# Patient Record
Sex: Female | Born: 1935
Health system: Southern US, Community
[De-identification: ages and names within clinical notes are randomized; demographics above are authoritative.]

## PROBLEM LIST (undated history)

## (undated) DIAGNOSIS — D849 Immunodeficiency, unspecified: Secondary | ICD-10-CM

## (undated) DIAGNOSIS — T8859XA Other complications of anesthesia, initial encounter: Secondary | ICD-10-CM

## (undated) DIAGNOSIS — C801 Malignant (primary) neoplasm, unspecified: Secondary | ICD-10-CM

## (undated) DIAGNOSIS — G7000 Myasthenia gravis without (acute) exacerbation: Secondary | ICD-10-CM

## (undated) DIAGNOSIS — I10 Essential (primary) hypertension: Secondary | ICD-10-CM

## (undated) DIAGNOSIS — M1990 Unspecified osteoarthritis, unspecified site: Secondary | ICD-10-CM

## (undated) DIAGNOSIS — T4145XA Adverse effect of unspecified anesthetic, initial encounter: Secondary | ICD-10-CM

## (undated) HISTORY — PX: BREAST SURGERY: SHX581

## (undated) HISTORY — PX: MASTECTOMY: SHX3

## (undated) HISTORY — PX: OTHER SURGICAL HISTORY: SHX169

## (undated) HISTORY — PX: HEMORRHOID SURGERY: SHX153

## (undated) HISTORY — PX: ABDOMINAL HYSTERECTOMY: SHX81

## (undated) HISTORY — PX: APPENDECTOMY: SHX54

## (undated) HISTORY — PX: EYE SURGERY: SHX253

---

## 1997-12-28 ENCOUNTER — Other Ambulatory Visit: Admission: RE | Admit: 1997-12-28 | Discharge: 1997-12-28 | Payer: Self-pay | Admitting: Hematology and Oncology

## 1998-06-10 ENCOUNTER — Ambulatory Visit (HOSPITAL_COMMUNITY): Admission: RE | Admit: 1998-06-10 | Discharge: 1998-06-10 | Payer: Self-pay | Admitting: Gastroenterology

## 2011-07-10 ENCOUNTER — Other Ambulatory Visit: Payer: Self-pay | Admitting: Family Medicine

## 2011-07-10 DIAGNOSIS — Z1231 Encounter for screening mammogram for malignant neoplasm of breast: Secondary | ICD-10-CM

## 2011-08-06 ENCOUNTER — Other Ambulatory Visit: Payer: Self-pay | Admitting: Family Medicine

## 2011-08-06 ENCOUNTER — Ambulatory Visit
Admission: RE | Admit: 2011-08-06 | Discharge: 2011-08-06 | Disposition: A | Discharge status: Home / Self Care | Payer: Medicare Other | Source: Ambulatory Visit | Attending: Family Medicine | Admitting: Family Medicine

## 2011-08-06 DIAGNOSIS — Z1231 Encounter for screening mammogram for malignant neoplasm of breast: Secondary | ICD-10-CM

## 2012-08-18 ENCOUNTER — Other Ambulatory Visit: Payer: Self-pay | Admitting: *Deleted

## 2012-08-18 ENCOUNTER — Other Ambulatory Visit: Payer: Self-pay | Admitting: Family Medicine

## 2012-08-18 DIAGNOSIS — Z1231 Encounter for screening mammogram for malignant neoplasm of breast: Secondary | ICD-10-CM

## 2012-09-29 ENCOUNTER — Ambulatory Visit: Payer: Medicare Other

## 2012-10-07 ENCOUNTER — Ambulatory Visit: Payer: Medicare Other

## 2012-10-29 ENCOUNTER — Ambulatory Visit: Payer: Medicare Other

## 2012-11-27 ENCOUNTER — Ambulatory Visit
Admission: RE | Admit: 2012-11-27 | Discharge: 2012-11-27 | Disposition: A | Discharge status: Home / Self Care | Payer: Medicare Other | Source: Ambulatory Visit | Attending: Family Medicine | Admitting: Family Medicine

## 2012-11-27 DIAGNOSIS — Z1231 Encounter for screening mammogram for malignant neoplasm of breast: Secondary | ICD-10-CM

## 2013-03-19 ENCOUNTER — Inpatient Hospital Stay (HOSPITAL_COMMUNITY)
Admission: EM | Admit: 2013-03-19 | Discharge: 2013-03-22 | DRG: 683 | Disposition: A | Discharge status: Home / Self Care | Payer: Medicare Other | Attending: Internal Medicine | Admitting: Internal Medicine

## 2013-03-19 ENCOUNTER — Encounter (HOSPITAL_COMMUNITY): Payer: Self-pay | Admitting: *Deleted

## 2013-03-19 DIAGNOSIS — R627 Adult failure to thrive: Secondary | ICD-10-CM | POA: Diagnosis present

## 2013-03-19 DIAGNOSIS — I10 Essential (primary) hypertension: Secondary | ICD-10-CM | POA: Diagnosis present

## 2013-03-19 DIAGNOSIS — R531 Weakness: Secondary | ICD-10-CM

## 2013-03-19 DIAGNOSIS — N390 Urinary tract infection, site not specified: Secondary | ICD-10-CM

## 2013-03-19 DIAGNOSIS — A088 Other specified intestinal infections: Secondary | ICD-10-CM | POA: Diagnosis present

## 2013-03-19 DIAGNOSIS — E860 Dehydration: Secondary | ICD-10-CM

## 2013-03-19 DIAGNOSIS — Z853 Personal history of malignant neoplasm of breast: Secondary | ICD-10-CM

## 2013-03-19 DIAGNOSIS — Z79899 Other long term (current) drug therapy: Secondary | ICD-10-CM

## 2013-03-19 DIAGNOSIS — N179 Acute kidney failure, unspecified: Secondary | ICD-10-CM

## 2013-03-19 DIAGNOSIS — G7000 Myasthenia gravis without (acute) exacerbation: Secondary | ICD-10-CM | POA: Diagnosis present

## 2013-03-19 HISTORY — DX: Essential (primary) hypertension: I10

## 2013-03-19 HISTORY — DX: Unspecified osteoarthritis, unspecified site: M19.90

## 2013-03-19 HISTORY — DX: Myasthenia gravis without (acute) exacerbation: G70.00

## 2013-03-19 HISTORY — DX: Immunodeficiency, unspecified: D84.9

## 2013-03-19 HISTORY — DX: Malignant (primary) neoplasm, unspecified: C80.1

## 2013-03-19 MED ORDER — SODIUM CHLORIDE 0.9 % IV BOLUS (SEPSIS)
1000.0000 mL | Freq: Once | INTRAVENOUS | Status: AC
  Administered 2013-03-20: 1000 mL via INTRAVENOUS

## 2013-03-19 MED ORDER — ONDANSETRON HCL 4 MG/2ML IJ SOLN
4.0000 mg | Freq: Once | INTRAMUSCULAR | Status: AC
  Administered 2013-03-20: 4 mg via INTRAVENOUS
  Filled 2013-03-19: qty 2

## 2013-03-19 NOTE — ED Notes (Signed)
Pt states she has been sick since Tuesday with nausea vomiting, weakness and she has had no care from pcp because it is getting worse today,

## 2013-03-20 ENCOUNTER — Encounter (HOSPITAL_COMMUNITY): Payer: Self-pay | Admitting: *Deleted

## 2013-03-20 ENCOUNTER — Inpatient Hospital Stay (HOSPITAL_COMMUNITY): Payer: Medicare Other

## 2013-03-20 DIAGNOSIS — E860 Dehydration: Secondary | ICD-10-CM

## 2013-03-20 DIAGNOSIS — R531 Weakness: Secondary | ICD-10-CM | POA: Diagnosis present

## 2013-03-20 DIAGNOSIS — N390 Urinary tract infection, site not specified: Secondary | ICD-10-CM

## 2013-03-20 DIAGNOSIS — N179 Acute kidney failure, unspecified: Secondary | ICD-10-CM | POA: Diagnosis present

## 2013-03-20 DIAGNOSIS — G7000 Myasthenia gravis without (acute) exacerbation: Secondary | ICD-10-CM | POA: Diagnosis present

## 2013-03-20 LAB — COMPREHENSIVE METABOLIC PANEL
ALT: 14 U/L (ref 0–35)
AST: 13 U/L (ref 0–37)
Albumin: 3.6 g/dL (ref 3.5–5.2)
Alkaline Phosphatase: 46 U/L (ref 39–117)
Chloride: 104 mEq/L (ref 96–112)
Potassium: 4.6 mEq/L (ref 3.5–5.1)
Total Bilirubin: 0.4 mg/dL (ref 0.3–1.2)

## 2013-03-20 LAB — URINALYSIS, ROUTINE W REFLEX MICROSCOPIC
Bilirubin Urine: NEGATIVE
Glucose, UA: 100 mg/dL — AB
Ketones, ur: NEGATIVE mg/dL
pH: 5.5 (ref 5.0–8.0)

## 2013-03-20 LAB — CBC WITH DIFFERENTIAL/PLATELET
Basophils Absolute: 0 10*3/uL (ref 0.0–0.1)
Basophils Relative: 0 % (ref 0–1)
Hemoglobin: 13.1 g/dL (ref 12.0–15.0)
MCHC: 33.9 g/dL (ref 30.0–36.0)
Neutro Abs: 6.7 10*3/uL (ref 1.7–7.7)
Neutrophils Relative %: 76 % (ref 43–77)
RDW: 13.3 % (ref 11.5–15.5)
WBC: 8.8 10*3/uL (ref 4.0–10.5)

## 2013-03-20 LAB — URINE MICROSCOPIC-ADD ON

## 2013-03-20 LAB — BASIC METABOLIC PANEL
CO2: 21 mEq/L (ref 19–32)
Calcium: 8.5 mg/dL (ref 8.4–10.5)
Chloride: 107 mEq/L (ref 96–112)
GFR calc Af Amer: 15 mL/min — ABNORMAL LOW (ref >90)
Sodium: 138 mEq/L (ref 135–145)

## 2013-03-20 LAB — PHOSPHORUS: Phosphorus: 4.6 mg/dL (ref 2.3–4.6)

## 2013-03-20 LAB — CLOSTRIDIUM DIFFICILE BY PCR: Toxigenic C. Difficile by PCR: NEGATIVE

## 2013-03-20 MED ORDER — LATANOPROST 0.005 % OP SOLN
1.0000 [drp] | Freq: Every day | OPHTHALMIC | Status: DC
  Administered 2013-03-20 – 2013-03-21 (×2): 1 [drp] via OPHTHALMIC
  Filled 2013-03-20: qty 2.5

## 2013-03-20 MED ORDER — ONDANSETRON HCL 4 MG/2ML IJ SOLN: 4.0000 mg | Freq: Four times a day (QID) | INTRAMUSCULAR | Status: DC | PRN

## 2013-03-20 MED ORDER — DEXTROSE 5 % IV SOLN
1.0000 g | Freq: Once | INTRAVENOUS | Status: AC
  Administered 2013-03-20: 1 g via INTRAVENOUS
  Filled 2013-03-20: qty 10

## 2013-03-20 MED ORDER — BIOTENE DRY MOUTH MT LIQD
15.0000 mL | Freq: Two times a day (BID) | OROMUCOSAL | Status: DC
  Administered 2013-03-20 – 2013-03-22 (×4): 15 mL via OROMUCOSAL

## 2013-03-20 MED ORDER — AMLODIPINE BESYLATE 5 MG PO TABS
5.0000 mg | ORAL_TABLET | Freq: Every day | ORAL | Status: DC
  Administered 2013-03-21 – 2013-03-22 (×2): 5 mg via ORAL
  Filled 2013-03-20 (×3): qty 1

## 2013-03-20 MED ORDER — DEXTROSE 5 % IV SOLN
1.0000 g | INTRAVENOUS | Status: AC
  Administered 2013-03-21: 1 g via INTRAVENOUS
  Filled 2013-03-20: qty 10

## 2013-03-20 MED ORDER — VITAMIN D3 25 MCG (1000 UNIT) PO TABS
1000.0000 [IU] | ORAL_TABLET | Freq: Every day | ORAL | Status: DC
  Administered 2013-03-20 – 2013-03-22 (×3): 1000 [IU] via ORAL
  Filled 2013-03-20 (×3): qty 1

## 2013-03-20 MED ORDER — DICLOFENAC SODIUM 1 % TD GEL
2.0000 g | Freq: Every day | TRANSDERMAL | Status: DC | PRN
  Filled 2013-03-20: qty 100

## 2013-03-20 MED ORDER — PYRIDOSTIGMINE BROMIDE 60 MG PO TABS
90.0000 mg | ORAL_TABLET | Freq: Two times a day (BID) | ORAL | Status: DC
  Administered 2013-03-20 – 2013-03-22 (×5): 90 mg via ORAL
  Filled 2013-03-20 (×6): qty 1.5

## 2013-03-20 MED ORDER — SODIUM CHLORIDE 0.9 % IV SOLN
INTRAVENOUS | Status: DC
  Administered 2013-03-20: 12:00:00 via INTRAVENOUS

## 2013-03-20 MED ORDER — ATORVASTATIN CALCIUM 20 MG PO TABS
20.0000 mg | ORAL_TABLET | Freq: Every day | ORAL | Status: DC
  Administered 2013-03-20 – 2013-03-21 (×2): 20 mg via ORAL
  Filled 2013-03-20 (×4): qty 1

## 2013-03-20 MED ORDER — OXYBUTYNIN CHLORIDE 5 MG PO TABS
5.0000 mg | ORAL_TABLET | Freq: Two times a day (BID) | ORAL | Status: DC
  Administered 2013-03-20 – 2013-03-22 (×5): 5 mg via ORAL
  Filled 2013-03-20 (×6): qty 1

## 2013-03-20 MED ORDER — SODIUM CHLORIDE 0.9 % IV BOLUS (SEPSIS)
500.0000 mL | Freq: Once | INTRAVENOUS | Status: AC
  Administered 2013-03-20: 500 mL via INTRAVENOUS

## 2013-03-20 MED ORDER — SODIUM CHLORIDE 0.9 % IJ SOLN
3.0000 mL | Freq: Two times a day (BID) | INTRAMUSCULAR | Status: DC
  Administered 2013-03-20 – 2013-03-22 (×4): 3 mL via INTRAVENOUS

## 2013-03-20 MED ORDER — SIMVASTATIN 40 MG PO TABS
40.0000 mg | ORAL_TABLET | Freq: Every evening | ORAL | Status: DC
  Filled 2013-03-20: qty 1

## 2013-03-20 MED ORDER — ASPIRIN EC 81 MG PO TBEC
81.0000 mg | DELAYED_RELEASE_TABLET | Freq: Every day | ORAL | Status: DC
  Administered 2013-03-20 – 2013-03-22 (×3): 81 mg via ORAL
  Filled 2013-03-20 (×3): qty 1

## 2013-03-20 MED ORDER — ONDANSETRON HCL 4 MG PO TABS: 4.0000 mg | ORAL_TABLET | Freq: Four times a day (QID) | ORAL | Status: DC | PRN

## 2013-03-20 MED ORDER — SENNOSIDES-DOCUSATE SODIUM 8.6-50 MG PO TABS
1.0000 | ORAL_TABLET | ORAL | Status: DC
  Administered 2013-03-22: 1 via ORAL
  Filled 2013-03-20 (×2): qty 1

## 2013-03-20 MED ORDER — CALCIUM CARBONATE ANTACID 500 MG PO CHEW
1.0000 | CHEWABLE_TABLET | Freq: Every evening | ORAL | Status: DC
  Administered 2013-03-20 – 2013-03-21 (×2): 200 mg via ORAL
  Filled 2013-03-20 (×4): qty 1

## 2013-03-20 MED ORDER — ENOXAPARIN SODIUM 30 MG/0.3ML ~~LOC~~ SOLN
30.0000 mg | SUBCUTANEOUS | Status: DC
  Administered 2013-03-20 – 2013-03-22 (×3): 30 mg via SUBCUTANEOUS
  Filled 2013-03-20 (×3): qty 0.3

## 2013-03-20 MED ORDER — PANTOPRAZOLE SODIUM 40 MG PO TBEC
40.0000 mg | DELAYED_RELEASE_TABLET | Freq: Every day | ORAL | Status: DC
  Administered 2013-03-20 – 2013-03-22 (×3): 40 mg via ORAL
  Filled 2013-03-20 (×3): qty 1

## 2013-03-20 NOTE — Care Management Note (Signed)
CARE MANAGEMENT NOTE 03/20/2013  Patient:  Elaine Owens, Elaine Owens   Account Number:  [id number]  Date Initiated:  03/20/2013  Documentation initiated by:  Trystan Akhtar  Subjective/Objective Assessment:   79 yo female admitted with UTI & ARF.     Action/Plan:   Home when stable   Anticipated DC Date:     Anticipated DC Plan:  HOME/SELF CARE      DC Planning Services  CM consult      Choice offered to / List presented to:     DME arranged  NA      DME agency  NA     HH arranged  NA      HH agency  NA   Status of service:  In process, will continue to follow Medicare Important Message given?   (If response is "NO", the following Medicare IM given date fields will be blank) Date Medicare IM given:   Date Additional Medicare IM given:    Discharge Disposition:    Per UR Regulation:  Reviewed for med. necessity/level of care/duration of stay  If discussed at Long Length of Stay Meetings, dates discussed:    Comments:  03/20/13 1200 Cori Henningsen,RN,BSN [phone] chart reviewed for utilization of services. No needs identified at this time.

## 2013-03-20 NOTE — H&P (Addendum)
[facility] History and Physical  Karleigh Bunte YNW:[id number] DOB: [birth year] DOA: 03/19/2013  Referring physician: ED physician PCP: No primary provider on file.   Chief Complaint: Generalized weakness   HPI:  Pt is 79 yo female with PMH outlined below who presents to [facility] ED with main concern of progressively worsening generalized weakness that initially started several weeks prior to admission associated with poor oral intake. Over the past two days she has started to experience nausea and non bloody vomiting, several episodes of non bloody diarrhea, generalized abdominal discomfort. Pt denies any specific alleviating or aggravating factors, no similar events in the past. She explains her abdominal discomfort feels like craps, its intermittent and 5/10 in severity when present, non radiating, associated with subjective fevers and chill. She denies chest pain or shortness of breath, no specific focal neurological symptoms, no other systemic symptoms.   In ED, pt appears stable but frail, UA suggestive of UTI and electrolyte panel with what appears to be be acute renal failure with Cr > 3. We have no other available record in EPIC system to determine baseline. TR[facility] asked to admit to medical floor for further evaluation.   Assessment and Plan:  Principal Problem:   Generalized weakness - this is likely multifactorial and secondary to possible viral gastroenteritis, UTI, deconditioning and failure to thrive  - will admit pt to medical floor for further evaluation - wil provide supportive care with IVF, analgesia and antiemetics as needed - provide ABX for empiric treatment of UTI and follow up on urine culture  - PT evaluation  Active Problems:   Nausea and vomiting, abdominal pain - possibly related to viral gastroenteritis, no flank pain to suggest pyelonephritis but its certainly possible  - treatment supportive as noted above, IVF, antiemetics and analgesia    Acute renal  failure - unclear etiology if this is entirely new problem or pt has chronic kidney disease - could be related to pre renal etiology and secondary to vomiting and poor oral intake  - will proceed with renal US, urine sodium and urine creatinine - will start IVF as noted above and repeat BMP in AM   Myasthenia gravis - continue pyridostigmine    UTI (urinary tract infection) - Rocephin for now and follow up on urine culture   Code Status: Full Family Communication: No family at bedside  Disposition Plan: Admit to telemetry bed   Review of Systems:  Constitutional: Positive  for fever, chills and malaise/fatigue. Negative for diaphoresis.  HENT: Negative for hearing loss, ear pain, nosebleeds, congestion, sore throat, neck pain, tinnitus and ear discharge.   Eyes: Negative for blurred vision, double vision, photophobia, pain, discharge and redness.  Respiratory: Negative for cough, hemoptysis, sputum production, shortness of breath, wheezing and stridor.   Cardiovascular: Negative for chest pain, palpitations, orthopnea, claudication and leg swelling.  Gastrointestinal: Positive for nausea, vomiting and abdominal pain. Negative for heartburn, constipation, blood in stool and melena.  Genitourinary: Negative for dysuria, urgency, frequency, hematuria and flank pain.  Musculoskeletal: Negative for myalgias, joint pain and falls.  Skin: Negative for itching and rash.  Neurological: Negative for tingling, tremors, sensory change, speech change, focal weakness, loss of consciousness and headaches.  Endo/Heme/Allergies: Negative for environmental allergies and polydipsia. Does not bruise/bleed easily.  Psychiatric/Behavioral: Negative for suicidal ideas. The patient is not nervous/anxious.      Past Medical History  Diagnosis Date  . Arthritis   . Cancer   . Hypertension   . Immune deficiency disorder  Past Surgical History  Procedure Laterality Date  . Masectomny    . Breast  surgery    . Appendectomy    . Eye surgery      lens implant  . Mastectomy      Social History:  reports that she has never smoked. She does not have any smokeless tobacco history on file. She reports that she does not drink alcohol. Her drug history is not on file.  Allergies  Allergen Reactions  . Ciprofloxacin Other (See Comments)    Can not take if patient has myastenia gravis  . Doxycycline Hyclate Other (See Comments)    Patient preference  . Morphine And Related Nausea And Vomiting    Mad her extremely sick    No family history of cancers.   Prior to Admission medications   Medication Sig Start Date End Date Taking? Authorizing Provider  alendronate (FOSAMAX) 70 MG tablet Take 70 mg by mouth every 7 (seven) days. Sunday. Take with a full glass of water on an empty stomach.   Yes Historical Provider, MD  amLODipine (NORVASC) 5 MG tablet Take 5 mg by mouth daily.   Yes Historical Provider, MD  aspirin EC 81 MG tablet Take 81 mg by mouth daily.   Yes Historical Provider, MD  calcium carbonate (TUMS - DOSED IN MG ELEMENTAL CALCIUM) 500 MG chewable tablet Chew 1 tablet by mouth every evening.   Yes Historical Provider, MD  cholecalciferol (VITAMIN D) 1000 UNITS tablet Take 1,000 Units by mouth daily.   Yes Historical Provider, MD  cyanocobalamin (,VITAMIN B-12,) 1000 MCG/ML injection Inject 1,000 mcg into the muscle every 30 (thirty) days.   Yes Historical Provider, MD  diclofenac sodium (VOLTAREN) 1 % GEL Apply 2 g topically daily as needed. For pain   Yes Historical Provider, MD  DiphenhydrAMINE HCl, Sleep, (ZZZQUIL PO) Take 1 capsule by mouth at bedtime as needed. For sleep   Yes Historical Provider, MD  latanoprost (XALATAN) 0.005 % ophthalmic solution Place 1 drop into both eyes at bedtime.   Yes Historical Provider, MD  omega-3 acid ethyl esters (LOVAZA) 1 G capsule Take 1 g by mouth daily.   Yes Historical Provider, MD  omeprazole (PRILOSEC) 20 MG capsule Take 20 mg by mouth  daily.   Yes Historical Provider, MD  oxybutynin (DITROPAN) 5 MG tablet Take 5 mg by mouth 2 (two) times daily.   Yes Historical Provider, MD  pyridostigmine (MESTINON) 60 MG tablet Take 90 mg by mouth 2 (two) times daily.   Yes Historical Provider, MD  senna-docusate (SENNA PLUS) 8.6-50 MG per tablet Take 1 tablet by mouth every other day.   Yes Historical Provider, MD  simvastatin (ZOCOR) 40 MG tablet Take 40 mg by mouth every evening.   Yes Historical Provider, MD  valACYclovir (VALTREX) 1000 MG tablet Take 1,000 mg by mouth every evening.   Yes Historical Provider, MD    Physical Exam: Filed Vitals:   03/20/13 0021 03/20/13 0023 03/20/13 0024 03/20/13 0516  BP: 135/51 144/55 144/55 128/48  Pulse: 60 70 69 54  Temp:      TempSrc:      Resp:    18  Height:      Weight:      SpO2:    95%    Physical Exam  Constitutional: Appears to be stable and in no acute distress HENT: Normocephalic. External right and left ear normal. Dry MM Eyes: Conjunctivae and EOM are normal. PERRLA, no scleral icterus.  Neck:  Normal ROM. Neck supple. No JVD. No tracheal deviation. No thyromegaly.  CVS: RRR, S1/S2 +, no murmurs, no gallops, no carotid bruit.  Pulmonary: Effort and breath sounds normal, decreased breath sounds at bases  Abdominal: Soft. BS +,  no distension, tenderness, rebound or guarding.  Musculoskeletal: Normal range of motion. No edema and no tenderness.  Lymphadenopathy: No lymphadenopathy noted, cervical, inguinal. Neuro: Alert. Normal reflexes, muscle tone coordination. No cranial nerve deficit. Skin: Skin is warm and dry. No rash noted. Not diaphoretic. No erythema. No pallor.  Psychiatric: Normal mood and affect.   Labs on Admission:  Basic Metabolic Panel:  Recent Labs Lab 03/20/13 0100  NA 139  K 4.6  CL 104  CO2 23  GLUCOSE 126*  BUN 35*  CREATININE 3.56*  CALCIUM 10.0   Liver Function Tests:  Recent Labs Lab 03/20/13 0100  AST 13  ALT 14  ALKPHOS 46   BILITOT 0.4  PROT 7.0  ALBUMIN 3.6    Recent Labs Lab 03/20/13 0100  LIPASE 32   CBC:  Recent Labs Lab 03/20/13 0100  WBC 8.8  NEUTROABS 6.7  HGB 13.1  HCT 38.6  MCV 92.3  PLT 194   Radiological Exams on Admission: No results found.  EKG: Normal sinus rhythm, no ST/T wave changes  Debbora Presto, MD  [facility] Pager [phone]  If 7PM-7AM, please contact night-coverage [email or web address] Password [email or web address] 03/20/2013, 6:06 AM

## 2013-03-20 NOTE — ED Provider Notes (Signed)
History    CSN: [id number] Arrival date & time 03/19/13  2241  First MD Initiated Contact with Patient 03/19/13 2343     Chief Complaint  Patient presents with  . Emesis  . Weakness  . Dizziness    Patient is a 79 y.o. female presenting with weakness. The history is provided by the patient.  Weakness This is a new problem. The current episode started 2 days ago. The problem occurs constantly. The problem has been gradually worsening. Associated symptoms include abdominal pain. Pertinent negatives include no chest pain and no shortness of breath. Nothing aggravates the symptoms. Nothing relieves the symptoms. She has tried rest for the symptoms. The treatment provided mild relief.  pt reports she has had nonbloody vomiting/diarrhea for past 2 days She also reports fatigue, mild abdominal cramping, and back pain No focal weakness No syncope No cp/sob No melena or BRBPR She reports mild HA No fever at home  She reports h/o myasthenia gravis but no recent issues with this Past Medical History  Diagnosis Date  . Arthritis   . Cancer   . Hypertension   . Immune deficiency disorder    Past Surgical History  Procedure Laterality Date  . Masectomny    . Breast surgery    . Appendectomy    . Eye surgery      lens implant  . Mastectomy     History reviewed. No pertinent family history. History  Substance Use Topics  . Smoking status: Never Smoker   . Smokeless tobacco: Not on file  . Alcohol Use: No   OB History   Grav Para Term Preterm Abortions TAB SAB Ect Mult Living                 Review of Systems  Constitutional: Positive for fatigue. Negative for fever.  HENT: Negative for trouble swallowing.   Respiratory: Negative for cough and shortness of breath.   Cardiovascular: Negative for chest pain.  Gastrointestinal: Positive for abdominal pain. Negative for blood in stool.  Genitourinary: Positive for dysuria.  Musculoskeletal: Positive for back pain.   Neurological: Positive for weakness. Negative for syncope.  All other systems reviewed and are negative.    Allergies  Doxycycline hyclate and Morphine and related  Home Medications  No current outpatient prescriptions on file. BP 148/65  Pulse 69  Temp(Src) 99 F (37.2 C) (Oral)  Resp 16  Ht 5\' 2"  (1.575 m)  Wt 137 lb 2 oz (62.199 kg)  BMI 25.07 kg/m2  SpO2 96% Physical Exam CONSTITUTIONAL: Well developed/well nourished HEAD: Normocephalic/atraumatic EYES: EOMI/PERRL ENMT: Mucous membranes dry NECK: supple no meningeal signs SPINE:entire spine nontender CV: S1/S2 noted, no murmurs/rubs/gallops noted LUNGS: Lungs are clear to auscultation bilaterally, no apparent distress ABDOMEN: soft, nontender, no rebound or guarding GU:no cva tenderness NEURO: Pt is awake/alert, moves all extremitiesx4, no arm or leg drift No facial droop.  No ptosis noted EXTREMITIES: pulses normal, full ROM SKIN: warm, color normal PSYCH: no abnormalities of mood noted  ED Course  Procedures Labs Reviewed  CBC WITH DIFFERENTIAL  COMPREHENSIVE METABOLIC PANEL  LIPASE, BLOOD  URINALYSIS, ROUTINE W REFLEX MICROSCOPIC   12:51 AM Pt here for vomiting, diarrhea and generalized weaknesss She appears mildly dehydrated but no other acute issue at this time Will rehydrate, check labs and reassess 4:06 AM Acute renal failure and UTI noted Will admit Pt has no known h/o renal failure Likely due to recent volume loss from GI illness D/w dr Izola Price, will admit  MDM  Nursing notes including past medical history and social history reviewed and considered in documentation Labs/vital reviewed and considered     Date: 03/20/2013  Rate: 60  Rhythm: normal sinus rhythm  QRS Axis: left  Intervals: normal  ST/T Wave abnormalities: nonspecific ST changes  Conduction Disutrbances:none      Joya Gaskins, MD 03/20/13 [id number]

## 2013-03-20 NOTE — Progress Notes (Signed)
[facility] PROGRESS NOTE  Elaine Owens ZOX:[id number] DOB: 25-Jul-1926 DOA: 03/19/2013 PCP: No primary provider on file.  Assessment/Plan: Principal Problem:   Generalized weakness Active Problems:   Acute renal failure   Myasthenia gravis   UTI (urinary tract infection)    1. Generalized weakness: Patient presented with progressive generalized weakness, and was found to have dehydration, ARF and a UTI, in the setting of known myasthenia gravis, all, likely contributory to a multifactorial etiology. For details of specific management, of contributory conditions, see below. PT/OT input requested.  2. Acute Gastroenteritis: Over the prior 2 days, patient has had nausea, vomiting, abdominal pain, without antecedent ingestion of unusual foods, clustering of cases, sick contacts, recent antibiotic therapy or exotic travel. Managing with iv fluids, antiemetics, and she has had no vomiting since admission. As a matter of fact, tolerated regular diet this morning. She still has loose stools. Suspect a viral etiology, but will check stool for C. Difficile PCR. Lipase is normal at 32.  3. Acute renal failure: Creatinine was elevated at 3.56 , BUN 35 at presentation, which given recent poor oral intake and GI losses, is likely pre-renal, due to dehydration and volume depletion. Managing with iv fluids and following renal indices. Renal U./S is pending. Baseline creatinine is unknown.  4. UTI (urinary tract infection): U/A revealed a positive sediment, with pyuria and bacteriuria, consistent with UTI. Patient is afebrile, and wcc is normal. Managing with iv Rocephin, now day # 2. Urine culture is pending.  5. Myasthenia gravis: Appears stable on Pyridostigmine   Code Status: Full Code.  Family Communication:  Disposition Plan: To be determined.    Brief narrative: 79 yo female with PMH of Myasthenia Gravis, OA, HTN, breast cancer, s/p mastectomy, s/p lens implant, s/p appendectomy, who  presents to [facility] ED with progressively worsening generalized weakness that initially started several weeks prior to admission, associated with poor oral intake. Over the past two days she has had nausea, vomiting, several episodes of non-bloody diarrhea and generalized abdominal discomfort. Pt denies any specific alleviating or aggravating factors, and has had no similar events in the past. She explains her abdominal discomfort feels like craps, it, is intermittent and 5/10 in severity when present, non radiating, associated with subjective fevers and chill. She denies chest pain or shortness of breath, no specific focal neurological symptoms, no other systemic symptoms.  In ED, pt appeared stable but frail, U/A suggestive of UTI and electrolyte panel revealed acute renal failure with Cr > 3 (baseline creatinine is unknown). Admitted for further evaluation and management.    Consultants:  N/A.   Procedures:  N/A.   Antibiotics:  Rocephin 03/19/13>>>  HPI/Subjective: Feels much better. No vomiting or abdominal pain. Stool is loose. Tolerated breakfast.   Objective: Vital signs in last 24 hours: Temp:  [98.1 F (36.7 C)-99 F (37.2 C)] 98.1 F (36.7 C) (07/04 0609) Pulse Rate:  [54-70] 66 (07/04 0615) Resp:  [16-18] 18 (07/04 0609) BP: (90-148)/(48-65) 90/64 mmHg (07/04 0609) SpO2:  [95 %-98 %] 98 % (07/04 0609) Weight:  [62.199 kg (137 lb 2 oz)-66.134 kg (145 lb 12.8 oz)] 66.134 kg (145 lb 12.8 oz) (07/04 0609) Weight change:  Last BM Date: 03/19/13  Intake/Output from previous day:       Physical Exam: General: Comfortable, alert, communicative, fully oriented, not short of breath at rest.  HEENT:  No clinical pallor, no jaundice, no conjunctival injection or discharge. Hydration is fair.  NECK:  Supple, JVP not seen, no  carotid bruits, no palpable lymphadenopathy, no palpable goiter. CHEST:  Clinically clear to auscultation, no wheezes, no crackles. HEART:  Sounds 1 and 2  heard, normal, regular, no murmurs. ABDOMEN:  Full, soft, non-tender, no palpable organomegaly, no palpable masses, normal bowel sounds. GENITALIA:  Not examined. LOWER EXTREMITIES:  No pitting edema, palpable peripheral pulses. MUSCULOSKELETAL SYSTEM:  Generalized osteoarthritic changes, otherwise, normal. CENTRAL NERVOUS SYSTEM:  No focal neurologic deficit on gross examination.  Lab Results:  Recent Labs  03/20/13 0100  WBC 8.8  HGB 13.1  HCT 38.6  PLT 194    Recent Labs  03/20/13 0100  NA 139  K 4.6  CL 104  CO2 23  GLUCOSE 126*  BUN 35*  CREATININE 3.56*  CALCIUM 10.0   No results found for this or any previous visit (from the past 240 hour(s)).   Studies/Results: No results found.  Medications: Scheduled Meds: . amLODipine  5 mg Oral Daily  . aspirin EC  81 mg Oral Daily  . calcium carbonate  1 tablet Oral QPM  . [START ON 03/21/2013] cefTRIAXone (ROCEPHIN)  IV  1 g Intravenous Q24H  . cholecalciferol  1,000 Units Oral Daily  . enoxaparin (LOVENOX) injection  30 mg Subcutaneous Q24H  . latanoprost  1 drop Both Eyes QHS  . oxybutynin  5 mg Oral BID  . pantoprazole  40 mg Oral Daily  . pyridostigmine  90 mg Oral BID  . senna-docusate  1 tablet Oral QODAY  . simvastatin  40 mg Oral QPM  . sodium chloride  3 mL Intravenous Q12H   Continuous Infusions: . sodium chloride 75 mL/hr at 03/20/13 0615   PRN Meds:.diclofenac sodium, ondansetron (ZOFRAN) IV, ondansetron    LOS: 1 day   Elaine Owens,CHRISTOPHER  [facility] Pager [phone]. If 8PM-8AM, please contact night-coverage at [email or web address], password [facility] 03/20/2013, 7:52 AM  LOS: 1 day

## 2013-03-20 NOTE — Evaluation (Signed)
Occupational Therapy Evaluation Patient Details Name: Elaine Owens MRN: [id number] DOB: 1926/04/24 Today's Date: 03/20/2013 Time: 9562-1308 OT Time Calculation (min): 14 min  OT Assessment / Plan / Recommendation History of present illness admitted with generalized weakness, FTT, possible viral gastroenteritis.  H/o breast CA, immune deficiency and myastenia gravis   Clinical Impression   Pt was admitted with the above conditions.  She will benefit from skilled OT in acute with modified independent level goals.  Pt is independent at baseline.  She lives with her daughter and will have intermittent supervision/assistance.    OT Assessment  Patient needs continued OT Services    Follow Up Recommendations  No OT follow up    Barriers to Discharge      Equipment Recommendations  None recommended by OT    Recommendations for Other Services    Frequency  Min 2X/week    Precautions / Restrictions Precautions Precautions: Fall Restrictions Weight Bearing Restrictions: No   Pertinent Vitals/Pain No c/o pain    ADL  Grooming: Teeth care;Modified independent Where Assessed - Grooming: Unsupported standing Upper Body Bathing: Set up Where Assessed - Upper Body Bathing: Unsupported sitting Lower Body Bathing: Supervision/safety Where Assessed - Lower Body Bathing: Unsupported sit to stand Upper Body Dressing: Set up Where Assessed - Upper Body Dressing: Unsupported sitting Lower Body Dressing: Supervision/safety Where Assessed - Lower Body Dressing: Unsupported sit to stand Toilet Transfer: Min Pension scheme manager Method: Sit to Barista: Comfort height toilet Toileting - Clothing Manipulation and Hygiene: Supervision/safety Where Assessed - Engineer, mining and Hygiene: Sit to stand from 3-in-1 or toilet Equipment Used:  (used IV pole for support out of bathroom) Transfers/Ambulation Related to ADLs: ambulated to bathroom with min  guard.  Used IV pole on way back and more steady.  Pt just feels a little deconditioned but feels better than yesterday ADL Comments: began energy conservation education    OT Diagnosis: Generalized weakness  OT Problem List: Decreased strength;Decreased activity tolerance;Impaired balance (sitting and/or standing) OT Treatment Interventions: Self-care/ADL training;Energy conservation;Patient/family education;Balance training   OT Goals(Current goals can be found in the care plan section) Acute Rehab OT Goals Patient Stated Goal: get back to being independent OT Goal Formulation: With patient Time For Goal Achievement: 04/03/13 Potential to Achieve Goals: Good ADL Goals Pt Will Transfer to Toilet: with modified independence;ambulating;regular height toilet Pt Will Perform Tub/Shower Transfer: with supervision;ambulating;shower seat;Tub transfer;grab bars Additional ADL Goal #1: Pt will gather clothes at mod I level Additional ADL Goal #2: Pt will verbalize 3 energy conservation strategies  Visit Information  Last OT Received On: 03/20/13 Assistance Needed: +1 History of Present Illness: admitted with generalized weakness, FTT, possible viral gastroenteritis.  H/o breast CA, immune deficiency and myastenia gravis       Prior Functioning     Home Living Family/patient expects to be discharged to:: Private residence Living Arrangements: Children Available Help at Discharge: Family;Available PRN/intermittently Type of Home: House Home Access: Level entry Home Layout: Two level;Bed/bath upstairs Alternate Level Stairs-Number of Steps: 12 Alternate Level Stairs-Rails: Right Home Equipment: Cane - single point;Shower seat (canes from husband) Prior Function Level of Independence: Independent Comments: she does some cooking; daughter does some Communication Communication: No difficulties         Vision/Perception     Cognition  Cognition Arousal/Alertness:  Awake/alert Behavior During Therapy: WFL for tasks assessed/performed Overall Cognitive Status: Within Functional Limits for tasks assessed    Extremity/Trunk Assessment Upper Extremity Assessment Upper Extremity  Assessment: Overall WFL for tasks assessed (pt has a h/o myastenia gravis)     Mobility Bed Mobility Bed Mobility: Supine to Sit Supine to Sit: 6: Modified independent (Device/Increase time);HOB elevated Transfers Transfers: Sit to Stand Sit to Stand: 5: Supervision     Exercise     Balance Balance Balance Assessed: Yes Dynamic Standing Balance Dynamic Standing - Balance Support: No upper extremity supported Dynamic Standing - Level of Assistance: 5: Stand by assistance Dynamic Standing - Balance Activities:  (ambulating to bathroom)   End of Session OT - End of Session Activity Tolerance: Patient tolerated treatment well Patient left: in chair;with bed alarm set;with family/visitor present  GO     [name] 03/20/2013, 2:25 PM Marica Otter, OTR/L [phone] 03/20/2013

## 2013-03-20 NOTE — Progress Notes (Addendum)
Patient admitted to room 1315 from ED. Denies nausea. "feeling better" per patient. Oriented to room and unit. Safety video in progress. Paged MD about order for telemetry. (orders just showed up in computer). Dr Izola Price to d/c orders. Not needed.

## 2013-03-20 NOTE — ED Notes (Signed)
Labs accidentally clicked off at this time. RN made aware that blood work was not obtained.

## 2013-03-20 NOTE — Progress Notes (Signed)
Patient requested LW/HCPOA papers. Packet given.

## 2013-03-21 DIAGNOSIS — R5381 Other malaise: Secondary | ICD-10-CM

## 2013-03-21 DIAGNOSIS — G7000 Myasthenia gravis without (acute) exacerbation: Secondary | ICD-10-CM

## 2013-03-21 DIAGNOSIS — R5383 Other fatigue: Secondary | ICD-10-CM

## 2013-03-21 LAB — CBC
MCV: 94.2 fL (ref 78.0–100.0)
Platelets: 150 10*3/uL (ref 150–400)
RBC: 3.99 MIL/uL (ref 3.87–5.11)
RDW: 13.6 % (ref 11.5–15.5)
WBC: 7.4 10*3/uL (ref 4.0–10.5)

## 2013-03-21 LAB — BASIC METABOLIC PANEL WITH GFR
BUN: 34 mg/dL — ABNORMAL HIGH (ref 6–23)
CO2: 19 meq/L (ref 19–32)
Calcium: 8.8 mg/dL (ref 8.4–10.5)
Chloride: 111 meq/L (ref 96–112)
Creatinine, Ser: 3.06 mg/dL — ABNORMAL HIGH (ref 0.50–1.10)
GFR calc Af Amer: 15 mL/min — ABNORMAL LOW
GFR calc non Af Amer: 13 mL/min — ABNORMAL LOW
Glucose, Bld: 92 mg/dL (ref 70–99)
Potassium: 4.4 meq/L (ref 3.5–5.1)
Sodium: 140 meq/L (ref 135–145)

## 2013-03-21 MED ORDER — CEPHALEXIN 500 MG PO CAPS
500.0000 mg | ORAL_CAPSULE | Freq: Two times a day (BID) | ORAL | Status: DC
  Administered 2013-03-22: 500 mg via ORAL
  Filled 2013-03-21 (×2): qty 1

## 2013-03-21 NOTE — Evaluation (Signed)
Physical Therapy Evaluation Patient Details Name: Elaine Owens MRN: [id number] DOB: June 08, 1926 Today's Date: 03/21/2013 Time: 4098-1191 PT Time Calculation (min): 19 min  PT Assessment / Plan / Recommendation History of Present Illness  admitted with generalized weakness, FTT, possible viral gastroenteritis.  H/o breast CA, immune deficiency and myastenia gravis  Clinical Impression  Pt ambulating in halls and performing all mobility tasks without assist.  Pt able to walk bkwd, side step and single leg stance with min difficulty.  Pt reports at premorbid level of function at this time and hoping for d/c home in am.  Pt has no further PT needs at this time.    PT Assessment  Patent does not need any further PT services    Follow Up Recommendations  No PT follow up    Does the patient have the potential to tolerate intense rehabilitation      Barriers to Discharge        Equipment Recommendations  None recommended by PT    Recommendations for Other Services     Frequency      Precautions / Restrictions Precautions Precautions: Fall Restrictions Weight Bearing Restrictions: No   Pertinent Vitals/Pain No pain reported      Mobility  Bed Mobility Bed Mobility: Supine to Sit Supine to Sit: 7: Independent Transfers Transfers: Sit to Stand;Stand to Sit Sit to Stand: 6: Modified independent (Device/Increase time) Stand to Sit: 6: Modified independent (Device/Increase time) Ambulation/Gait Ambulation/Gait Assistance: 5: Supervision Ambulation Distance (Feet): 300 Feet Assistive device: None Ambulation/Gait Assistance Details: Min instability noted - pt states she generally wears shoes. Gait Pattern: Within Functional Limits Stairs: No    Exercises     PT Diagnosis:    PT Problem List:   PT Treatment Interventions:       PT Goals(Current goals can be found in the care plan section) Acute Rehab PT Goals Patient Stated Goal: get back to being  independent  Visit Information  Last PT Received On: 03/21/13 Assistance Needed: +1 History of Present Illness: admitted with generalized weakness, FTT, possible viral gastroenteritis.  H/o breast CA, immune deficiency and myastenia gravis       Prior Functioning  Home Living Family/patient expects to be discharged to:: Private residence Living Arrangements: Children Available Help at Discharge: Family;Available PRN/intermittently Type of Home: House Home Access: Level entry Home Layout: Two level;Bed/bath upstairs Alternate Level Stairs-Number of Steps: 12 Alternate Level Stairs-Rails: Right Home Equipment: Cane - single point;Shower seat Prior Function Level of Independence: Independent Comments: she does some cooking; daughter does some; very active Communication Communication: No difficulties    Cognition  Cognition Arousal/Alertness: Awake/alert Behavior During Therapy: WFL for tasks assessed/performed Overall Cognitive Status: Within Functional Limits for tasks assessed    Extremity/Trunk Assessment Upper Extremity Assessment Upper Extremity Assessment: Overall WFL for tasks assessed Lower Extremity Assessment Lower Extremity Assessment: Overall WFL for tasks assessed Cervical / Trunk Assessment Cervical / Trunk Assessment: Normal   Balance Balance Balance Assessed: Yes Static Standing Balance Static Standing - Balance Support: No upper extremity supported Static Standing - Level of Assistance: 5: Stand by assistance Static Standing - Comment/# of Minutes: 1 Single Leg Stance - Right Leg: 10 Single Leg Stance - Left Leg: 10 Rhomberg - Eyes Opened: 1 Rhomberg - Eyes Closed: 1  End of Session PT - End of Session Equipment Utilized During Treatment: Gait belt Activity Tolerance: Patient tolerated treatment well Patient left: in chair;with call bell/phone within reach Nurse Communication: Mobility status  GP  Elaine Owens 03/21/2013, 3:40 PM

## 2013-03-21 NOTE — Discharge Summary (Signed)
Physician Discharge Summary  Elaine Owens ZOX:[id number] DOB: December 18, 1925 DOA: 03/19/2013  PCP: No primary provider on file.  Admit date: 03/19/2013 Discharge date: 03/22/2013  Time spent: 40 minutes  Recommendations for Outpatient Follow-up:  1. Follow up with primary MD. PMD is Dr Hope Budds, at [place], [place].  2. Primary MD to arrange nephrology referral, at her own discretion, if indicated.  3. Valtrex has been discontinued in view of ARF, until re-evaluated by PMD.   Discharge Diagnoses:  Principal Problem:   Generalized weakness Active Problems:   Acute renal failure   Myasthenia gravis   UTI (urinary tract infection)   Discharge Condition: Satisfactory.   Diet recommendation: Regular.   Filed Weights   03/19/13 2258 03/20/13 0609 03/22/13 0500  Weight: 62.199 kg (137 lb 2 oz) 66.134 kg (145 lb 12.8 oz) 64.774 kg (142 lb 12.8 oz)    History of present illness:  79 yo female with PMH of Myasthenia Gravis, OA, HTN, breast cancer, s/p mastectomy, s/p lens implant, s/p appendectomy, who presents to [facility] ED with progressively worsening generalized weakness that initially started several weeks prior to admission, associated with poor oral intake. Over the past two days she has had nausea, vomiting, several episodes of non-bloody diarrhea and generalized abdominal discomfort. Pt denies any specific alleviating or aggravating factors, and has had no similar events in the past. She explains her abdominal discomfort feels like craps, it, is intermittent and 5/10 in severity when present, non radiating, associated with subjective fevers and chill. She denies chest pain or shortness of breath, no specific focal neurological symptoms, no other systemic symptoms.  In ED, pt appeared stable but frail, U/A suggestive of UTI and electrolyte panel revealed acute renal failure with Cr > 3 (baseline creatinine is unknown). Admitted for further evaluation and management.    Hospital Course:   1. Generalized weakness: Patient presented with progressive generalized weakness, and was found to have dehydration, ARF and a UTI, in the setting of known myasthenia gravis, all, likely contributory to a multifactorial etiology. For details of specific management, of contributory conditions, see below. PT/OT evaluation has identified no acute needs.  2. Acute Gastroenteritis: Over the 2 days prior to admission, patient had nausea, vomiting, abdominal pain, without antecedent ingestion of unusual foods, clustering of cases, sick contacts, recent antibiotic therapy or exotic travel. Managed with iv fluidsn and antiemetics, with satisfactory clinical response. She had no further vomiting since admission, and although there were some loose stools in AM 03/20/13, diarrhea resolved, and patient was asymptomatic as of 03/21/13. C. Difficile PCR was negative. Lipase was normal at 32. This appears to have been a self-limited viral gastroenteritis.  3. Acute renal failure: Creatinine was elevated at 3.56, BUN 35 at presentation, which given recent poor oral intake and GI losses, was likely pre-renal in etiology, due to dehydration and volume depletion. Renal U/S was negative. Baseline creatinine is unknown. Managed with iv fluids, with improvement, and as of 03/22/13, creatinine was 2.02. Perhaps, this is probably patient's true baseline. Patient will need close follow up of renal indices by PMD at discharge, as well as a nephrology referral. Valtrex had been discontinued till re-evaluated by PMD.  4. UTI (urinary tract infection): U/A revealed a positive sediment, with pyuria and bacteriuria, consistent with UTI. Managed with iv Rocephin, patient remained afebrile, and wcc, normal throughout her hospitalization. Urine culture remained negative, and she was transitioned to oral Keflex effective 03/22/13, to conclude antibiotic course on 03/25/13. 5. Myasthenia gravis: Stable on Pyridostigmine  Procedures:  See Below.    Consultations:  N/A.   Discharge Exam: Filed Vitals:   03/21/13 0415 03/21/13 1330 03/21/13 2100 03/22/13 0500  BP: 131/49 116/50 130/54 128/60  Pulse: 52 66 69 51  Temp: 98.1 F (36.7 C) 98 F (36.7 C) 98 F (36.7 C) 99.1 F (37.3 C)  TempSrc: Oral  Oral Oral  Resp: 16 14 16 18   Height:      Weight:    64.774 kg (142 lb 12.8 oz)  SpO2: 97% 99% 97% 97%    General: Comfortable, alert, communicative, fully oriented, not short of breath at rest.  HEENT: No clinical pallor, no jaundice, no conjunctival injection or discharge. Hydration is fair.  NECK: Supple, JVP not seen, no carotid bruits, no palpable lymphadenopathy, no palpable goiter.  CHEST: Clinically clear to auscultation, no wheezes, no crackles.  HEART: Sounds 1 and 2 heard, normal, regular, no murmurs.  ABDOMEN: Full, soft, non-tender, no palpable organomegaly, no palpable masses, normal bowel sounds.  GENITALIA: Not examined.  LOWER EXTREMITIES: No pitting edema, palpable peripheral pulses.  MUSCULOSKELETAL SYSTEM: Generalized osteoarthritic changes, otherwise, normal.  CENTRAL NERVOUS SYSTEM: No focal neurologic deficit on gross examination.  Discharge Instructions      Discharge Orders   Future Orders Complete By Expires     Diet - low sodium heart healthy  As directed     Increase activity slowly  As directed         Medication List    STOP taking these medications       valACYclovir 1000 MG tablet  Commonly known as:  VALTREX      TAKE these medications       alendronate 70 MG tablet  Commonly known as:  FOSAMAX  Take 70 mg by mouth every 7 (seven) days. Sunday. Take with a full glass of water on an empty stomach.     amLODipine 5 MG tablet  Commonly known as:  NORVASC  Take 5 mg by mouth daily.     aspirin EC 81 MG tablet  Take 81 mg by mouth daily.     calcium carbonate 500 MG chewable tablet  Commonly known as:  TUMS - dosed in mg elemental calcium  Chew 1 tablet by mouth every  evening.     cephALEXin 500 MG capsule  Commonly known as:  KEFLEX  Take 1 capsule (500 mg total) by mouth 2 (two) times daily.     cholecalciferol 1000 UNITS tablet  Commonly known as:  VITAMIN D  Take 1,000 Units by mouth daily.     cyanocobalamin 1000 MCG/ML injection  Commonly known as:  (VITAMIN B-12)  Inject 1,000 mcg into the muscle every 30 (thirty) days.     diclofenac sodium 1 % Gel  Commonly known as:  VOLTAREN  Apply 2 g topically daily as needed. For pain     latanoprost 0.005 % ophthalmic solution  Commonly known as:  XALATAN  Place 1 drop into both eyes at bedtime.     omega-3 acid ethyl esters 1 G capsule  Commonly known as:  LOVAZA  Take 1 g by mouth daily.     omeprazole 20 MG capsule  Commonly known as:  PRILOSEC  Take 20 mg by mouth daily.     oxybutynin 5 MG tablet  Commonly known as:  DITROPAN  Take 5 mg by mouth 2 (two) times daily.     pyridostigmine 60 MG tablet  Commonly known as:  MESTINON  Take 90  mg by mouth 2 (two) times daily.     SENNA PLUS 8.6-50 MG per tablet  Generic drug:  senna-docusate  Take 1 tablet by mouth every other day.     simvastatin 40 MG tablet  Commonly known as:  ZOCOR  Take 40 mg by mouth every evening.     ZZZQUIL PO  Take 1 capsule by mouth at bedtime as needed. For sleep       Allergies  Allergen Reactions  . Ciprofloxacin Other (See Comments)    Can not take if patient has myastenia gravis  . Doxycycline Hyclate Other (See Comments)    Patient preference  . Morphine And Related Nausea And Vomiting    Mad her extremely sick   Follow-up Information   Please follow up. (Follow up with primary MD. )        The results of significant diagnostics from this hospitalization (including imaging, microbiology, ancillary and laboratory) are listed below for reference.    Significant Diagnostic Studies: US Renal  03/20/2013   *RADIOLOGY REPORT*  Clinical Data: Acute renal failure  RENAL/URINARY TRACT  ULTRASOUND COMPLETE  Comparison:  None.  Findings:  Right Kidney:  Measures 11.9 cm.  No mass or hydronephrosis.  Left Kidney:  Measures 12.3 cm.  No mass or hydronephrosis.  Bladder:  Within normal limits.  IMPRESSION: Negative renal ultrasound.   Original Report Authenticated By: Charline Bills, M.D.    Microbiology: Recent Results (from the past 240 hour(s))  CLOSTRIDIUM DIFFICILE BY PCR     Status: None   Collection Time    03/20/13  1:15 PM      Result Value Range Status   C difficile by pcr NEGATIVE  NEGATIVE Final     Labs: Basic Metabolic Panel:  Recent Labs Lab 03/20/13 0100 03/20/13 0705 03/20/13 1115 03/21/13 0424 03/22/13 0501  NA 139  --  138 140 144  K 4.6  --  3.8 4.4 3.9  CL 104  --  107 111 113*  CO2 23  --  21 19 22   GLUCOSE 126*  --  112* 92 105*  BUN 35*  --  33* 34* 28*  CREATININE 3.56*  --  3.05* 3.06* 2.02*  CALCIUM 10.0  --  8.5 8.8 8.7  MG  --  2.3  --   --   --   PHOS  --  4.6  --   --   --    Liver Function Tests:  Recent Labs Lab 03/20/13 0100  AST 13  ALT 14  ALKPHOS 46  BILITOT 0.4  PROT 7.0  ALBUMIN 3.6    Recent Labs Lab 03/20/13 0100  LIPASE 32   No results found for this basename: AMMONIA,  in the last 168 hours CBC:  Recent Labs Lab 03/20/13 0100 03/21/13 0424 03/22/13 0501  WBC 8.8 7.4 6.6  NEUTROABS 6.7  --   --   HGB 13.1 12.7 12.2  HCT 38.6 37.6 36.4  MCV 92.3 94.2 93.8  PLT 194 150 161   Cardiac Enzymes: No results found for this basename: CKTOTAL, CKMB, CKMBINDEX, TROPONINI,  in the last 168 hours BNP: BNP (last 3 results) No results found for this basename: PROBNP,  in the last 8760 hours CBG: No results found for this basename: GLUCAP,  in the last 168 hours     Signed:  Janey Petron,CHRISTOPHER  [facility] 03/22/2013, 11:17 AM

## 2013-03-21 NOTE — Progress Notes (Signed)
[facility] PROGRESS NOTE  Elaine Owens ZOX:[id number] DOB: [birth year] DOA: 03/19/2013 PCP: No primary provider on file.  Assessment/Plan: Principal Problem:   Generalized weakness Active Problems:   Acute renal failure   Myasthenia gravis   UTI (urinary tract infection)    1. Generalized weakness: Patient presented with progressive generalized weakness, and was found to have dehydration, ARF and a UTI, in the setting of known myasthenia gravis, all, likely contributory to a multifactorial etiology. For details of specific management, of contributory conditions, see below. PT/OT evaluation has identified no acute needs.  2. Acute Gastroenteritis: Over the prior 2 days, patient has had nausea, vomiting, abdominal pain, without antecedent ingestion of unusual foods, clustering of cases, sick contacts, recent antibiotic therapy or exotic travel. Managing with iv fluids, antiemetics, and she has had no vomiting since admission. Although there were some loose stools in AM 03/20/13, diarrhea has now resolved, and patient is asymptomatic today. C. Difficile PCR was negative. Lipase was normal at 32. Suspect self-limited viral gastroenteritis.   3. Acute renal failure: Creatinine was elevated at 3.56 , BUN 35 at presentation, which given recent poor oral intake and GI losses, is likely pre-renal, due to dehydration and volume depletion. Renal U/S was negative. Baseline creatinine is unknown. Managing with iv fluids, and today, creatinine is 3.06. If no improvement by 03/22/13, will conclude that this is probably baseline. Patient will need close follow up of renal indices by PMD at discharge, as well as nephrology referral.  4. UTI (urinary tract infection): U/A revealed a positive sediment, with pyuria and bacteriuria, consistent with UTI. Patient is afebrile, and wcc is normal. Managing with iv Rocephin, now day # 3. Urine culture is negative so far.  5. Myasthenia gravis: Appears stable on  Pyridostigmine   Code Status: Full Code.  Family Communication:  Disposition Plan: To be determined. Aiming discharge on 03/22/13. Ambulate.    Brief narrative: 79 yo female with PMH of Myasthenia Gravis, OA, HTN, breast cancer, s/p mastectomy, s/p lens implant, s/p appendectomy, who presents to [facility] ED with progressively worsening generalized weakness that initially started several weeks prior to admission, associated with poor oral intake. Over the past two days she has had nausea, vomiting, several episodes of non-bloody diarrhea and generalized abdominal discomfort. Pt denies any specific alleviating or aggravating factors, and has had no similar events in the past. She explains her abdominal discomfort feels like craps, it, is intermittent and 5/10 in severity when present, non radiating, associated with subjective fevers and chill. She denies chest pain or shortness of breath, no specific focal neurological symptoms, no other systemic symptoms.  In ED, pt appeared stable but frail, U/A suggestive of UTI and electrolyte panel revealed acute renal failure with Cr > 3 (baseline creatinine is unknown). Admitted for further evaluation and management.    Consultants:  N/A.   Procedures:  N/A.   Antibiotics:  Rocephin 03/19/13>>>  HPI/Subjective: Asymptomatic. Wants to shower.   Objective: Vital signs in last 24 hours: Temp:  [98.1 F (36.7 C)-98.6 F (37 C)] 98.1 F (36.7 C) (07/05 0415) Pulse Rate:  [46-60] 52 (07/05 0415) Resp:  [16-18] 16 (07/05 0415) BP: (105-131)/(47-66) 131/49 mmHg (07/05 0415) SpO2:  [95 %-97 %] 97 % (07/05 0415) Weight change:  Last BM Date: 03/19/13  Intake/Output from previous day: 07/04 0701 - 07/05 0700 In: 1888.8 [P.O.:1320; I.V.:568.8] Out: 1251 [Urine:1250; Stool:1]     Physical Exam: General: Comfortable, alert, communicative, fully oriented, not short of breath at rest.  HEENT:  No clinical pallor, no jaundice, no conjunctival injection or  discharge. Hydration is fair.  NECK:  Supple, JVP not seen, no carotid bruits, no palpable lymphadenopathy, no palpable goiter. CHEST:  Clinically clear to auscultation, no wheezes, no crackles. HEART:  Sounds 1 and 2 heard, normal, regular, no murmurs. ABDOMEN:  Full, soft, non-tender, no palpable organomegaly, no palpable masses, normal bowel sounds. GENITALIA:  Not examined. LOWER EXTREMITIES:  No pitting edema, palpable peripheral pulses. MUSCULOSKELETAL SYSTEM:  Generalized osteoarthritic changes, otherwise, normal. CENTRAL NERVOUS SYSTEM:  No focal neurologic deficit on gross examination.  Lab Results:  Recent Labs  03/20/13 0100 03/21/13 0424  WBC 8.8 7.4  HGB 13.1 12.7  HCT 38.6 37.6  PLT 194 150    Recent Labs  03/20/13 1115 03/21/13 0424  NA 138 140  K 3.8 4.4  CL 107 111  CO2 21 19  GLUCOSE 112* 92  BUN 33* 34*  CREATININE 3.05* 3.06*  CALCIUM 8.5 8.8   Recent Results (from the past 240 hour(s))  CLOSTRIDIUM DIFFICILE BY PCR     Status: None   Collection Time    03/20/13  1:15 PM      Result Value Range Status   C difficile by pcr NEGATIVE  NEGATIVE Final     Studies/Results: US Renal  03/20/2013   *RADIOLOGY REPORT*  Clinical Data: Acute renal failure  RENAL/URINARY TRACT ULTRASOUND COMPLETE  Comparison:  None.  Findings:  Right Kidney:  Measures 11.9 cm.  No mass or hydronephrosis.  Left Kidney:  Measures 12.3 cm.  No mass or hydronephrosis.  Bladder:  Within normal limits.  IMPRESSION: Negative renal ultrasound.   Original Report Authenticated By: Charline Bills, M.D.    Medications: Scheduled Meds: . amLODipine  5 mg Oral Daily  . antiseptic oral rinse  15 mL Mouth Rinse BID  . aspirin EC  81 mg Oral Daily  . atorvastatin  20 mg Oral q1800  . calcium carbonate  1 tablet Oral QPM  . cefTRIAXone (ROCEPHIN)  IV  1 g Intravenous Q24H  . cholecalciferol  1,000 Units Oral Daily  . enoxaparin (LOVENOX) injection  30 mg Subcutaneous Q24H  .  latanoprost  1 drop Both Eyes QHS  . oxybutynin  5 mg Oral BID  . pantoprazole  40 mg Oral Daily  . pyridostigmine  90 mg Oral BID  . senna-docusate  1 tablet Oral QODAY  . sodium chloride  3 mL Intravenous Q12H   Continuous Infusions: . sodium chloride 75 mL/hr at 03/20/13 1148   PRN Meds:.diclofenac sodium, ondansetron (ZOFRAN) IV, ondansetron    LOS: 2 days   Hyder Deman,CHRISTOPHER  [facility] Pager [phone]. If 8PM-8AM, please contact night-coverage at [email or web address], password [facility] 03/21/2013, 9:59 AM  LOS: 2 days

## 2013-03-22 LAB — CREATININE, URINE, RANDOM: Creatinine, Urine: 41.1 mg/dL

## 2013-03-22 LAB — BASIC METABOLIC PANEL
BUN: 28 mg/dL — ABNORMAL HIGH (ref 6–23)
CO2: 22 mEq/L (ref 19–32)
Chloride: 113 mEq/L — ABNORMAL HIGH (ref 96–112)
Glucose, Bld: 105 mg/dL — ABNORMAL HIGH (ref 70–99)
Potassium: 3.9 mEq/L (ref 3.5–5.1)
Sodium: 144 mEq/L (ref 135–145)

## 2013-03-22 LAB — CBC
HCT: 36.4 % (ref 36.0–46.0)
Hemoglobin: 12.2 g/dL (ref 12.0–15.0)
MCHC: 33.5 g/dL (ref 30.0–36.0)
RBC: 3.88 MIL/uL (ref 3.87–5.11)
WBC: 6.6 10*3/uL (ref 4.0–10.5)

## 2013-03-22 LAB — SODIUM, URINE, RANDOM: Sodium, Ur: 67 mEq/L

## 2013-03-22 MED ORDER — CEPHALEXIN 500 MG PO CAPS: 500.0000 mg | ORAL_CAPSULE | Freq: Two times a day (BID) | ORAL | Status: DC

## 2013-03-22 NOTE — Progress Notes (Signed)
Discharge instructions explained to pt and prescription given to her. Daughter in to take pt. Home.

## 2013-03-23 LAB — URINE CULTURE: Colony Count: >=100000

## 2013-07-15 ENCOUNTER — Observation Stay (HOSPITAL_COMMUNITY)
Admission: EM | Admit: 2013-07-15 | Discharge: 2013-07-16 | Disposition: A | Discharge status: Home / Self Care | Payer: Medicare Other | Attending: Internal Medicine | Admitting: Internal Medicine

## 2013-07-15 ENCOUNTER — Emergency Department (HOSPITAL_COMMUNITY): Payer: Medicare Other

## 2013-07-15 ENCOUNTER — Observation Stay (HOSPITAL_COMMUNITY): Payer: Medicare Other

## 2013-07-15 ENCOUNTER — Encounter (HOSPITAL_COMMUNITY): Payer: Self-pay | Admitting: Emergency Medicine

## 2013-07-15 DIAGNOSIS — Z853 Personal history of malignant neoplasm of breast: Secondary | ICD-10-CM | POA: Insufficient documentation

## 2013-07-15 DIAGNOSIS — M546 Pain in thoracic spine: Secondary | ICD-10-CM | POA: Insufficient documentation

## 2013-07-15 DIAGNOSIS — I2510 Atherosclerotic heart disease of native coronary artery without angina pectoris: Secondary | ICD-10-CM

## 2013-07-15 DIAGNOSIS — M542 Cervicalgia: Secondary | ICD-10-CM | POA: Insufficient documentation

## 2013-07-15 DIAGNOSIS — Z79899 Other long term (current) drug therapy: Secondary | ICD-10-CM | POA: Insufficient documentation

## 2013-07-15 DIAGNOSIS — R42 Dizziness and giddiness: Secondary | ICD-10-CM | POA: Insufficient documentation

## 2013-07-15 DIAGNOSIS — N179 Acute kidney failure, unspecified: Secondary | ICD-10-CM

## 2013-07-15 DIAGNOSIS — R531 Weakness: Secondary | ICD-10-CM

## 2013-07-15 DIAGNOSIS — R079 Chest pain, unspecified: Secondary | ICD-10-CM

## 2013-07-15 DIAGNOSIS — N390 Urinary tract infection, site not specified: Secondary | ICD-10-CM

## 2013-07-15 DIAGNOSIS — I10 Essential (primary) hypertension: Secondary | ICD-10-CM

## 2013-07-15 DIAGNOSIS — R0789 Other chest pain: Principal | ICD-10-CM | POA: Insufficient documentation

## 2013-07-15 DIAGNOSIS — E785 Hyperlipidemia, unspecified: Secondary | ICD-10-CM | POA: Insufficient documentation

## 2013-07-15 DIAGNOSIS — G7000 Myasthenia gravis without (acute) exacerbation: Secondary | ICD-10-CM

## 2013-07-15 DIAGNOSIS — Z9010 Acquired absence of unspecified breast and nipple: Secondary | ICD-10-CM | POA: Insufficient documentation

## 2013-07-15 HISTORY — DX: Adverse effect of unspecified anesthetic, initial encounter: T41.45XA

## 2013-07-15 HISTORY — DX: Other complications of anesthesia, initial encounter: T88.59XA

## 2013-07-15 LAB — COMPREHENSIVE METABOLIC PANEL
AST: 25 U/L (ref 0–37)
Albumin: 3.9 g/dL (ref 3.5–5.2)
Alkaline Phosphatase: 33 U/L — ABNORMAL LOW (ref 39–117)
BUN: 15 mg/dL (ref 6–23)
CO2: 25 mEq/L (ref 19–32)
Calcium: 9.5 mg/dL (ref 8.4–10.5)
Chloride: 104 mEq/L (ref 96–112)
GFR calc Af Amer: 87 mL/min — ABNORMAL LOW (ref >90)
GFR calc non Af Amer: 75 mL/min — ABNORMAL LOW (ref >90)
Glucose, Bld: 102 mg/dL — ABNORMAL HIGH (ref 70–99)
Potassium: 3.8 mEq/L (ref 3.5–5.1)
Total Bilirubin: 0.3 mg/dL (ref 0.3–1.2)

## 2013-07-15 LAB — PROTIME-INR
INR: 1.01 (ref 0.00–1.49)
Prothrombin Time: 13.1 seconds (ref 11.6–15.2)

## 2013-07-15 LAB — CBC
HCT: 39.3 % (ref 36.0–46.0)
Hemoglobin: 13.5 g/dL (ref 12.0–15.0)
MCH: 32.2 pg (ref 26.0–34.0)
MCV: 93.8 fL (ref 78.0–100.0)
Platelets: 194 10*3/uL (ref 150–400)
RBC: 4.19 MIL/uL (ref 3.87–5.11)
WBC: 6.4 10*3/uL (ref 4.0–10.5)

## 2013-07-15 LAB — POCT I-STAT TROPONIN I

## 2013-07-15 LAB — APTT: aPTT: 21 seconds — ABNORMAL LOW (ref 24–37)

## 2013-07-15 LAB — TROPONIN I
Troponin I: <0.3 ng/mL (ref <0.30)
Troponin I: <0.3 ng/mL (ref <0.30)

## 2013-07-15 MED ORDER — ROSUVASTATIN CALCIUM 10 MG PO TABS
10.0000 mg | ORAL_TABLET | Freq: Every day | ORAL | Status: DC
  Administered 2013-07-15: 10 mg via ORAL
  Filled 2013-07-15 (×2): qty 1

## 2013-07-15 MED ORDER — AMLODIPINE BESYLATE 5 MG PO TABS
5.0000 mg | ORAL_TABLET | Freq: Every day | ORAL | Status: DC
  Administered 2013-07-15 – 2013-07-16 (×2): 5 mg via ORAL
  Filled 2013-07-15 (×2): qty 1

## 2013-07-15 MED ORDER — SENNOSIDES-DOCUSATE SODIUM 8.6-50 MG PO TABS
1.0000 | ORAL_TABLET | ORAL | Status: DC
  Administered 2013-07-16: 1 via ORAL
  Filled 2013-07-15: qty 1

## 2013-07-15 MED ORDER — VITAMIN D3 25 MCG (1000 UNIT) PO TABS
1000.0000 [IU] | ORAL_TABLET | Freq: Every day | ORAL | Status: DC
  Administered 2013-07-15 – 2013-07-16 (×2): 1000 [IU] via ORAL
  Filled 2013-07-15 (×2): qty 1

## 2013-07-15 MED ORDER — ASPIRIN 81 MG PO CHEW
324.0000 mg | CHEWABLE_TABLET | Freq: Once | ORAL | Status: AC
  Administered 2013-07-15: 324 mg via ORAL
  Filled 2013-07-15: qty 4

## 2013-07-15 MED ORDER — CALCIUM CARBONATE ANTACID 500 MG PO CHEW
1.0000 | CHEWABLE_TABLET | Freq: Every evening | ORAL | Status: DC
  Administered 2013-07-15: 200 mg via ORAL
  Filled 2013-07-15 (×2): qty 1

## 2013-07-15 MED ORDER — ASPIRIN EC 81 MG PO TBEC
81.0000 mg | DELAYED_RELEASE_TABLET | Freq: Every day | ORAL | Status: DC
  Administered 2013-07-15 – 2013-07-16 (×2): 81 mg via ORAL
  Filled 2013-07-15 (×2): qty 1

## 2013-07-15 MED ORDER — PANTOPRAZOLE SODIUM 40 MG PO TBEC
40.0000 mg | DELAYED_RELEASE_TABLET | Freq: Every day | ORAL | Status: DC
  Administered 2013-07-15 – 2013-07-16 (×2): 40 mg via ORAL
  Filled 2013-07-15 (×2): qty 1

## 2013-07-15 MED ORDER — OXYCODONE HCL 5 MG PO TABS
5.0000 mg | ORAL_TABLET | ORAL | Status: DC | PRN
  Administered 2013-07-15 – 2013-07-16 (×2): 5 mg via ORAL
  Filled 2013-07-15 (×2): qty 1

## 2013-07-15 MED ORDER — SODIUM CHLORIDE 0.9 % IJ SOLN
3.0000 mL | Freq: Two times a day (BID) | INTRAMUSCULAR | Status: DC
  Administered 2013-07-15: 3 mL via INTRAVENOUS

## 2013-07-15 MED ORDER — GADOBENATE DIMEGLUMINE 529 MG/ML IV SOLN
12.0000 mL | Freq: Once | INTRAVENOUS | Status: AC | PRN
  Administered 2013-07-15: 12 mL via INTRAVENOUS

## 2013-07-15 MED ORDER — CYANOCOBALAMIN 1000 MCG/ML IJ SOLN: 1000.0000 ug | INTRAMUSCULAR | Status: DC

## 2013-07-15 MED ORDER — OMEGA-3-ACID ETHYL ESTERS 1 G PO CAPS
1.0000 g | ORAL_CAPSULE | Freq: Every day | ORAL | Status: DC
  Administered 2013-07-15 – 2013-07-16 (×2): 1 g via ORAL
  Filled 2013-07-15 (×2): qty 1

## 2013-07-15 MED ORDER — SODIUM CHLORIDE 0.9 % IV SOLN
1000.0000 mL | INTRAVENOUS | Status: DC
  Administered 2013-07-15 (×2): 1000 mL via INTRAVENOUS

## 2013-07-15 MED ORDER — ENOXAPARIN SODIUM 40 MG/0.4ML ~~LOC~~ SOLN
40.0000 mg | SUBCUTANEOUS | Status: DC
  Administered 2013-07-15: 40 mg via SUBCUTANEOUS
  Filled 2013-07-15 (×2): qty 0.4

## 2013-07-15 MED ORDER — ONDANSETRON HCL 4 MG PO TABS: 4.0000 mg | ORAL_TABLET | Freq: Four times a day (QID) | ORAL | Status: DC | PRN

## 2013-07-15 MED ORDER — ONDANSETRON HCL 4 MG/2ML IJ SOLN: 4.0000 mg | Freq: Four times a day (QID) | INTRAMUSCULAR | Status: DC | PRN

## 2013-07-15 MED ORDER — LATANOPROST 0.005 % OP SOLN
1.0000 [drp] | Freq: Every day | OPHTHALMIC | Status: DC
  Administered 2013-07-15: 1 [drp] via OPHTHALMIC
  Filled 2013-07-15: qty 2.5

## 2013-07-15 MED ORDER — OXYBUTYNIN CHLORIDE 5 MG PO TABS
5.0000 mg | ORAL_TABLET | Freq: Two times a day (BID) | ORAL | Status: DC
  Administered 2013-07-15 – 2013-07-16 (×3): 5 mg via ORAL
  Filled 2013-07-15 (×4): qty 1

## 2013-07-15 MED ORDER — PYRIDOSTIGMINE BROMIDE 60 MG PO TABS
90.0000 mg | ORAL_TABLET | Freq: Two times a day (BID) | ORAL | Status: DC
  Administered 2013-07-15 – 2013-07-16 (×3): 90 mg via ORAL
  Filled 2013-07-15 (×4): qty 1.5

## 2013-07-15 MED ORDER — SIMVASTATIN 40 MG PO TABS: 40.0000 mg | ORAL_TABLET | Freq: Every evening | ORAL | Status: DC

## 2013-07-15 MED ORDER — ACETAMINOPHEN 325 MG PO TABS
650.0000 mg | ORAL_TABLET | Freq: Four times a day (QID) | ORAL | Status: DC | PRN
  Administered 2013-07-15 – 2013-07-16 (×2): 650 mg via ORAL
  Filled 2013-07-15 (×2): qty 2

## 2013-07-15 MED ORDER — NITROGLYCERIN 0.4 MG SL SUBL: 0.4000 mg | SUBLINGUAL_TABLET | SUBLINGUAL | Status: DC | PRN

## 2013-07-15 MED ORDER — ALENDRONATE SODIUM 70 MG PO TABS
70.0000 mg | ORAL_TABLET | ORAL | Status: DC
Start: 2013-07-15 — End: 2013-07-15

## 2013-07-15 MED ORDER — ACETAMINOPHEN 650 MG RE SUPP: 650.0000 mg | Freq: Four times a day (QID) | RECTAL | Status: DC | PRN

## 2013-07-15 MED ORDER — IOHEXOL 350 MG/ML SOLN
100.0000 mL | Freq: Once | INTRAVENOUS | Status: AC | PRN
  Administered 2013-07-15: 100 mL via INTRAVENOUS

## 2013-07-15 NOTE — Progress Notes (Signed)
   CARE MANAGEMENT ED NOTE 07/15/2013  Patient:  Elaine Owens, Elaine Owens   Account Number:  [id number]  Date Initiated:  07/15/2013  Documentation initiated by:  Edd Arbour  Subjective/Objective Assessment:   79 yr old female [employer] medicare complete pt with chest and back pain being admitted as observation to [facility] on telemetry VSS CBC, CMP, PT results without abnormalities troponin x 1 negative PTT 21 given asa po and ns at 10-20 cc/hr     Subjective/Objective Assessment Detail:   PCP: Court Joy, NP EKG NSR     Action/Plan:   UR completed -secondary review   Action/Plan Detail:   Anticipated DC Date:  07/16/2013     Status Recommendation to Physician:   Result of Recommendation:    Other ED Services  Consult Working Plan    DC Planning Services  Other  Outpatient Services - Pt will follow up    Choice offered to / List presented to:            Status of service:  Completed, signed off  ED Comments:   ED Comments Detail:

## 2013-07-15 NOTE — Progress Notes (Signed)

## 2013-07-15 NOTE — H&P (Signed)
[facility] History and Physical  Elaine Owens WUJ:[id number] DOB: 09/01/1926 DOA: 07/15/2013   PCP: Court Joy, NP   Chief Complaint: Chest pain and back pain  HPI:  79 year old female with a history of hypertension, myasthenia gravis, left-sided breast cancer status post mastectomy presents with 3 weeks of thoracic back pain and one day of chest discomfort. The patient states that the chest discomfort woke her up from sleep this morning. She denied any nausea, palpitations, shortness of breath. She has chronic dizziness which has been unchanged. She denies any headache, fevers, chills, coughing, hemoptysis, vomiting, diarrhea, dysuria, hematuria, abdominal pain. She states that she has had pain in her upper back and toward her left shoulder blade area the past 3 weeks. She also states that she has been doing more manual labor in the past few weeks helping her grandson move. She denies any recent trauma or falls. She denies any upper extremity weakness, neck pain, dysesthesias. The patient has been taking Aleve, 6 tablets in the past 7 days without much relief. She has used something similar to a heating pad without much relief.  In the emergency department, the patient's vital signs were stable. BMP and liver enzymes were unremarkable. CBC was unremarkable. Eyes.troponin was negative. EKG shows sinus rhythm with unchanged LAFB. CT angiogram chest was negative for pulmonary embolus but showed coronary artery calcifications. It also showed a nonspecific T10 and right posterior lucency. Assessment/Plan: Chest discomfort -CT angiogram chest negative for pulmonary embolus, but showed coronary artery calcifications -Appears to be atypical in nature -Cycle troponins -Place on telemetry Upper back pain -Suspect musculoskeletal in nature -MRI thoracic spine -Oxycodone when necessary pain Hypertension -Continue amlodipine Myasthenia gravis -Continue Mestinon History of  breast cancer -Patient finished 5 years tamoxifen Hyperlipidemia -Continue Zocor and Lovaza        Past Medical History  Diagnosis Date  . Arthritis   . Hypertension   . Immune deficiency disorder   . Myasthenia gravis   . lt breast ca dx'd 1978 or 1988 pt unsure    lt masectomy; tamoxifen completed   Past Surgical History  Procedure Laterality Date  . Masectomny Left     1978  . Breast surgery    . Appendectomy    . Eye surgery      lens implant  . Mastectomy    . Abdominal hysterectomy    . Hemorrhoid surgery     Social History:  reports that she has never smoked. She does not have any smokeless tobacco history on file. She reports that she does not drink alcohol. Her drug history is not on file.   Family History  Problem Relation Age of Onset  . Cancer Father   . Cancer Brother      Allergies  Allergen Reactions  . Ciprofloxacin Other (See Comments)    Can not take if patient has myastenia gravis  . Doxycycline Hyclate Other (See Comments)    Patient preference  . Morphine And Related Nausea And Vomiting    Mad her extremely sick      Prior to Admission medications   Medication Sig Start Date End Date Taking? Authorizing Provider  alendronate (FOSAMAX) 70 MG tablet Take 70 mg by mouth every 7 (seven) days. Sunday. Take with a full glass of water on an empty stomach.   Yes Historical Provider, MD  amLODipine (NORVASC) 5 MG tablet Take 5 mg by mouth daily.   Yes Historical Provider, MD  aspirin EC 81 MG tablet Take  81 mg by mouth daily.   Yes Historical Provider, MD  calcium carbonate (TUMS - DOSED IN MG ELEMENTAL CALCIUM) 500 MG chewable tablet Chew 1 tablet by mouth every evening.   Yes Historical Provider, MD  cholecalciferol (VITAMIN D) 1000 UNITS tablet Take 1,000 Units by mouth daily.   Yes Historical Provider, MD  cyanocobalamin (,VITAMIN B-12,) 1000 MCG/ML injection Inject 1,000 mcg into the muscle every 30 (thirty) days.   Yes Historical  Provider, MD  diclofenac sodium (VOLTAREN) 1 % GEL Apply 2 g topically daily as needed. For pain   Yes Historical Provider, MD  latanoprost (XALATAN) 0.005 % ophthalmic solution Place 1 drop into both eyes at bedtime.   Yes Historical Provider, MD  omega-3 acid ethyl esters (LOVAZA) 1 G capsule Take 1 g by mouth daily.   Yes Historical Provider, MD  omeprazole (PRILOSEC) 20 MG capsule Take 20 mg by mouth daily.   Yes Historical Provider, MD  oxybutynin (DITROPAN) 5 MG tablet Take 5 mg by mouth 2 (two) times daily.   Yes Historical Provider, MD  pyridostigmine (MESTINON) 60 MG tablet Take 90 mg by mouth 2 (two) times daily.   Yes Historical Provider, MD  senna-docusate (SENNA PLUS) 8.6-50 MG per tablet Take 1 tablet by mouth every other day.   Yes Historical Provider, MD  SIMBRINZA 1-0.2 % SUSP  06/30/13  Yes Historical Provider, MD  simvastatin (ZOCOR) 40 MG tablet Take 40 mg by mouth every evening.   Yes Historical Provider, MD    Review of Systems:  Constitutional:  No weight loss, night sweats, Fevers, chills, fatigue.  Head&Eyes: No headache.  No vision loss. ENT:  No Difficulty swallowing,Tooth/dental problems,Sore throat,    Cardio-vascular:  No Orthopnea, PND, swelling in lower extremities,  dizziness, palpitations  GI:  No  abdominal pain, nausea, vomiting, diarrhea, loss of appetite, hematochezia, melena, heartburn, indigestion, Resp:  No shortness of breath with exertion or at rest. No cough. No coughing up of blood .No wheezing.No chest wall deformity  Skin:  no rash or lesions.  GU:  no dysuria, change in color of urine, no urgency or frequency. No flank pain.  Musculoskeletal:  No joint pain or swelling. No decreased range of motion. Complains of upper thoracic pain for the past 3 weeks Psych:  No change in mood or affect. No depression or anxiety. Neurologic: No headache, no dysesthesia, no focal weakness, no vision loss. No syncope  Physical Exam: Filed Vitals:    07/15/13 0653 07/15/13 0655 07/15/13 0910  BP: 143/50 143/50 143/44  Pulse: 65 60 61  Temp:  98.2 F (36.8 C)   TempSrc:  Oral   Resp: 14 18 13   Height:  5\' 2"  (1.575 m)   Weight:  62.143 kg (137 lb)   SpO2: 96% 95% 99%   General:  A&O x 3, NAD, nontoxic, pleasant/cooperative Head/Eye: No conjunctival hemorrhage, no icterus, [place]/AT, No nystagmus ENT:  No icterus,  No thrush, good dentition, no pharyngeal exudate Neck:  No masses, no lymphadenpathy, no bruits CV:  RRR, no rub, no gallop, no S3 Lung:  CTAB, good air movement, no wheeze, no rhonchi Abdomen: soft/NT, +BS, nondistended, no peritoneal signs Ext: No cyanosis, No rashes, No petechiae, No lymphangitis, trace LE edema Neuro: CNII-XII intact, strength 4/5 in bilateral upper and lower extremities, no dysmetria  Labs on Admission:  Basic Metabolic Panel:  Recent Labs Lab 07/15/13 0750  NA 139  K 3.8  CL 104  CO2 25  GLUCOSE 102*  BUN 15  CREATININE 0.74  CALCIUM 9.5   Liver Function Tests:  Recent Labs Lab 07/15/13 0750  AST 25  ALT 29  ALKPHOS 33*  BILITOT 0.3  PROT 6.7  ALBUMIN 3.9   No results found for this basename: LIPASE, AMYLASE,  in the last 168 hours No results found for this basename: AMMONIA,  in the last 168 hours CBC:  Recent Labs Lab 07/15/13 0750  WBC 6.4  HGB 13.5  HCT 39.3  MCV 93.8  PLT 194   Cardiac Enzymes: No results found for this basename: CKTOTAL, CKMB, CKMBINDEX, TROPONINI,  in the last 168 hours BNP: No components found with this basename: POCBNP,  CBG: No results found for this basename: GLUCAP,  in the last 168 hours  Radiological Exams on Admission: Ct Angio Chest W/cm &/or Wo Cm  07/15/2013   CLINICAL DATA:  Scapula pain.  Breast cancer.  EXAM: CT ANGIOGRAPHY CHEST WITH CONTRAST  TECHNIQUE: Multidetector CT imaging of the chest was performed using the standard protocol during bolus administration of intravenous contrast. Multiplanar CT image reconstructions  including MIPs were obtained to evaluate the vascular anatomy.  CONTRAST:  100 cc Omnipaque 300.  COMPARISON:  07/15/2013 chest x-ray. No comparison chest CT.  FINDINGS: No evidence of pulmonary embolus.  Heart size top-normal.  Coronary artery calcifications.  Atherosclerotic type changes of the thoracic aorta with ascending thoracic aorta mildly ectatic measuring up to 3.4 cm. No evidence of aortic dissection.  Post left mastectomy without evidence of local recurrence.  Axillary lymph nodes with fatty centers without worrisome the axillary, mediastinal or hilar adenopathy.  5 mm lucency right posterior aspect T10 vertebral body (series 4 image 69) of questionable etiology. Tiny metastatic lesion not excluded.  5 cm bullae/bleb anterior aspect left upper lobe.  Apical parenchymal changes greater on the left may represent scarring. Stability can be confirmed on follow-up.  Visualized upper abdominal structures unremarkable with the exception of a small hiatal hernia.  Review of the MIP images confirms the above findings.  IMPRESSION: No evidence of pulmonary embolus.  Heart size top-normal. Coronary artery calcifications.  Atherosclerotic type changes of the thoracic aorta with ascending thoracic aorta mildly ectatic measuring up to 3.4 cm. No evidence of aortic dissection.  Post left mastectomy without evidence of local recurrence.  Axillary lymph nodes with fatty centers without worrisome the axillary, mediastinal or hilar adenopathy.  5 mm lucency right posterior aspect T10 vertebral body (series 4 image 69) of questionable etiology. Tiny metastatic lesion not excluded.  5 cm bullae/bleb anterior aspect left upper lobe.  Apical parenchymal changes greater on the left may represent scarring. Stability can be confirmed on follow-up.  Small hiatal hernia.   Electronically Signed   By: Bridgett Larsson M.D.   On: 07/15/2013 09:59   Dg Chest Portable 1 View  07/15/2013   CLINICAL DATA:  Chest pain.  EXAM: PORTABLE  CHEST - 1 VIEW  COMPARISON:  None.  FINDINGS: The cardiac silhouette, mediastinal and hilar contours are within normal limits. There are chronic appearing bronchitic type interstitial lung changes. No definite infiltrates, edema or effusions. Mild eventration of the right hemidiaphragm is noted. The bony thorax is intact.  IMPRESSION: Chronic appearing bronchitic type interstitial lung changes but no definite acute cardiopulmonary findings.   Electronically Signed   By: Loralie Champagne M.D.   On: 07/15/2013 07:45    EKG: Independently reviewed. Sinus rhythm, no ST-T wave change   Time spent:60 minutes Code Status:   FULL  Family Communication:   Daughter at bedside   Malayjah Otoole, DO  [facility] Pager [phone]  If 7PM-7AM, please contact night-coverage [email or web address] Password [email or web address] 07/15/2013, 11:01 AM

## 2013-07-15 NOTE — ED Notes (Signed)
Dr.Knapp at bedside  

## 2013-07-15 NOTE — ED Notes (Addendum)
Patient with c/o pain "between my shoulder blades that has gotten worse and now moved to the left side of my chest and the front." Patient states she has been having pain since 10/9, but has not been evaluated by a healthcare provider Patient denies SOB, N/V during this time or this morning LCTA

## 2013-07-15 NOTE — ED Provider Notes (Signed)
CSN: [id number]     Arrival date & time 07/15/13  4098 History   First MD Initiated Contact with Patient 07/15/13 [id number]     Chief Complaint  Patient presents with  . Chest Pain    Patient is a 79 y.o. female presenting with chest pain. The history is provided by the patient.  Chest Pain Pain location:  L chest Pain quality: aching   Pain radiates to:  Does not radiate Pain severity:  Severe Onset quality: woke her up this am. Timing:  Constant Progression:  Improving Context: not breathing, not lifting and no movement   Relieved by:  Nothing Worsened by:  Nothing tried Ineffective treatments:  None tried Associated symptoms: back pain   Associated symptoms: no cough, no fever, no heartburn, no nausea and no shortness of breath   Risk factors: no coronary artery disease and no prior DVT/PE    It started a couple of weeks ago with pain in her back between her shoulder blades.  For the last several days it has been constant.  Nothing seems to make it worse.  This morning however she woke up with chest pain. Past Medical History  Diagnosis Date  . Arthritis   . Hypertension   . Immune deficiency disorder   . Myasthenia gravis   . lt breast ca dx'd 1978 or 1988 pt unsure    lt masectomy; tamoxifen completed   Past Surgical History  Procedure Laterality Date  . Masectomny Left     1978  . Breast surgery    . Appendectomy    . Eye surgery      lens implant  . Mastectomy    . Abdominal hysterectomy    . Hemorrhoid surgery     Family History  Problem Relation Age of Onset  . Cancer Father   . Cancer Brother    History  Substance Use Topics  . Smoking status: Never Smoker   . Smokeless tobacco: Not on file  . Alcohol Use: No   OB History   Grav Para Term Preterm Abortions TAB SAB Ect Mult Living                 Review of Systems  Constitutional: Negative for fever.  Respiratory: Negative for cough and shortness of breath.   Cardiovascular: Positive for chest  pain.  Gastrointestinal: Negative for heartburn and nausea.  Musculoskeletal: Positive for back pain.  All other systems reviewed and are negative.    Allergies  Ciprofloxacin; Doxycycline hyclate; and Morphine and related  Home Medications   Current Outpatient Rx  Name  Route  Sig  Dispense  Refill  . alendronate (FOSAMAX) 70 MG tablet   Oral   Take 70 mg by mouth every 7 (seven) days. Sunday. Take with a full glass of water on an empty stomach.         Marland Kitchen amLODipine (NORVASC) 5 MG tablet   Oral   Take 5 mg by mouth daily.         Marland Kitchen aspirin EC 81 MG tablet   Oral   Take 81 mg by mouth daily.         . calcium carbonate (TUMS - DOSED IN MG ELEMENTAL CALCIUM) 500 MG chewable tablet   Oral   Chew 1 tablet by mouth every evening.         . cholecalciferol (VITAMIN D) 1000 UNITS tablet   Oral   Take 1,000 Units by mouth daily.         Marland Kitchen  cyanocobalamin (,VITAMIN B-12,) 1000 MCG/ML injection   Intramuscular   Inject 1,000 mcg into the muscle every 30 (thirty) days.         . diclofenac sodium (VOLTAREN) 1 % GEL   Topical   Apply 2 g topically daily as needed. For pain         . latanoprost (XALATAN) 0.005 % ophthalmic solution   Both Eyes   Place 1 drop into both eyes at bedtime.         Marland Kitchen omega-3 acid ethyl esters (LOVAZA) 1 G capsule   Oral   Take 1 g by mouth daily.         Marland Kitchen omeprazole (PRILOSEC) 20 MG capsule   Oral   Take 20 mg by mouth daily.         Marland Kitchen oxybutynin (DITROPAN) 5 MG tablet   Oral   Take 5 mg by mouth 2 (two) times daily.         Marland Kitchen pyridostigmine (MESTINON) 60 MG tablet   Oral   Take 90 mg by mouth 2 (two) times daily.         Marland Kitchen senna-docusate (SENNA PLUS) 8.6-50 MG per tablet   Oral   Take 1 tablet by mouth every other day.         Marland Kitchen SIMBRINZA 1-0.2 % SUSP               . simvastatin (ZOCOR) 40 MG tablet   Oral   Take 40 mg by mouth every evening.          BP 143/44  Pulse 61  Temp(Src) 98.2 F  (36.8 C) (Oral)  Resp 13  Ht 5\' 2"  (1.575 m)  Wt 137 lb (62.143 kg)  BMI 25.05 kg/m2  SpO2 99% Physical Exam  Nursing note and vitals reviewed. Constitutional: She appears well-developed and well-nourished. No distress.  Elderly   HENT:  Head: Normocephalic and atraumatic.  Right Ear: External ear normal.  Left Ear: External ear normal.  Eyes: Conjunctivae are normal. Right eye exhibits no discharge. Left eye exhibits no discharge. No scleral icterus.  Neck: Neck supple. No tracheal deviation present.  Cardiovascular: Normal rate, regular rhythm and intact distal pulses.   Pulmonary/Chest: Effort normal and breath sounds normal. No stridor. No respiratory distress. She has no wheezes. She has no rales. She exhibits no tenderness.  Abdominal: Soft. Bowel sounds are normal. She exhibits no distension. There is no tenderness. There is no rebound and no guarding.  Musculoskeletal: She exhibits no edema and no tenderness.  Neurological: She is alert. She has normal strength. No sensory deficit. Cranial nerve deficit:  no gross defecits noted. She exhibits normal muscle tone. She displays no seizure activity. Coordination normal.  Skin: Skin is warm and dry. No rash noted.  Psychiatric: She has a normal mood and affect.    ED Course  Procedures (including critical care time) Labs Review Labs Reviewed  COMPREHENSIVE METABOLIC PANEL - Abnormal; Notable for the following:    Glucose, Bld 102 (*)    Alkaline Phosphatase 33 (*)    GFR calc non Af Amer 75 (*)    GFR calc Af Amer 87 (*)    All other components within normal limits  APTT - Abnormal; Notable for the following:    aPTT 21 (*)    All other components within normal limits  CBC  PROTIME-INR  POCT I-STAT TROPONIN I  POCT I-STAT TROPONIN I   Imaging Review Ct Angio Chest W/cm &/  or Wo Cm  07/15/2013   CLINICAL DATA:  Scapula pain.  Breast cancer.  EXAM: CT ANGIOGRAPHY CHEST WITH CONTRAST  TECHNIQUE: Multidetector CT imaging  of the chest was performed using the standard protocol during bolus administration of intravenous contrast. Multiplanar CT image reconstructions including MIPs were obtained to evaluate the vascular anatomy.  CONTRAST:  100 cc Omnipaque 300.  COMPARISON:  07/15/2013 chest x-ray. No comparison chest CT.  FINDINGS: No evidence of pulmonary embolus.  Heart size top-normal.  Coronary artery calcifications.  Atherosclerotic type changes of the thoracic aorta with ascending thoracic aorta mildly ectatic measuring up to 3.4 cm. No evidence of aortic dissection.  Post left mastectomy without evidence of local recurrence.  Axillary lymph nodes with fatty centers without worrisome the axillary, mediastinal or hilar adenopathy.  5 mm lucency right posterior aspect T10 vertebral body (series 4 image 69) of questionable etiology. Tiny metastatic lesion not excluded.  5 cm bullae/bleb anterior aspect left upper lobe.  Apical parenchymal changes greater on the left may represent scarring. Stability can be confirmed on follow-up.  Visualized upper abdominal structures unremarkable with the exception of a small hiatal hernia.  Review of the MIP images confirms the above findings.  IMPRESSION: No evidence of pulmonary embolus.  Heart size top-normal. Coronary artery calcifications.  Atherosclerotic type changes of the thoracic aorta with ascending thoracic aorta mildly ectatic measuring up to 3.4 cm. No evidence of aortic dissection.  Post left mastectomy without evidence of local recurrence.  Axillary lymph nodes with fatty centers without worrisome the axillary, mediastinal or hilar adenopathy.  5 mm lucency right posterior aspect T10 vertebral body (series 4 image 69) of questionable etiology. Tiny metastatic lesion not excluded.  5 cm bullae/bleb anterior aspect left upper lobe.  Apical parenchymal changes greater on the left may represent scarring. Stability can be confirmed on follow-up.  Small hiatal hernia.   Electronically  Signed   By: Bridgett Larsson M.D.   On: 07/15/2013 09:59   Dg Chest Portable 1 View  07/15/2013   CLINICAL DATA:  Chest pain.  EXAM: PORTABLE CHEST - 1 VIEW  COMPARISON:  None.  FINDINGS: The cardiac silhouette, mediastinal and hilar contours are within normal limits. There are chronic appearing bronchitic type interstitial lung changes. No definite infiltrates, edema or effusions. Mild eventration of the right hemidiaphragm is noted. The bony thorax is intact.  IMPRESSION: Chronic appearing bronchitic type interstitial lung changes but no definite acute cardiopulmonary findings.   Electronically Signed   By: Loralie Champagne M.D.   On: 07/15/2013 07:45    EKG Interpretation     Ventricular Rate:  61 PR Interval:  155 QRS Duration: 90 QT Interval:  430 QTC Calculation: 433 R Axis:   -57 Text Interpretation:  Sinus rhythm Left anterior fascicular block No significant change since last tracing           Medications  0.9 %  sodium chloride infusion (1,000 mLs Intravenous New Bag/Given 07/15/13 0910)  nitroGLYCERIN (NITROSTAT) SL tablet 0.4 mg (not administered)  aspirin chewable tablet 324 mg (324 mg Oral Given 07/15/13 0737)  iohexol (OMNIPAQUE) 350 MG/ML injection 100 mL (100 mLs Intravenous Contrast Given 07/15/13 0928)     MDM   1. Chest pain   2. Coronary artery calcification seen on CAT scan    The patient's CT scan does not show evidence of aneurysm, dissection or pulmonary embolism. Patient does have coronary artery calcifications on the CT scan. Her symptoms may be related to acute  coronary syndrome although there are no acute EKG findings or abnormal cardiac enzymes at this time. I will consult the medical service regarding admission for further evaluation  I did discuss incidental CT scan findings with the patient. I recommended followup with her primary care doctor   Celene Kras, MD 07/15/13 1037

## 2013-07-16 LAB — BASIC METABOLIC PANEL
Calcium: 9.7 mg/dL (ref 8.4–10.5)
GFR calc Af Amer: 61 mL/min — ABNORMAL LOW (ref >90)
GFR calc non Af Amer: 52 mL/min — ABNORMAL LOW (ref >90)
Potassium: 3.9 mEq/L (ref 3.5–5.1)
Sodium: 139 mEq/L (ref 135–145)

## 2013-07-16 LAB — TROPONIN I: Troponin I: <0.3 ng/mL (ref <0.30)

## 2013-07-16 MED ORDER — OMEPRAZOLE 20 MG PO CPDR: 40.0000 mg | DELAYED_RELEASE_CAPSULE | Freq: Every day | ORAL | Status: DC

## 2013-07-16 MED ORDER — ACETAMINOPHEN 325 MG PO TABS: 650.0000 mg | ORAL_TABLET | Freq: Four times a day (QID) | ORAL | Status: DC | PRN

## 2013-07-16 NOTE — Evaluation (Signed)
Physical Therapy One Time Evaluation Patient Details Name: Elaine Owens MRN: [id number] DOB: [birth year] Today's Date: 07/16/2013 Time: 5784-6962 PT Time Calculation (min): 11 min  PT Assessment / Plan / Recommendation History of Present Illness  Pt is an 79 year old female with a history of hypertension, myasthenia gravis, left-sided breast cancer status post mastectomy presents with 3 weeks of thoracic back pain and one day of chest discomfort. The patient states that the chest discomfort woke her up from sleep this morning. She denied any nausea, palpitations, shortness of breath. She has chronic dizziness which has been unchanged. She denies any headache, fevers, chills, coughing, hemoptysis, vomiting, diarrhea, dysuria, hematuria, abdominal pain. She states that she has had pain in her upper back and toward her left shoulder blade area the past 3 weeks. She also states that she has been doing more manual labor in the past few weeks helping her grandson move. She denies any recent trauma or falls. She denies any upper extremity weakness, neck pain, dysesthesias. The patient has been taking Aleve, 6 tablets in the past 7 days without much relief. She has used something similar to a heating pad without much relief.  Clinical Impression  Pt evaluated by Physical Therapy with no futher acute PT needs identified. All education has been completed and the pt has no further questions. See below for any follow-up PT or equipment needs. PT is signing off. Thank you for this referral.  Pt able to perform all bed mobility and transfers at MOD I level, as well as ambulate and traverse stairs with supervision to ensure safety.      PT Assessment  Patent does not need any further PT services    Follow Up Recommendations  No PT follow up    Does the patient have the potential to tolerate intense rehabilitation      Barriers to Discharge        Equipment Recommendations  None recommended by PT     Recommendations for Other Services     Frequency      Precautions / Restrictions Precautions Precautions: Fall Restrictions Weight Bearing Restrictions: No   Pertinent Vitals/Pain No c/o pain/SOB/dizziness during session.      Mobility  Bed Mobility Bed Mobility: Supine to Sit;Sitting - Scoot to Edge of Bed Supine to Sit: 6: Modified independent (Device/Increase time) Sitting - Scoot to Edge of Bed: 6: Modified independent (Device/Increase time) Details for Bed Mobility Assistance: MOD I due to increased time. pt demostrates safety during bed mobilty. Transfers Transfers: Sit to Stand;Stand to Sit Sit to Stand: 6: Modified independent (Device/Increase time);From bed;With upper extremity assist Stand to Sit: 6: Modified independent (Device/Increase time);To chair/3-in-1;With upper extremity assist;With armrests Details for Transfer Assistance: MOD I during transfers due to use of UEs and increased time. pt demonstrates safety during transfers. Ambulation/Gait Ambulation/Gait Assistance: 5: Supervision Ambulation Distance (Feet): 200 Feet Assistive device: None Ambulation/Gait Assistance Details: min guard during first 20' and pt demonstrated good balance so progressed to supervision to ensure safety. Gait Pattern: Within Functional Limits Gait velocity: decreased Stairs: Yes Stairs Assistance: 5: Supervision Stairs Assistance Details (indicate cue type and reason): pt ascended 11 steps in step-over pattern and descended steps in step-pattern with one handrail. min guard during first 5 steps to ensure safey with pt progressing to supervision as pt did not experience any LOB. Stair Management Technique: One rail Right Number of Stairs: 11    Exercises     PT Diagnosis:    PT Problem List:  PT Treatment Interventions:       PT Goals(Current goals can be found in the care plan section) Acute Rehab PT Goals PT Goal Formulation: No goals set, d/c therapy  Visit  Information  Last PT Received On: 07/16/13 Assistance Needed: +1 History of Present Illness: Pt is an 79 year old female with a history of hypertension, myasthenia gravis, left-sided breast cancer status post mastectomy presents with 3 weeks of thoracic back pain and one day of chest discomfort. The patient states that the chest discomfort woke her up from sleep this morning. She denied any nausea, palpitations, shortness of breath. She has chronic dizziness which has been unchanged. She denies any headache, fevers, chills, coughing, hemoptysis, vomiting, diarrhea, dysuria, hematuria, abdominal pain. She states that she has had pain in her upper back and toward her left shoulder blade area the past 3 weeks. She also states that she has been doing more manual labor in the past few weeks helping her grandson move. She denies any recent trauma or falls. She denies any upper extremity weakness, neck pain, dysesthesias. The patient has been taking Aleve, 6 tablets in the past 7 days without much relief. She has used something similar to a heating pad without much relief.       Prior Functioning  Home Living Family/patient expects to be discharged to:: Private residence Living Arrangements: Children Available Help at Discharge: Family;Available PRN/intermittently Type of Home: House Home Access: Level entry Home Layout: Two level;Bed/bath upstairs Alternate Level Stairs-Number of Steps: 11 Alternate Level Stairs-Rails: Right Home Equipment: Cane - single point;Shower seat Prior Function Level of Independence: Independent Comments: Her daughter works during the day and pt reports she is able to care for herself. Communication Communication: No difficulties    Cognition  Cognition Arousal/Alertness: Awake/alert Behavior During Therapy: WFL for tasks assessed/performed Overall Cognitive Status: Within Functional Limits for tasks assessed    Extremity/Trunk Assessment Lower Extremity  Assessment Lower Extremity Assessment: Overall WFL for tasks assessed   Balance    End of Session PT - End of Session Equipment Utilized During Treatment: Gait belt Activity Tolerance: Patient tolerated treatment well Patient left: in chair;with call bell/phone within reach  GP Functional Assessment Tool Used: clinical judgement Functional Limitation: Mobility: Walking and moving around Mobility: Walking and Moving Around Current Status (O1308): 0 percent impaired, limited or restricted Mobility: Walking and Moving Around Goal Status (M5784): 0 percent impaired, limited or restricted Mobility: Walking and Moving Around Discharge Status [phone]): 0 percent impaired, limited or restricted   Sol Blazing 07/16/2013, 3:12 PM

## 2013-07-16 NOTE — Progress Notes (Signed)
Pt c/o new blurry vision and objects "rolling on the walls." Neuro assessment done and unremarkable other than her pupils are 1mm in size bilaterally. Due to the small size of the pupils it is difficult to determine their reaction to light. No focal weakness noted with assessment. MD made aware of findings and orders received to continue with cyclic neuro assessments and to monitor pt's status. Will continue to monitor pt for changes.   Arta Bruce [facility] 07/16/2013

## 2013-07-16 NOTE — Progress Notes (Signed)
Pt reports that her blurry vision and the "objects rolling on the walls" has resolved. Her pupils are now 2mm and have a brisk reaction to light. Pt reports that she thinks the pain medication that she received may be too strong and may be what caused her symptoms. This was my suspision as well. Pt also reports that while she was eating breakfast she felt a lump in her chest while swallowing and that it was difficult to swallow at times. Pt reports that this has resolved since she has stopped eating. Will make MD aware of this and continue to monitor pt.  Arta Bruce [facility] 07/16/2013

## 2013-07-16 NOTE — Evaluation (Signed)
I have reviewed this note and agree with all findings. Kati Roma Bondar, PT, DPT Pager: [phone]   

## 2013-07-16 NOTE — Progress Notes (Signed)
CSW received consult for d/c planning - note PT recommended no PT follow-up, patient to return home at discharge.   CSW signing off.   Unice Bailey, LCSW [facility] Clinical Social Worker cell #: [phone]

## 2013-07-16 NOTE — Discharge Summary (Signed)
Physician Discharge Summary  Elaine Owens NWG:[id number] DOB: 05-27-1926 DOA: 07/15/2013  PCP: Court Joy, NP  Admit date: 07/15/2013 Discharge date: 07/16/2013  Time spent:<30 minutes  Recommendations for Outpatient Follow-up:  Court Joy, NP  In 1-2weeks, call for appt upon discharge   Discharge Diagnoses:  Active Problems: Chest pain Back pain, Upper   Unspecified essential hypertension Myasthenia Gravis Hyerlipidemia  Discharge Condition: improved/stable  Diet recommendation: 2g Na heart healthy  Filed Weights   07/15/13 0655 07/15/13 1215 07/16/13 0523  Weight: 62.143 kg (137 lb) 59.512 kg (131 lb 3.2 oz) 59.557 kg (131 lb 4.8 oz)    History of present illness:  79 year old female with a history of hypertension, myasthenia gravis, left-sided breast cancer status post mastectomy presents with 3 weeks of thoracic back pain and one day of chest discomfort.  She denied any nausea, palpitations, shortness of breath. She has chronic dizziness which has been unchanged. She states that she has had pain in her upper back and toward her left shoulder blade area the past 3 weeks. She admitting to lifting heavy objects as in the  past few weeks she was helping her grandson move. She denies any recent trauma or falls. She denies any upper extremity weakness, neck pain, dysesthesias. The patient has been taking Aleve, 6 tablets in the past 7 days without much relief. She has used something similar to a heating pad without much relief.  In the emergency department, the patient's vital signs were stable. BMP and liver enzymes were unremarkable. CBC was unremarkable. Eyes.troponin was negative. EKG shows sinus rhythm with unchanged LAFB. CT angiogram chest was negative for pulmonary embolus but showed coronary artery calcifications. It also showed a nonspecific T10 and right posterior lucency   Hospital Course:  Chest discomfort  -As discussed above, upon  admission CEs were cycled and came back neg. CT angiogram chest negative for pulmonary embolus, but showed coronary artery calcifications.The pain appeared to be atypical in nature per history. She was monitored on TELE and no arrhythmias reported. Her pain resolved and she remained CP free. It was noted that she had been taking NSAIDs- and her PPI dose was increased and she was instructed to avoid the oral NSAIDs. The impression was that her pain was likely d/t GERD. She is to follow up with her PCP.  Upper back pain  -Suspect musculoskeletal in nature  -MRI thoracic spine was done and showed Slight degenerative changes with mild foraminal narrowing on the right at T1-2 and T2-3, No evidence for metastatic disease to the thoracic spine. -she was treated with oxycodone prn but this was dc'ed as she stated that it was too strong for her, she was to continue tylenol prn on d/c Hypertension  -Continue amlodipine  Myasthenia gravis  -Continue Mestinon  History of breast cancer  -Patient finished 5 years tamoxifen  Hyperlipidemia  -Continue Zocor and Lovaza      Procedures:  none  Consultations:  none  Discharge Exam: Filed Vitals:   07/16/13 1336  BP: 106/50  Pulse: 59  Temp: 98.2 F (36.8 C)  Resp: 16  General: A&O x 3, NAD CV: RRR, no rub, no gallop, no S3  Lung: CTAB, good air movement, no wheeze, no rhonchi  Abdomen: soft/NT, +BS, nondistended, no peritoneal signs  Ext: No cyanosis, No rashes, No edema  Neuro: CNII-XII intact, strength 4/5 in bilateral upper and lower extremities  Discharge Instructions  Discharge Orders   Future Orders Complete By Expires   Diet -  low sodium heart healthy  As directed    Increase activity slowly  As directed        Medication List         acetaminophen 325 MG tablet  Commonly known as:  TYLENOL  Take 2 tablets (650 mg total) by mouth every 6 (six) hours as needed.     alendronate 70 MG tablet  Commonly known as:  FOSAMAX   Take 70 mg by mouth every 7 (seven) days. Sunday. Take with a full glass of water on an empty stomach.     amLODipine 5 MG tablet  Commonly known as:  NORVASC  Take 5 mg by mouth daily.     aspirin EC 81 MG tablet  Take 81 mg by mouth daily.     calcium carbonate 500 MG chewable tablet  Commonly known as:  TUMS - dosed in mg elemental calcium  Chew 1 tablet by mouth every evening.     cholecalciferol 1000 UNITS tablet  Commonly known as:  VITAMIN D  Take 1,000 Units by mouth daily.     cyanocobalamin 1000 MCG/ML injection  Commonly known as:  (VITAMIN B-12)  Inject 1,000 mcg into the muscle every 30 (thirty) days.     diclofenac sodium 1 % Gel  Commonly known as:  VOLTAREN  Apply 2 g topically daily as needed. For pain     latanoprost 0.005 % ophthalmic solution  Commonly known as:  XALATAN  Place 1 drop into both eyes at bedtime.     omega-3 acid ethyl esters 1 G capsule  Commonly known as:  LOVAZA  Take 1 g by mouth daily.     omeprazole 20 MG capsule  Commonly known as:  PRILOSEC  Take 2 capsules (40 mg total) by mouth daily.     oxybutynin 5 MG tablet  Commonly known as:  DITROPAN  Take 5 mg by mouth 2 (two) times daily.     pyridostigmine 60 MG tablet  Commonly known as:  MESTINON  Take 90 mg by mouth 2 (two) times daily.     SENNA PLUS 8.6-50 MG per tablet  Generic drug:  senna-docusate  Take 1 tablet by mouth every other day.     SIMBRINZA 1-0.2 % Susp  Generic drug:  Brinzolamide-Brimonidine     simvastatin 40 MG tablet  Commonly known as:  ZOCOR  Take 40 mg by mouth every evening.       Allergies  Allergen Reactions  . Ciprofloxacin Other (See Comments)    Can not take if patient has myastenia gravis  . Doxycycline Hyclate Other (See Comments)    Patient preference  . Morphine And Related Nausea And Vomiting    Mad her extremely sick      The results of significant diagnostics from this hospitalization (including imaging, microbiology,  ancillary and laboratory) are listed below for reference.    Significant Diagnostic Studies: Ct Angio Chest W/cm &/or Wo Cm  07/15/2013   CLINICAL DATA:  Scapula pain.  Breast cancer.  EXAM: CT ANGIOGRAPHY CHEST WITH CONTRAST  TECHNIQUE: Multidetector CT imaging of the chest was performed using the standard protocol during bolus administration of intravenous contrast. Multiplanar CT image reconstructions including MIPs were obtained to evaluate the vascular anatomy.  CONTRAST:  100 cc Omnipaque 300.  COMPARISON:  07/15/2013 chest x-ray. No comparison chest CT.  FINDINGS: No evidence of pulmonary embolus.  Heart size top-normal.  Coronary artery calcifications.  Atherosclerotic type changes of the thoracic aorta with ascending  thoracic aorta mildly ectatic measuring up to 3.4 cm. No evidence of aortic dissection.  Post left mastectomy without evidence of local recurrence.  Axillary lymph nodes with fatty centers without worrisome the axillary, mediastinal or hilar adenopathy.  5 mm lucency right posterior aspect T10 vertebral body (series 4 image 69) of questionable etiology. Tiny metastatic lesion not excluded.  5 cm bullae/bleb anterior aspect left upper lobe.  Apical parenchymal changes greater on the left may represent scarring. Stability can be confirmed on follow-up.  Visualized upper abdominal structures unremarkable with the exception of a small hiatal hernia.  Review of the MIP images confirms the above findings.  IMPRESSION: No evidence of pulmonary embolus.  Heart size top-normal. Coronary artery calcifications.  Atherosclerotic type changes of the thoracic aorta with ascending thoracic aorta mildly ectatic measuring up to 3.4 cm. No evidence of aortic dissection.  Post left mastectomy without evidence of local recurrence.  Axillary lymph nodes with fatty centers without worrisome the axillary, mediastinal or hilar adenopathy.  5 mm lucency right posterior aspect T10 vertebral body (series 4 image 69)  of questionable etiology. Tiny metastatic lesion not excluded.  5 cm bullae/bleb anterior aspect left upper lobe.  Apical parenchymal changes greater on the left may represent scarring. Stability can be confirmed on follow-up.  Small hiatal hernia.   Electronically Signed   By: Bridgett Larsson M.D.   On: 07/15/2013 09:59   Mr Thoracic Spine W Wo Contrast  07/15/2013   CLINICAL DATA:  Neck pain. History of breast cancer.  EXAM: MRI THORACIC SPINE WITHOUT AND WITH CONTRAST  TECHNIQUE: Multiplanar and multiecho pulse sequences of the thoracic spine were obtained without and with intravenous contrast.  CONTRAST:  12mL MULTIHANCE GADOBENATE DIMEGLUMINE 529 MG/ML IV SOLN  COMPARISON:  CT of the head thoracic spine 07/15/2013.  FINDINGS: Normal signal is present in the thoracic spinal cord to the conus medullaris which terminates at L1. Marrow signal, vertebral body heights, and alignment are normal. The postcontrast images demonstrate no evidence for metastatic disease of the spine.  No significant disc herniation is present. There are some endplate changes on the right at T1-2 along with facet hypertrophy which contributes to mild right foraminal narrowing at T1-2. Similar findings are noted on the right at T2-3.  The paraspinous tissues are unremarkable.  IMPRESSION: 1. No evidence for metastatic disease to the thoracic spine. 2. Slight degenerative changes with mild foraminal narrowing on the right at T1-2 and T2-3.   Electronically Signed   By: Gennette Pac M.D.   On: 07/15/2013 19:43   Dg Chest Portable 1 View  07/15/2013   CLINICAL DATA:  Chest pain.  EXAM: PORTABLE CHEST - 1 VIEW  COMPARISON:  None.  FINDINGS: The cardiac silhouette, mediastinal and hilar contours are within normal limits. There are chronic appearing bronchitic type interstitial lung changes. No definite infiltrates, edema or effusions. Mild eventration of the right hemidiaphragm is noted. The bony thorax is intact.  IMPRESSION: Chronic  appearing bronchitic type interstitial lung changes but no definite acute cardiopulmonary findings.   Electronically Signed   By: Loralie Champagne M.D.   On: 07/15/2013 07:45    Microbiology: No results found for this or any previous visit (from the past 240 hour(s)).   Labs: Basic Metabolic Panel:  Recent Labs Lab 07/15/13 0750 07/16/13 0545  NA 139 139  K 3.8 3.9  CL 104 103  CO2 25 27  GLUCOSE 102* 88  BUN 15 15  CREATININE 0.74 0.96  CALCIUM  9.5 9.7   Liver Function Tests:  Recent Labs Lab 07/15/13 0750  AST 25  ALT 29  ALKPHOS 33*  BILITOT 0.3  PROT 6.7  ALBUMIN 3.9   No results found for this basename: LIPASE, AMYLASE,  in the last 168 hours No results found for this basename: AMMONIA,  in the last 168 hours CBC:  Recent Labs Lab 07/15/13 0750  WBC 6.4  HGB 13.5  HCT 39.3  MCV 93.8  PLT 194   Cardiac Enzymes:  Recent Labs Lab 07/15/13 1542 07/15/13 1824 07/16/13 0045  TROPONINI <0.30 <0.30 <0.30   BNP: BNP (last 3 results) No results found for this basename: PROBNP,  in the last 8760 hours CBG: No results found for this basename: GLUCAP,  in the last 168 hours     Signed:  Kela Millin  [facility] 07/16/2013, 4:08 PM

## 2013-11-06 ENCOUNTER — Other Ambulatory Visit: Payer: Self-pay

## 2013-11-06 DIAGNOSIS — Z9012 Acquired absence of left breast and nipple: Secondary | ICD-10-CM

## 2013-11-06 DIAGNOSIS — Z853 Personal history of malignant neoplasm of breast: Secondary | ICD-10-CM

## 2013-11-06 DIAGNOSIS — Z1231 Encounter for screening mammogram for malignant neoplasm of breast: Secondary | ICD-10-CM

## 2013-11-30 ENCOUNTER — Ambulatory Visit
Admission: RE | Admit: 2013-11-30 | Discharge: 2013-11-30 | Disposition: A | Discharge status: Home / Self Care | Payer: Medicare Other | Source: Ambulatory Visit

## 2013-11-30 DIAGNOSIS — Z1231 Encounter for screening mammogram for malignant neoplasm of breast: Secondary | ICD-10-CM

## 2013-11-30 DIAGNOSIS — Z853 Personal history of malignant neoplasm of breast: Secondary | ICD-10-CM

## 2013-11-30 DIAGNOSIS — Z9012 Acquired absence of left breast and nipple: Secondary | ICD-10-CM

## 2015-01-08 ENCOUNTER — Encounter (HOSPITAL_BASED_OUTPATIENT_CLINIC_OR_DEPARTMENT_OTHER): Payer: Self-pay

## 2015-01-08 ENCOUNTER — Emergency Department (HOSPITAL_BASED_OUTPATIENT_CLINIC_OR_DEPARTMENT_OTHER)
Admission: EM | Admit: 2015-01-08 | Discharge: 2015-01-08 | Disposition: A | Discharge status: Home / Self Care | Payer: Medicare Other | Attending: Emergency Medicine | Admitting: Emergency Medicine

## 2015-01-08 ENCOUNTER — Emergency Department (HOSPITAL_BASED_OUTPATIENT_CLINIC_OR_DEPARTMENT_OTHER): Payer: Medicare Other

## 2015-01-08 DIAGNOSIS — S0083XA Contusion of other part of head, initial encounter: Secondary | ICD-10-CM | POA: Diagnosis not present

## 2015-01-08 DIAGNOSIS — R05 Cough: Secondary | ICD-10-CM | POA: Diagnosis not present

## 2015-01-08 DIAGNOSIS — Z7982 Long term (current) use of aspirin: Secondary | ICD-10-CM | POA: Insufficient documentation

## 2015-01-08 DIAGNOSIS — Y9289 Other specified places as the place of occurrence of the external cause: Secondary | ICD-10-CM | POA: Diagnosis not present

## 2015-01-08 DIAGNOSIS — W01198A Fall on same level from slipping, tripping and stumbling with subsequent striking against other object, initial encounter: Secondary | ICD-10-CM | POA: Insufficient documentation

## 2015-01-08 DIAGNOSIS — W19XXXA Unspecified fall, initial encounter: Secondary | ICD-10-CM

## 2015-01-08 DIAGNOSIS — I10 Essential (primary) hypertension: Secondary | ICD-10-CM | POA: Insufficient documentation

## 2015-01-08 DIAGNOSIS — G7000 Myasthenia gravis without (acute) exacerbation: Secondary | ICD-10-CM | POA: Diagnosis not present

## 2015-01-08 DIAGNOSIS — Z862 Personal history of diseases of the blood and blood-forming organs and certain disorders involving the immune mechanism: Secondary | ICD-10-CM | POA: Insufficient documentation

## 2015-01-08 DIAGNOSIS — M1990 Unspecified osteoarthritis, unspecified site: Secondary | ICD-10-CM | POA: Diagnosis not present

## 2015-01-08 DIAGNOSIS — R062 Wheezing: Secondary | ICD-10-CM | POA: Insufficient documentation

## 2015-01-08 DIAGNOSIS — R053 Chronic cough: Secondary | ICD-10-CM

## 2015-01-08 DIAGNOSIS — Z79899 Other long term (current) drug therapy: Secondary | ICD-10-CM | POA: Diagnosis not present

## 2015-01-08 DIAGNOSIS — Y998 Other external cause status: Secondary | ICD-10-CM | POA: Insufficient documentation

## 2015-01-08 DIAGNOSIS — Z853 Personal history of malignant neoplasm of breast: Secondary | ICD-10-CM | POA: Diagnosis not present

## 2015-01-08 DIAGNOSIS — Y9301 Activity, walking, marching and hiking: Secondary | ICD-10-CM | POA: Diagnosis not present

## 2015-01-08 LAB — BASIC METABOLIC PANEL
Anion gap: 6 (ref 5–15)
BUN: 10 mg/dL (ref 6–23)
CO2: 29 mmol/L (ref 19–32)
Calcium: 9.6 mg/dL (ref 8.4–10.5)
Chloride: 106 mmol/L (ref 96–112)
Creatinine, Ser: 0.76 mg/dL (ref 0.50–1.10)
GFR calc Af Amer: 86 mL/min — ABNORMAL LOW (ref >90)
GFR calc non Af Amer: 74 mL/min — ABNORMAL LOW (ref >90)
Glucose, Bld: 105 mg/dL — ABNORMAL HIGH (ref 70–99)
Potassium: 4.7 mmol/L (ref 3.5–5.1)
SODIUM: 141 mmol/L (ref 135–145)

## 2015-01-08 LAB — CBC WITH DIFFERENTIAL/PLATELET
Basophils Absolute: 0.2 10*3/uL — ABNORMAL HIGH (ref 0.0–0.1)
Basophils Relative: 2 % — ABNORMAL HIGH (ref 0–1)
Eosinophils Absolute: 1.2 10*3/uL — ABNORMAL HIGH (ref 0.0–0.7)
Eosinophils Relative: 15 % — ABNORMAL HIGH (ref 0–5)
HEMATOCRIT: 42.8 % (ref 36.0–46.0)
HEMOGLOBIN: 14.1 g/dL (ref 12.0–15.0)
LYMPHS ABS: 3.1 10*3/uL (ref 0.7–4.0)
LYMPHS PCT: 37 % (ref 12–46)
MCH: 31.9 pg (ref 26.0–34.0)
MCHC: 32.9 g/dL (ref 30.0–36.0)
MCV: 96.8 fL (ref 78.0–100.0)
MONO ABS: 0.7 10*3/uL (ref 0.1–1.0)
Monocytes Relative: 8 % (ref 3–12)
Neutro Abs: 3.1 10*3/uL (ref 1.7–7.7)
Neutrophils Relative %: 38 % — ABNORMAL LOW (ref 43–77)
Platelets: 189 10*3/uL (ref 150–400)
RBC: 4.42 MIL/uL (ref 3.87–5.11)
RDW: 12.1 % (ref 11.5–15.5)
WBC: 8.2 10*3/uL (ref 4.0–10.5)

## 2015-01-08 LAB — BRAIN NATRIURETIC PEPTIDE: B Natriuretic Peptide: 56.7 pg/mL (ref 0.0–100.0)

## 2015-01-08 NOTE — ED Provider Notes (Signed)
CSN: [id number]     Arrival date & time 01/08/15  1201 History   First MD Initiated Contact with Patient 01/08/15 1218     Chief Complaint  Patient presents with  . Fall  . Cough      HPI Patient states that she fell about 2 weeks ago striking her left jaw on the vacum cleaner. Some bruising noted on her left lower jaw as well as a palpable hematoma. She denies pain at the site and denies any further injury. She also C/O having a productive cough for 4 to 5 months. States that it is productive but cannot bring anything up. States that the cough is mostly at night.  Past Medical History  Diagnosis Date  . Arthritis   . Hypertension   . Immune deficiency disorder   . Myasthenia gravis   . lt breast ca dx'd 1978 or 1988 pt unsure    lt masectomy; tamoxifen completed  . Complication of anesthesia     hard to wake up   Past Surgical History  Procedure Laterality Date  . Masectomny Left     1978  . Breast surgery    . Appendectomy    . Eye surgery      lens implant  . Mastectomy    . Abdominal hysterectomy    . Hemorrhoid surgery     Family History  Problem Relation Age of Onset  . Cancer Father   . Cancer Brother    History  Substance Use Topics  . Smoking status: Never Smoker   . Smokeless tobacco: Never Used  . Alcohol Use: No   OB History    No data available     Review of Systems  All other systems reviewed and are negative.     Allergies  Ciprofloxacin; Doxycycline hyclate; and Morphine and related  Home Medications   Prior to Admission medications   Medication Sig Start Date End Date Taking? Authorizing Provider  albuterol (PROVENTIL HFA;VENTOLIN HFA) 108 (90 BASE) MCG/ACT inhaler Inhale 2 puffs into the lungs 4 (four) times daily.   Yes Historical Provider, MD  dextromethorphan-guaiFENesin (MUCINEX DM) 30-600 MG per 12 hr tablet Take 1 tablet by mouth 2 (two) times daily.   Yes Historical Provider, MD  acetaminophen (TYLENOL) 325 MG tablet Take  2 tablets (650 mg total) by mouth every 6 (six) hours as needed. 07/16/13   Sheila Oats, MD  alendronate (FOSAMAX) 70 MG tablet Take 70 mg by mouth every 7 (seven) days. Sunday. Take with a full glass of water on an empty stomach.    Historical Provider, MD  amLODipine (NORVASC) 5 MG tablet Take 5 mg by mouth daily.    Historical Provider, MD  aspirin EC 81 MG tablet Take 81 mg by mouth daily.    Historical Provider, MD  calcium carbonate (TUMS - DOSED IN MG ELEMENTAL CALCIUM) 500 MG chewable tablet Chew 1 tablet by mouth every evening.    Historical Provider, MD  cholecalciferol (VITAMIN D) 1000 UNITS tablet Take 1,000 Units by mouth daily.    Historical Provider, MD  cyanocobalamin (,VITAMIN B-12,) 1000 MCG/ML injection Inject 1,000 mcg into the muscle every 30 (thirty) days.    Historical Provider, MD  diclofenac sodium (VOLTAREN) 1 % GEL Apply 2 g topically daily as needed. For pain    Historical Provider, MD  latanoprost (XALATAN) 0.005 % ophthalmic solution Place 1 drop into both eyes at bedtime.    Historical Provider, MD  omega-3 acid ethyl esters (  LOVAZA) 1 G capsule Take 1 g by mouth daily.    Historical Provider, MD  omeprazole (PRILOSEC) 20 MG capsule Take 2 capsules (40 mg total) by mouth daily. 07/16/13   Sheila Oats, MD  oxybutynin (DITROPAN) 5 MG tablet Take 5 mg by mouth 2 (two) times daily.    Historical Provider, MD  pyridostigmine (MESTINON) 60 MG tablet Take 90 mg by mouth 2 (two) times daily.    Historical Provider, MD  senna-docusate (SENNA PLUS) 8.6-50 MG per tablet Take 1 tablet by mouth every other day.    Historical Provider, MD  SIMBRINZA 1-0.2 % SUSP  06/30/13   Historical Provider, MD  simvastatin (ZOCOR) 40 MG tablet Take 40 mg by mouth every evening.    Historical Provider, MD   BP 165/46 mmHg  Pulse 61  Temp(Src) 98.5 F (36.9 C)  Resp 19  Wt 135 lb (61.236 kg)  SpO2 94% Physical Exam  Constitutional: She is oriented to person, place, and time. She  appears well-developed and well-nourished. No distress.  HENT:  Head: Normocephalic and atraumatic.    Eyes: Pupils are equal, round, and reactive to light.  Neck: Normal range of motion.  Cardiovascular: Normal rate and intact distal pulses.   Pulmonary/Chest: No accessory muscle usage. No respiratory distress. She has wheezes.  Abdominal: Normal appearance. She exhibits no distension.  Musculoskeletal: Normal range of motion.  Neurological: She is alert and oriented to person, place, and time. No cranial nerve deficit.  Skin: Skin is warm and dry. No rash noted.  Psychiatric: She has a normal mood and affect. Her behavior is normal.  Nursing note and vitals reviewed.   ED Course  Procedures (including critical care time) Labs Review Labs Reviewed  BASIC METABOLIC PANEL - Abnormal; Notable for the following:    Glucose, Bld 105 (*)    GFR calc non Af Amer 74 (*)    GFR calc Af Amer 86 (*)    All other components within normal limits  CBC WITH DIFFERENTIAL/PLATELET - Abnormal; Notable for the following:    Neutrophils Relative % 38 (*)    Eosinophils Relative 15 (*)    Eosinophils Absolute 1.2 (*)    Basophils Relative 2 (*)    Basophils Absolute 0.2 (*)    All other components within normal limits  BRAIN NATRIURETIC PEPTIDE    Imaging Review Dg Mandible 4 Views  01/08/2015   CLINICAL DATA:  Fall 2 weeks ago. LEFT mandibular injury. Bruising and swelling but no mandibular pain.  EXAM: MANDIBLE - 4+ VIEW  COMPARISON:  None.  FINDINGS: The patient is edentulous. Study is degraded by projection however there is no displaced mandibular fracture identified. Grossly, the mandibular condyles appear located. Paranasal sinuses appear within normal limits.  IMPRESSION: No displaced mandibular fracture.   Electronically Signed   By: Dereck Ligas M.D.   On: 01/08/2015 13:07   Dg Chest 2 View  01/08/2015   CLINICAL DATA:  Cough and wheezing for 5 months.  Chronic cough.  EXAM: CHEST  2  VIEW  COMPARISON:  07/15/2013 CT pulmonary angiogram.  FINDINGS: Cardiopericardial silhouette within normal limits. Chronic bronchitic changes most evident in the RIGHT infrahilar region appears similar to the prior exam. Scout images from chest CT used as comparison. No airspace consolidation. No pleural effusion. Mildly tortuous thoracic aorta. Aortic arch atherosclerosis.  IMPRESSION: No active cardiopulmonary disease.   Electronically Signed   By: Dereck Ligas M.D.   On: 01/08/2015 13:06  Patient has an appointment with the pulmonologist coming up in a few weeks.  She will follow-up with her cough complaint with them.  MDM   Final diagnoses:  Fall  Chronic cough        Leonard Schwartz, MD 01/08/15 1349

## 2015-01-08 NOTE — ED Notes (Signed)
Pt reports mechanical fall 2 weeks ago, states she was walking up stairs and fell when she got to top onto L jaw area, bruising and swelling to same, no other injury from this per pt.  No loc.  Ambulatory without difficulty.  Also reports would like to be evaluated for a productive cough that she has had for 4-5 months, no fever with same.

## 2015-01-08 NOTE — ED Notes (Signed)
Patient states that she fell about 2 weeks ago striking her left jaw on the vacum cleaner. Some bruising noted on her left lower jaw as well as a palpable hematoma.  She denies pain at the site and denies any further injury. She also C/O having a productive cough for 4 to 5 months.  States that it is productive but cannot bring anything up.  States that the cough is mostly at night.

## 2015-01-08 NOTE — Discharge Instructions (Signed)
Cough, Adult   A cough is a reflex. It helps you clear your throat and airways. A cough can help heal your body. A cough can last 2 or 3 weeks (acute) or may last more than 8 weeks (chronic). Some common causes of a cough can include an infection, allergy, or a cold.  HOME CARE  · Only take medicine as told by your doctor.  · If given, take your medicines (antibiotics) as told. Finish them even if you start to feel better.  · Use a cold steam vaporizer or humidifier in your home. This can help loosen thick spit (secretions).  · Sleep so you are almost sitting up (semi-upright). Use pillows to do this. This helps reduce coughing.  · Rest as needed.  · Stop smoking if you smoke.  GET HELP RIGHT AWAY IF:  · You have yellowish-white fluid (pus) in your thick spit.  · Your cough gets worse.  · Your medicine does not reduce coughing, and you are losing sleep.  · You cough up blood.  · You have trouble breathing.  · Your pain gets worse and medicine does not help.  · You have a fever.  MAKE SURE YOU:   · Understand these instructions.  · Will watch your condition.  · Will get help right away if you are not doing well or get worse.  Document Released: 05/17/2011 Document Revised: 01/18/2014 Document Reviewed: 05/17/2011  ExitCare® Patient Information ©2015 [employer]. This information is not intended to replace advice given to you by your health care provider. Make sure you discuss any questions you have with your health care provider.

## 2015-01-08 NOTE — ED Notes (Signed)
MD at bedside. 

## 2015-02-16 ENCOUNTER — Other Ambulatory Visit: Payer: Self-pay

## 2015-02-16 DIAGNOSIS — C50912 Malignant neoplasm of unspecified site of left female breast: Secondary | ICD-10-CM

## 2015-02-16 DIAGNOSIS — Z1231 Encounter for screening mammogram for malignant neoplasm of breast: Secondary | ICD-10-CM

## 2015-02-16 DIAGNOSIS — Z9012 Acquired absence of left breast and nipple: Secondary | ICD-10-CM

## 2015-02-22 ENCOUNTER — Ambulatory Visit
Admission: RE | Admit: 2015-02-22 | Discharge: 2015-02-22 | Disposition: A | Discharge status: Home / Self Care | Payer: Medicare Other | Source: Ambulatory Visit

## 2015-02-22 DIAGNOSIS — Z9012 Acquired absence of left breast and nipple: Secondary | ICD-10-CM

## 2015-02-22 DIAGNOSIS — C50912 Malignant neoplasm of unspecified site of left female breast: Secondary | ICD-10-CM

## 2015-02-22 DIAGNOSIS — Z1231 Encounter for screening mammogram for malignant neoplasm of breast: Secondary | ICD-10-CM

## 2015-03-02 ENCOUNTER — Encounter: Payer: Self-pay | Admitting: Internal Medicine

## 2015-03-02 ENCOUNTER — Ambulatory Visit (INDEPENDENT_AMBULATORY_CARE_PROVIDER_SITE_OTHER): Payer: Medicare Other | Admitting: Internal Medicine

## 2015-03-02 VITALS — BP 124/62 | HR 67 | Ht 62.0 in | Wt 133.8 lb

## 2015-03-02 DIAGNOSIS — R058 Other specified cough: Secondary | ICD-10-CM | POA: Insufficient documentation

## 2015-03-02 DIAGNOSIS — R05 Cough: Secondary | ICD-10-CM

## 2015-03-02 MED ORDER — PREDNISONE 10 MG PO TABS: ORAL_TABLET | ORAL | Status: DC

## 2015-03-02 NOTE — Progress Notes (Signed)
Subjective:    Patient ID: Elaine Owens, female    DOB: Jan 26, 1926,    MRN: [id number]  HPI  48 yowf never smoker used to garden had to stop due to arthritis and not very active since her mid 56s and sinus problems spring and fall  with new cough Oct 2015 waxed and weaned since then and referred to pulmonary clinic 03/02/2015 by Ursula Alert    03/02/2015 1st [facility] office visit/ Roman Sandall   Chief Complaint  Patient presents with  . Pulmonary Consult    Referred by Dr. Glo Herring. Pt c/o SOB and cough for the past 8 months- symptoms occur mainly at night but sometimes gets SOB during the day with exertion. She was SOB walking from parking lot to building today. Her cough is prod with minimal yellow sputum.    cough came on gradually in Oct 2015 s assoc abrupt illness/ uri, and persistent daily since:  worse when lie down at bedtime has to sit up 30 degrees - breathing much better when not coughing/ no better with inhalers but not sure she ever got any in her effectively per daughter.  Assoc with sensation of sorethroat/ something stuck in throat but can't cough it out. Some dysphagia but no frank asp to date - does have intermittent HB takes prn ppi  ? Some better temporarily p zpak/ pred.   No obvious day to day or daytime variabilty or assoc   cp or chest tightness, subjective wheeze overt sinus  symptoms. No unusual exp hx or h/o childhood pna/ asthma or knowledge of premature birth.  Sleeping ok without nocturnal  or early am exacerbation  of respiratory  c/o's or need for noct saba. Also denies any obvious fluctuation of symptoms with weather or environmental changes or other aggravating or alleviating factors except as outlined above   Current Medications, Allergies, Complete Past Medical History, Past Surgical History, Family History, and Social History were reviewed in [employer] record.  Already rx by pulmonary in [place] "got no where"  per pt/ daughter: records show spirometry was nl,  Symptoms better with mdi though pt denies                Review of Systems  Constitutional: Negative for fever, chills and unexpected weight change.  HENT: Positive for sneezing and sore throat. Negative for congestion, dental problem, ear pain, nosebleeds, postnasal drip, rhinorrhea, sinus pressure, trouble swallowing and voice change.   Eyes: Negative for visual disturbance.  Respiratory: Positive for cough and shortness of breath. Negative for choking.   Cardiovascular: Negative for chest pain and leg swelling.  Gastrointestinal: Negative for vomiting, abdominal pain and diarrhea.  Genitourinary: Negative for difficulty urinating.  Musculoskeletal: Negative for arthralgias.  Skin: Negative for rash.  Neurological: Negative for tremors, syncope and headaches.  Hematological: Does not bruise/bleed easily.       Objective:   Physical Exam   Amb wf prominent throat clearing/ pseudowheezing   Wt Readings from Last 3 Encounters:  03/02/15 133 lb 12.8 oz (60.691 kg)  01/08/15 135 lb (61.236 kg)  07/16/13 131 lb 4.8 oz (59.557 kg)    Vital signs reviewed   HEENT:  Full dentures/ nl  turbinates, and orophanx. Nl external ear canals without cough reflex   NECK :  without JVD/Nodes/TM/ nl carotid upstrokes bilaterally - no stridor   LUNGS: no acc muscle use, clear to A and P bilaterally without cough on insp or exp maneuvers- lots of  transmitted upper airway noise   CV:  RRR  no s3 or murmur or increase in P2, no edema   ABD:  soft and nontender with nl excursion in the supine position. No bruits or organomegaly, bowel sounds nl  MS:  warm without deformities, calf tenderness, cyanosis or clubbing  SKIN: warm and dry without lesions    NEURO:  alert, approp, no deficits      I personally reviewed images and agree with radiology impression as follows:  CXR:  01/08/15 Cardiopericardial silhouette within normal limits.  Chronic bronchitic changes most evident in the RIGHT infrahilar region appears similar to the prior exam. Scout images from chest CT used as comparison. No airspace consolidation. No pleural effusion. Mildly tortuous thoracic aorta. Aortic arch atherosclerosis.  IMPRESSION: No active cardiopulmonary disease.       Assessment & Plan:

## 2015-03-02 NOTE — Patient Instructions (Signed)
Stop fish oil and fosfamax  Try prilosec 20mg   Take x 2= 40 mg  x  30-60 min before first meal of the day and Pepcid (famotidine) 20 mg one bedtime until return  Prednisone 10 mg take  4 each am x 2 days,   2 each am x 2 days,  1 each am x 2 days and stop   GERD (REFLUX)  is an extremely common cause of respiratory symptoms just like yours , many times with no obvious heartburn at all.    It can be treated with medication, but also with lifestyle changes including elevation of the head of your bed (ideally with 6 inch  bed blocks),  Smoking cessation, avoidance of late meals, excessive alcohol, and avoid fatty foods, chocolate, peppermint, colas, red wine, and acidic juices such as orange juice.  NO MINT OR MENTHOL PRODUCTS SO NO COUGH DROPS  USE SUGARLESS CANDY INSTEAD (Jolley ranchers or Stover's or Life Savers) or even ice chips will also do - the key is to swallow to prevent all throat clearing. NO OIL BASED VITAMINS - use powdered substitutes.   Please see patient coordinator before you leave today  to schedule sinus CT  Please remember to go to the lab   department downstairs for your tests - we will call you with the results when they are available.  See Tammy NP w/in 2-3 weeks with all your medications, even over the counter meds, separated in two separate bags, the ones you take no matter what vs the ones you stop once you feel better and take only as needed when you feel you need them.   Tammy  will generate for you a new user friendly medication calendar that will put [place] all on the same page re: your medication use.     Without this process, it simply isn't possible to assure that we are providing  your outpatient care  with  the attention to detail we feel you deserve.   If we cannot assure that you're getting that kind of care,  then we cannot manage your problem effectively from this clinic.  Once you have seen Tammy and we are sure that we're all on the same page with your  medication use she will arrange follow up with me.

## 2015-03-03 NOTE — Assessment & Plan Note (Addendum)
The most common causes of chronic cough in immunocompetent adults include the following: upper airway cough syndrome (UACS), previously referred to as postnasal drip syndrome (PNDS), which is caused by variety of rhinosinus conditions; (2) asthma; (3) GERD; (4) chronic bronchitis from cigarette smoking or other inhaled environmental irritants; (5) nonasthmatic eosinophilic bronchitis; and (6) bronchiectasis.   These conditions, singly or in combination, have accounted for up to 94% of the causes of chronic cough in prospective studies.   Other conditions have constituted no >6% of the causes in prospective studies These have included bronchogenic carcinoma, chronic interstitial pneumonia, sarcoidosis, left ventricular failure, ACEI-induced cough, and aspiration from a condition associated with pharyngeal dysfunction.    Chronic cough is often simultaneously caused by more than one condition. A single cause has been found from 38 to 82% of the time, multiple causes from 18 to 62%. Multiply caused cough has been the result of three diseases up to 42% of the time.       Based on hx and exam, this is most likely:  Classic Upper airway cough syndrome, so named because it's frequently impossible to sort out how much is  CR/sinusitis with freq throat clearing (which can be related to primary GERD)   vs  causing  secondary (" extra esophageal")  GERD from wide swings in gastric pressure that occur with throat clearing, often  promoting self use of mint and menthol lozenges that reduce the lower esophageal sphincter tone and exacerbate the problem further in a cyclical fashion.   These are the same pts (now being labeled as having "irritable larynx syndrome" by some cough centers) who not infrequently have a history of having failed to tolerate ace inhibitors,  dry powder inhalers or biphosphonates or report having atypical reflux symptoms that don't respond to standard doses of PPI , and are easily confused as  having aecopd or asthma flares by even experienced allergists/ pulmonologists.   The first step is to maximize acid suppression and eliminate sinus dz from ddz with sinus ct/ w/u for allergic component (doubt) and very short course of pred then  regroup - cough variant asthma still in ddx  I had an extended discussion with the patient and daughter reviewing all relevant studies completed to date and  Lasting 4 m Each maintenance medication was reviewed in detail including most importantly the difference between maintenance and as needed and under what circumstances the prns are to be used.  Please see instructions for details which were reviewed in writing and the patient given a copy.

## 2015-03-10 ENCOUNTER — Ambulatory Visit (INDEPENDENT_AMBULATORY_CARE_PROVIDER_SITE_OTHER)
Admission: RE | Admit: 2015-03-10 | Discharge: 2015-03-10 | Disposition: A | Discharge status: Home / Self Care | Payer: Medicare Other | Source: Ambulatory Visit | Attending: Internal Medicine | Admitting: Internal Medicine

## 2015-03-10 DIAGNOSIS — R05 Cough: Secondary | ICD-10-CM

## 2015-03-10 DIAGNOSIS — R058 Other specified cough: Secondary | ICD-10-CM

## 2015-03-11 ENCOUNTER — Telehealth: Payer: Self-pay | Admitting: Internal Medicine

## 2015-03-11 NOTE — Progress Notes (Signed)
Quick Note:  ATC, NA and no option to leave a msg, WCB ______ 

## 2015-03-11 NOTE — Telephone Encounter (Signed)
Patient notified of CT results.  Nothing further needed. 

## 2015-03-14 ENCOUNTER — Telehealth: Payer: Self-pay | Admitting: Internal Medicine

## 2015-03-14 NOTE — Telephone Encounter (Signed)
Called spoke w/ pt. She reports she did not have her lab work drawn. I advised her the days/times lab is open. She will have these drawn. Nothing further needed

## 2015-03-15 ENCOUNTER — Other Ambulatory Visit (INDEPENDENT_AMBULATORY_CARE_PROVIDER_SITE_OTHER): Payer: Medicare Other

## 2015-03-15 DIAGNOSIS — R058 Other specified cough: Secondary | ICD-10-CM

## 2015-03-15 DIAGNOSIS — R05 Cough: Secondary | ICD-10-CM

## 2015-03-15 LAB — CBC WITH DIFFERENTIAL/PLATELET
BASOS ABS: 0.1 10*3/uL (ref 0.0–0.1)
Basophils Relative: 0.9 % (ref 0.0–3.0)
EOS ABS: 1 10*3/uL — AB (ref 0.0–0.7)
Eosinophils Relative: 11.5 % — ABNORMAL HIGH (ref 0.0–5.0)
HEMATOCRIT: 42 % (ref 36.0–46.0)
Hemoglobin: 14.2 g/dL (ref 12.0–15.0)
LYMPHS ABS: 3.1 10*3/uL (ref 0.7–4.0)
Lymphocytes Relative: 37.4 % (ref 12.0–46.0)
MCHC: 33.8 g/dL (ref 30.0–36.0)
MCV: 94.5 fl (ref 78.0–100.0)
MONO ABS: 0.7 10*3/uL (ref 0.1–1.0)
MONOS PCT: 8.8 % (ref 3.0–12.0)
NEUTROS ABS: 3.4 10*3/uL (ref 1.4–7.7)
NEUTROS PCT: 41.4 % — AB (ref 43.0–77.0)
PLATELETS: 203 10*3/uL (ref 150.0–400.0)
RBC: 4.44 Mil/uL (ref 3.87–5.11)
RDW: 13 % (ref 11.5–15.5)
WBC: 8.2 10*3/uL (ref 4.0–10.5)

## 2015-03-16 LAB — ALLERGY FULL PROFILE
Allergen, D pternoyssinus,d7: <0.1 kU/L
Alternaria Alternata: <0.1 kU/L
Aspergillus fumigatus, m3: <0.1 kU/L
Bahia Grass: <0.1 kU/L
Bermuda Grass: <0.1 kU/L
Box Elder IgE: <0.1 kU/L
Cat Dander: <0.1 kU/L
Common Ragweed: <0.1 kU/L
Curvularia lunata: <0.1 kU/L
Elm IgE: <0.1 kU/L
Fescue: <0.1 kU/L
G005 Rye, Perennial: <0.1 kU/L
G009 Red Top: <0.1 kU/L
Goldenrod: <0.1 kU/L
House Dust Hollister: <0.1 kU/L
IgE (Immunoglobulin E), Serum: 30 kU/L (ref <115)
Oak: <0.1 kU/L
Stemphylium Botryosum: <0.1 kU/L
Timothy Grass: <0.1 kU/L

## 2015-03-16 NOTE — Progress Notes (Signed)
Quick Note:  ATC, NA and VM not set up WCB ______

## 2015-03-17 NOTE — Progress Notes (Signed)
Quick Note:  Called and spoke to pt. Informed her of the results and recs per MW. Pt verbalized understanding and stated she is doing well but still has a cough. Pt has appt with TP on 7/11. Pt aware to keep f/u. Nothing further needed at this time. ______

## 2015-03-28 ENCOUNTER — Encounter: Payer: Self-pay | Admitting: Adult Health

## 2015-03-28 ENCOUNTER — Ambulatory Visit (INDEPENDENT_AMBULATORY_CARE_PROVIDER_SITE_OTHER): Payer: Medicare Other | Admitting: Adult Health

## 2015-03-28 VITALS — BP 140/70 | HR 96 | Temp 97.6°F | Wt 134.0 lb

## 2015-03-28 DIAGNOSIS — R05 Cough: Secondary | ICD-10-CM

## 2015-03-28 DIAGNOSIS — R058 Other specified cough: Secondary | ICD-10-CM

## 2015-03-28 MED ORDER — HYDROCODONE-HOMATROPINE 5-1.5 MG/5ML PO SYRP: 2.5000 mL | ORAL_SOLUTION | Freq: Every evening | ORAL | Status: DC | PRN

## 2015-03-28 NOTE — Assessment & Plan Note (Signed)
Chronic cough ? Triggers of GERD/AR  CT sinus neg.  Allergy profile , neg RAST , IgE ok .  CXR ok  Cont w/ trigger control Patient's medications were reviewed today and patient education was given. Computerized medication calendar was adjusted/completed   Plan  Follow med calendar closely and bring to each visit  Use Delsym 2 tsp Twice daily for cough  May use Hydromet 1/2-1 tsp at bedtime as needed , may make you sleepy.  follow up Dr. Melvyn Novas  In 3-4 weeks with PFT  Please contact office for sooner follow up if symptoms do not improve or worsen or seek emergency care

## 2015-03-28 NOTE — Patient Instructions (Signed)
Follow med calendar closely and bring to each visit  Use Delsym 2 tsp Twice daily for cough  May use Hydromet 1/2-1 tsp at bedtime as needed , may make you sleepy.  follow up Dr. Melvyn Novas  In 3-4 weeks with PFT  Please contact office for sooner follow up if symptoms do not improve or worsen or seek emergency care

## 2015-03-28 NOTE — Progress Notes (Signed)
Chart and office note reviewed in detail  > agree with a/p as outlined    

## 2015-03-28 NOTE — Progress Notes (Signed)
Subjective:    Patient ID: Elaine Owens, female    DOB: April 04, 1926,    MRN: [id number]  HPI 6 yowf never smoker used to garden had to stop due to arthritis and not very active since her mid 53s and sinus problems spring and fall  with new cough Oct 2015 waxed and weaned since then and referred to pulmonary clinic 03/02/2015 by Ursula Alert    03/02/2015 1st [facility] office visit/ W[facility]   Chief Complaint  Patient presents with  . Pulmonary Consult    Referred by Dr. Glo Herring. Pt c/o SOB and cough for the past 8 months- symptoms occur mainly at night but sometimes gets SOB during the day with exertion. She was SOB walking from parking lot to building today. Her cough is prod with minimal yellow sputum.    cough came on gradually in Oct 2015 s assoc abrupt illness/ uri, and persistent daily since:  worse when lie down at bedtime has to sit up 30 degrees - breathing much better when not coughing/ no better with inhalers but not sure she ever got any in her effectively per daughter.  Assoc with sensation of sorethroat/ something stuck in throat but can't cough it out. Some dysphagia but no frank asp to date - does have intermittent HB takes prn ppi  ? Some better temporarily p zpak/ pred.  >>pred taper , stop fosamax.   03/28/2015 Follow up and med review : Cough Returns for 1 month follow up .  Seen last ov for cough and dyspnea.  Has had cough for 9 months.  Has tried delsym , mucinex, robitussium without any help.  Felt better on prednisone but cough return when stopped prednisone .   tx w/ pred taper . Fosamax held.  CT sinus done which was normal.  Cough comes and goes. No known trigger.  No sign change in cough.  Denies fever, chest pain, orthopnea, or edema.   We reviewed all her meds and organized them into a med calendar with pt education  Appears to be taking correctly.  Has MG on Mestinon .   Current Medications, Allergies, Complete Past Medical History,  Past Surgical History, Family History, and Social History were reviewed in [employer] record.     Review of Systems  Constitutional: Negative for fever, chills and unexpected weight change.  HENT: . Negative for congestion, dental problem, ear pain, nosebleeds, postnasal drip, rhinorrhea, sinus pressure, trouble swallowing and voice change.   Eyes: Negative for visual disturbance.  Respiratory: Positive for cough  . Negative for choking.   Cardiovascular: Negative for chest pain and leg swelling.  Gastrointestinal: Negative for vomiting, abdominal pain and diarrhea.  Genitourinary: Negative for difficulty urinating.  Musculoskeletal: Negative for arthralgias.  Skin: Negative for rash.  Neurological: Negative for tremors, syncope and headaches.  Hematological: Does not bruise/bleed easily.       Objective:   Physical Exam   Amb wf      Vital signs reviewed   HEENT:  Full dentures/ nl  turbinates, and orophanx. Nl external ear canals without cough reflex   NECK :  without JVD/Nodes/TM/ nl carotid upstrokes bilaterally - no stridor   LUNGS: no acc muscle use, clear to A and P bilaterally without cough on insp or exp maneuvers- lots of transmitted upper airway noise   CV:  RRR  no s3 or murmur or increase in P2, no edema   ABD:  soft and nontender with nl excursion  in the supine position. No bruits or organomegaly, bowel sounds nl  MS:  warm without deformities, calf tenderness, cyanosis or clubbing  SKIN: warm and dry without lesions    NEURO:  alert, approp, no deficits      I personally reviewed images and agree with radiology impression as follows:  CXR:  01/08/15 Cardiopericardial silhouette within normal limits. Chronic bronchitic changes most evident in the RIGHT infrahilar region appears similar to the prior exam. Scout images from chest CT used as comparison. No airspace consolidation. No pleural effusion. Mildly tortuous thoracic  aorta. Aortic arch atherosclerosis.  IMPRESSION: No active cardiopulmonary disease.       Assessment & Plan:

## 2015-03-29 ENCOUNTER — Telehealth: Payer: Self-pay | Admitting: Adult Health

## 2015-03-29 NOTE — Addendum Note (Signed)
Addended by: Osa Craver on: 03/29/2015 01:05 PM   Modules accepted: Orders, Medications

## 2015-03-29 NOTE — Addendum Note (Signed)
Addended by: Osa Craver on: 03/29/2015 01:38 PM   Modules accepted: Orders, Medications

## 2015-03-29 NOTE — Telephone Encounter (Signed)
Spoke with pt. Updated medication list. Nothing further needed

## 2015-04-07 ENCOUNTER — Telehealth: Payer: Self-pay | Admitting: Internal Medicine

## 2015-04-07 NOTE — Telephone Encounter (Signed)
Called spoke with pt. She does have a med calendar but is confused bc she is also comparing this to the med list printed on her AVS from visit. I advised pt she is to disregard the med list from AVS and go by her medication calendar. She understood and had no further questions. Nothing further needed

## 2015-04-22 ENCOUNTER — Ambulatory Visit (INDEPENDENT_AMBULATORY_CARE_PROVIDER_SITE_OTHER): Payer: Medicare Other | Admitting: Pulmonary Disease

## 2015-04-22 ENCOUNTER — Encounter: Payer: Self-pay | Admitting: Pulmonary Disease

## 2015-04-22 VITALS — BP 122/62 | HR 65 | Ht 62.0 in | Wt 133.0 lb

## 2015-04-22 DIAGNOSIS — R05 Cough: Secondary | ICD-10-CM | POA: Diagnosis not present

## 2015-04-22 DIAGNOSIS — R053 Chronic cough: Secondary | ICD-10-CM

## 2015-04-22 MED ORDER — FLUTICASONE PROPIONATE 50 MCG/ACT NA SUSP: 2.0000 | Freq: Every day | NASAL | Status: DC

## 2015-04-22 MED ORDER — ALBUTEROL SULFATE (2.5 MG/3ML) 0.083% IN NEBU: 2.5000 mg | INHALATION_SOLUTION | Freq: Four times a day (QID) | RESPIRATORY_TRACT | Status: DC | PRN

## 2015-04-22 NOTE — Progress Notes (Signed)
Chief Complaint  Patient presents with  . Acute Visit    [facility] PT. Pt c/o prod cough (yellow phlem), wheezing, increase SOB w/ exeriton, occas runny nose/watery eyes    History of Present Illness: Elaine Owens is a 81 y.o. female with upper airway cough syndrome.  She is followed by Dr. Melvyn Novas.  She continues to have a cough.  She feels like she gets something in her throat.  She tries to cough and clear her throat.  She will sometimes bring up clear sputum.  She was told by home health nurse that she wheezes and needs medicines for COPD.  She has PFT set up for later this month.  She denies fever, hemoptysis, reflux, or chest pain.  She used nose sprays before and these helped.  She feels cough medicines have helped, but burn her throat when she swallows them.    CXR from 01/08/15 was unrevealing, and CT sinus from 03/10/15 did not show sinusitis.  Past medical hx, Past surgical hx, Medications, Allergies, Family hx, Social hx all reviewed.   Physical Exam: BP 122/62 mmHg  Pulse 65  Ht 5\' 2"  (1.575 m)  Wt 133 lb (60.328 kg)  BMI 24.32 kg/m2  SpO2 95%  General - No distress ENT - No sinus tenderness, no oral exudate, no LAN, clear nasal discharge Cardiac - s1s2 regular, no murmur Chest - No wheeze/rales/dullness Back - No focal tenderness Abd - Soft, non-tender Ext - No edema Neuro - Normal strength Skin - No rashes Psych - normal mood, and behavior   Assessment/Plan:  Chronic cough most likely related to upper airway cough syndrome Plan: - Avoid forcing cough or clearing your throat - Sip water when you have the urge to cough - Use sugarless candy to keep your mouth moist - Use salt water gargles once or twice per day - Use nasal irrigation (saline sinus rinse) daily for at least two weeks, then as needed - Use flonase two sprays in each nostril daily  - Albuterol nebulizer every 4 to 6 hours as needed for wheezing, cough, or chest congestion - f/u with PFT's scheduled  with Dr. Lenetta Quaker, MD [facility] Pager:  [phone]

## 2015-04-22 NOTE — Patient Instructions (Signed)
Avoid forcing cough or clearing your throat Sip water when you have the urge to cough Use sugarless candy to keep your mouth moist Use salt water gargles once or twice per day Use nasal irrigation (saline sinus rinse) daily for at least two weeks, then as needed Use flonase two sprays in each nostril daily  Albuterol nebulizer every 4 to 6 hours as needed for wheezing, cough, or chest congestion  Follow up with Dr. Melvyn Novas in 2 weeks

## 2015-04-27 ENCOUNTER — Telehealth: Payer: Self-pay | Admitting: Internal Medicine

## 2015-04-27 NOTE — Telephone Encounter (Signed)
Pt reports possible side effect from her albuterol neb Pt having trembling in hands x 2 days and increased weak feeling. Pt states that she only noticed these symptoms after using the nebulizer med.  Please advise Dr Melvyn Novas. Thanks.  Allergies  Allergen Reactions  . Ciprofloxacin Other (See Comments)    Can not take if patient has myastenia gravis  . Doxycycline Hyclate Other (See Comments)    Patient preference  . Morphine And Related Nausea And Vomiting    Mad her extremely sick

## 2015-04-27 NOTE — Telephone Encounter (Signed)
Try use just half dose q 6h prn

## 2015-04-27 NOTE — Telephone Encounter (Signed)
Patient notified.  She will try 1/2 dose and let us know if having any problems Nothing further needed.

## 2015-05-09 ENCOUNTER — Ambulatory Visit (INDEPENDENT_AMBULATORY_CARE_PROVIDER_SITE_OTHER): Payer: Medicare Other | Admitting: Internal Medicine

## 2015-05-09 ENCOUNTER — Encounter: Payer: Self-pay | Admitting: Internal Medicine

## 2015-05-09 VITALS — BP 102/60 | HR 70 | Ht 61.0 in | Wt 130.0 lb

## 2015-05-09 DIAGNOSIS — R058 Other specified cough: Secondary | ICD-10-CM

## 2015-05-09 DIAGNOSIS — R05 Cough: Secondary | ICD-10-CM

## 2015-05-09 LAB — PULMONARY FUNCTION TEST
DL/VA % pred: 96 %
DL/VA: 4.26 ml/min/mmHg/L
DLCO UNC: 14.76 ml/min/mmHg
DLCO unc % pred: 73 %
FEF 25-75 Post: 1.3 L/sec
FEF 25-75 Pre: 0.99 L/sec
FEF2575-%CHANGE-POST: 31 %
FEF2575-%PRED-POST: 149 %
FEF2575-%Pred-Pre: 113 %
FEV1-%Change-Post: 6 %
FEV1-%Pred-Post: 101 %
FEV1-%Pred-Pre: 95 %
FEV1-Post: 1.43 L
FEV1-Pre: 1.35 L
FEV1FVC-%Change-Post: 7 %
FEV1FVC-%PRED-PRE: 103 %
FEV6-%Change-Post: -1 %
FEV6-%Pred-Post: 99 %
FEV6-%Pred-Pre: 100 %
FEV6-Post: 1.78 L
FEV6-Pre: 1.81 L
FEV6FVC-%PRED-POST: 107 %
FEV6FVC-%Pred-Pre: 107 %
FVC-%Change-Post: -1 %
FVC-%PRED-PRE: 93 %
FVC-%Pred-Post: 92 %
FVC-PRE: 1.81 L
FVC-Post: 1.78 L
POST FEV1/FVC RATIO: 80 %
Post FEV6/FVC ratio: 100 %
Pre FEV1/FVC ratio: 75 %
Pre FEV6/FVC Ratio: 100 %
RV % pred: 66 %
RV: 1.58 L
TLC % pred: 76 %
TLC: 3.52 L

## 2015-05-09 MED ORDER — FLUTTER DEVI
Status: DC
Start: 2015-05-09 — End: 2015-09-20

## 2015-05-09 MED ORDER — BUDESONIDE 0.25 MG/2ML IN SUSP: RESPIRATORY_TRACT | Status: DC

## 2015-05-09 NOTE — Assessment & Plan Note (Signed)
-   allergy profile 03/15/15 >  Eos 1.0/  Ige 30 neg RAST - sinus CT 03/10/2015 > No sinusitis. 03/28/2015 med calendar  - 05/09/2015 pfts wnl   The elevated peripheral eos and resp to prednisone strongly favor eos bronchitis > cough variant asthma > eos rhinitis with pnds   Since already on singulair rec rechallenge with pred x 6 days only and go ahead and start bu 0.25 mg bid  I had an extended discussion with the patient reviewing all relevant studies completed to date and  lasting 15 to 20 minutes of a 25 minute visit      Each maintenance medication was reviewed in detail including most importantly the difference between maintenance and as needed and under what circumstances the prns are to be used. This was done in the context of a medication calendar review which provided the patient with a user-friendly unambiguous mechanism for medication administration and reconciliation and provides an action plan for all active problems. It is critical that this be shown to every doctor  for modification during the office visit if necessary so the patient can use it as a working document.

## 2015-05-09 NOTE — Progress Notes (Signed)
PFT done today. Katie Welchel,CMA  

## 2015-05-09 NOTE — Patient Instructions (Addendum)
Prednisone 10 mg take  4 each am x 2 days,   2 each am x 2 days,  1 each am x 2 days and stop   Budesonide 0.25mg  one twice daily per nebuilizer   GERD (REFLUX)  is an extremely common cause of respiratory symptoms just like yours , many times with no obvious heartburn at all.    It can be treated with medication, but also with lifestyle changes including elevation of the head of your bed (ideally with 6 inch  bed blocks),  Smoking cessation, avoidance of late meals, excessive alcohol, and avoid fatty foods, chocolate, peppermint, colas, red wine, and acidic juices such as orange juice.  NO MINT OR MENTHOL PRODUCTS SO NO COUGH DROPS  USE SUGARLESS CANDY INSTEAD (Jolley ranchers or Stover's or Life Savers) or even ice chips will also do - the key is to swallow to prevent all throat clearing. NO OIL BASED VITAMINS - use powdered substitutes.  Whenever you feel urge, cough into the flutter valve.     Please schedule a follow up office visit in 4 weeks, sooner if needed

## 2015-05-09 NOTE — Progress Notes (Signed)
Subjective:    Patient ID: Elaine Owens, female    DOB: 1926-08-02     MRN: [id number]    Brief patient profile:  89 yowf never smoker used to garden had to stop due to arthritis and not very active since her mid 107s and sinus problems spring and fall  with new cough Oct 2015 waxed and waned since then and then referred to pulmonary clinic 03/02/2015 by Ursula Alert    History of Present Illness  03/02/2015 1st [facility] office visit/ Korin Hartwell   Chief Complaint  Patient presents with  . Pulmonary Consult    Referred by Dr. Glo Herring. Pt c/o SOB and cough for the past 8 months- symptoms occur mainly at night but sometimes gets SOB during the day with exertion. She was SOB walking from parking lot to building today. Her cough is prod with minimal yellow sputum.    cough came on gradually in Oct 2015 s assoc abrupt illness/ uri, and persistent daily since:  worse when lie down at bedtime has to sit up 30 degrees - breathing much better when not coughing/ no better with inhalers but not sure she ever got any in her effectively per daughter.  Assoc with sensation of sorethroat/ something stuck in throat but can't cough it out. Some dysphagia but no frank asp to date - does have intermittent HB takes prn ppi  ? Some better temporarily p zpak/ pred.  rec Stop fish oil and fosfamax Try prilosec 20mg   Take x 2= 40 mg  x  30-60 min before first meal of the day and Pepcid (famotidine) 20 mg one bedtime until return Prednisone 10 mg take  4 each am x 2 days,   2 each am x 2 days,  1 each am x 2 days and stop  GERD diet       03/28/2015 Follow up and med review : Cough Returns for 1 month follow up .  Seen last ov for cough and dyspnea.  Has had cough for 9 months.  Has tried delsym , mucinex, robitussium without any help.  Felt better on prednisone but cough return when stopped prednisone .   tx w/ pred taper . Fosamax held.  CT sinus done which was normal.  Cough comes and  goes. No known trigger.  No sign change in cough.  Denies fever, chest pain, orthopnea, or edema.  We reviewed all her meds and organized them into a med calendar with pt education  Appears to be taking correctly.  Has MG on Mestinon  Rec Follow med calendar closely and bring to each visit  Use Delsym 2 tsp Twice daily for cough  May use Hydromet 1/2-1 tsp at bedtime as needed , may make you sleepy.    04/22/15 [facility] acute eval for cough  rec Avoid forcing cough or clearing your throat Sip water when you have the urge to cough Use sugarless candy to keep your mouth moist Use salt water gargles once or twice per day Use nasal irrigation (saline sinus rinse) daily for at least two weeks, then as needed Use flonase two sprays in each nostril daily  Albuterol nebulizer every 4 to 6 hours as needed for wheezing, cough, or chest congestion     05/09/2015 f/u ov/Alexi Dorminey re: cough since 06/2014/ only thing that helped was prednisone  Chief Complaint  Patient presents with  . Follow-up    Pt here to discuss PFT results. Pt states productive cough with yellow mucus  and SOB with exertion during visit today   no better with neb saba/ just made her shaky. Cough is variable day and noct/ sob is just with exertion Has not been able to use hfa previously  No obvious day to day or daytime variability or assoc  cp or chest tightness, subjective wheeze or overt sinus or hb symptoms. No unusual exp hx or h/o childhood pna/ asthma or knowledge of premature birth.  Sleeping ok without nocturnal  or early am exacerbation  of respiratory  c/o's or need for noct saba. Also denies any obvious fluctuation of symptoms with weather or environmental changes or other aggravating or alleviating factors except as outlined above   Current Medications, Allergies, Complete Past Medical History, Past Surgical History, Family History, and Social History were reviewed in [employer] record.  ROS  The  following are not active complaints unless bolded sore throat, dysphagia, dental problems, itching, sneezing,  nasal congestion or excess/ purulent secretions, ear ache,   fever, chills, sweats, unintended wt loss, classically pleuritic or exertional cp, hemoptysis,  orthopnea pnd or leg swelling, presyncope, palpitations, abdominal pain, anorexia, nausea, vomiting, diarrhea  or change in bowel or bladder habits, change in stools or urine, dysuria,hematuria,  rash, arthralgias, visual complaints, headache, numbness, weakness or ataxia or problems with walking or coordination,  change in mood/affect or memory.                 Objective:   Physical Exam   Amb wf nad  Wt Readings from Last 3 Encounters:  05/09/15 130 lb (58.968 kg)  04/22/15 133 lb (60.328 kg)  03/28/15 134 lb (60.782 kg)    Vital signs reviewed    HEENT:  Full dentures/ nl  turbinates, and orophanx. Nl external ear canals without cough reflex   NECK :  without JVD/Nodes/TM/ nl carotid upstrokes bilaterally - no stridor   LUNGS: no acc muscle use,  Mid/late exp rhonchi bilaterally assoc with urge to cough    CV:  RRR  no s3 or murmur or increase in P2, no edema   ABD:  soft and nontender with nl excursion in the supine position. No bruits or organomegaly, bowel sounds nl  MS:  warm without deformities, calf tenderness, cyanosis or clubbing  SKIN: warm and dry without lesions    NEURO:  alert, approp, no deficits            Assessment & Plan:

## 2015-05-10 ENCOUNTER — Telehealth: Payer: Self-pay | Admitting: Internal Medicine

## 2015-05-10 MED ORDER — PREDNISONE 10 MG PO TABS: ORAL_TABLET | ORAL | Status: DC

## 2015-05-10 NOTE — Telephone Encounter (Signed)
Received call from [facility]. Patient advised them that Dr. Melvyn Novas was to call in Prednisone Per instructions on OV, sent Rx to pharmacy. Nothing further needed.

## 2015-05-11 ENCOUNTER — Telehealth: Payer: Self-pay | Admitting: Internal Medicine

## 2015-05-11 MED ORDER — BUDESONIDE 0.25 MG/2ML IN SUSP: RESPIRATORY_TRACT | Status: DC

## 2015-05-11 NOTE — Telephone Encounter (Signed)
Spoke with [name] She states that she is needing [place] to send in rx for budesonide to [facility]  Rx was sent and nothing further needed

## 2015-05-16 ENCOUNTER — Telehealth: Payer: Self-pay | Admitting: Internal Medicine

## 2015-05-16 NOTE — Telephone Encounter (Signed)
Called spoke with pt. She wanted to confirm she was using the flutter valve correctly that she just coughs into this. I advised her this was correct. Nothing further needed

## 2015-05-19 ENCOUNTER — Other Ambulatory Visit: Payer: Self-pay | Admitting: Physician Assistant

## 2015-05-19 DIAGNOSIS — R5381 Other malaise: Secondary | ICD-10-CM

## 2015-05-30 ENCOUNTER — Telehealth: Payer: Self-pay | Admitting: Internal Medicine

## 2015-05-30 NOTE — Telephone Encounter (Signed)
Not in the long term - albuterol will just give her temporary relief  - budesonide  should be 80% paid for under medicar part B (not D) and if still can't afford it then ov with all meds in 2 weeks an drug formulary and we'll regroup then

## 2015-05-30 NOTE — Telephone Encounter (Signed)
Patient has appointment with Dr. Melvyn Novas on 9/19 and will bring all of her medications with her at that [place] to "regroup" and see if there is a cheaper alternative.  Nothing further needed.

## 2015-05-30 NOTE — Telephone Encounter (Signed)
Called spoke w/ pt. She reports the budesonide is too expensive and wants to know if she can just use the albuterol neb instead. Please advise thanks

## 2015-06-06 ENCOUNTER — Encounter: Payer: Self-pay | Admitting: Internal Medicine

## 2015-06-06 ENCOUNTER — Ambulatory Visit (INDEPENDENT_AMBULATORY_CARE_PROVIDER_SITE_OTHER): Payer: Medicare Other | Admitting: Internal Medicine

## 2015-06-06 VITALS — BP 110/62 | HR 69 | Ht 61.0 in | Wt 129.0 lb

## 2015-06-06 DIAGNOSIS — R05 Cough: Secondary | ICD-10-CM | POA: Diagnosis not present

## 2015-06-06 DIAGNOSIS — Z23 Encounter for immunization: Secondary | ICD-10-CM | POA: Diagnosis not present

## 2015-06-06 DIAGNOSIS — R058 Other specified cough: Secondary | ICD-10-CM

## 2015-06-06 NOTE — Progress Notes (Signed)
Subjective:    Patient ID: Ma Hillock, female    DOB: October 21, 1925     MRN: [id number]    Brief patient profile:  78 yowf never smoker used to garden had to stop due to arthritis and not very active since her mid 75s and sinus problems spring and fall  with new cough Oct 2015 waxed and waned since then and then referred to pulmonary clinic 03/02/2015 by Ursula Alert    History of Present Illness  03/02/2015 1st [facility] office visit/ Wert   Chief Complaint  Patient presents with  . Pulmonary Consult    Referred by Dr. Glo Herring. Pt c/o SOB and cough for the past 8 months- symptoms occur mainly at night but sometimes gets SOB during the day with exertion. She was SOB walking from parking lot to building today. Her cough is prod with minimal yellow sputum.    cough came on gradually in Oct 2015 s assoc abrupt illness/ uri, and persistent daily since:  worse when lie down at bedtime has to sit up 30 degrees - breathing much better when not coughing/ no better with inhalers but not sure she ever got any in her effectively per daughter.  Assoc with sensation of sorethroat/ something stuck in throat but can't cough it out. Some dysphagia but no frank asp to date - does have intermittent HB takes prn ppi  ? Some better temporarily p zpak/ pred.  rec Stop fish oil and fosfamax Try prilosec 20mg   Take x 2= 40 mg  x  30-60 min before first meal of the day and Pepcid (famotidine) 20 mg one bedtime until return Prednisone 10 mg take  4 each am x 2 days,   2 each am x 2 days,  1 each am x 2 days and stop  GERD diet       03/28/2015 Follow up and med review : Cough Returns for 1 month follow up .  Seen last ov for cough and dyspnea.  Has had cough for 9 months.  Has tried delsym , mucinex, robitussium without any help.  Felt better on prednisone but cough return when stopped prednisone .   tx w/ pred taper . Fosamax held.  CT sinus done which was normal.  Cough comes and  goes. No known trigger.  No sign change in cough.  Denies fever, chest pain, orthopnea, or edema.  We reviewed all her meds and organized them into a med calendar with pt education  Appears to be taking correctly.  Has MG on Mestinon  Rec Follow med calendar closely and bring to each visit  Use Delsym 2 tsp Twice daily for cough  May use Hydromet 1/2-1 tsp at bedtime as needed , may make you sleepy.     04/22/15 [facility]ood acute eval for cough  rec Avoid forcing cough or clearing your throat Sip water when you have the urge to cough Use sugarless candy to keep your mouth moist Use salt water gargles once or twice per day Use nasal irrigation (saline sinus rinse) daily for at least two weeks, then as needed Use flonase two sprays in each nostril daily  Albuterol nebulizer every 4 to 6 hours as needed for wheezing, cough, or chest congestion     05/09/2015 f/u ov/Wert re: cough since 06/2014/ only thing that helped was prednisone  Chief Complaint  Patient presents with  . Follow-up    Pt here to discuss PFT results. Pt states productive cough with yellow  mucus and SOB with exertion during visit today   no better with neb saba/ just made her shaky. Cough is variable day and noct/ sob is just with exertion Has not been able to use hfa previously rec Prednisone 10 mg take  4 each am x 2 days,   2 each am x 2 days,  1 each am x 2 days and stop  Budesonide 0.25mg  one twice daily per nebuilizer  GERD Whenever you feel urge, cough into the flutter valve.     06/06/2015 f/u ov/Wert re: prob cough variant asthma/steroid responsive Chief Complaint  Patient presents with  . Follow-up    Pt states that her cough is much improved. No new co's today.       Not limited by breathing from desired activities  / having trouble remembering to keep the flutter valve handy to cough into.  No obvious day to day or daytime variability or assoc excess/ purulent sputum  cp or chest tightness, subjective  wheeze or overt sinus or hb symptoms. No unusual exp hx or h/o childhood pna/ asthma or knowledge of premature birth.  Sleeping ok without nocturnal  or early am exacerbation  of respiratory  c/o's or need for noct saba. Also denies any obvious fluctuation of symptoms with weather or environmental changes or other aggravating or alleviating factors except as outlined above   Current Medications, Allergies, Complete Past Medical History, Past Surgical History, Family History, and Social History were reviewed in [employer] record.  ROS  The following are not active complaints unless bolded sore throat, dysphagia, dental problems, itching, sneezing,  nasal congestion or excess/ purulent secretions, ear ache,   fever, chills, sweats, unintended wt loss, classically pleuritic or exertional cp, hemoptysis,  orthopnea pnd or leg swelling, presyncope, palpitations, abdominal pain, anorexia, nausea, vomiting, diarrhea  or change in bowel or bladder habits, change in stools or urine, dysuria,hematuria,  rash, arthralgias, visual complaints, headache, numbness, weakness or ataxia or problems with walking or coordination,  change in mood/affect or memory.            Objective:   Physical Exam   Amb somewhat frail wf nad/ one person assist to get on exam table    06/06/2015        129  Wt Readings from Last 3 Encounters:  05/09/15 130 lb (58.968 kg)  04/22/15 133 lb (60.328 kg)  03/28/15 134 lb (60.782 kg)    Vital signs reviewed    HEENT:  Full dentures/ nl  turbinates, and orophanx. Nl external ear canals without cough reflex   NECK :  without JVD/Nodes/TM/ nl carotid upstrokes bilaterally - no stridor   LUNGS: no acc muscle use,       CV:  RRR  no s3 or murmur or increase in P2, no edema   ABD:  soft and nontender with nl excursion in the supine position. No bruits or organomegaly, bowel sounds nl  MS:  warm without deformities, calf tenderness, cyanosis or  clubbing  SKIN: warm and dry without lesions    NEURO:  alert, approp, no deficits              Assessment & Plan:

## 2015-06-06 NOTE — Patient Instructions (Signed)
No change in recommendations until 2 more months then reduce the nebulizer  one pulmocort daily  GERD (REFLUX)  is an extremely common cause of respiratory symptoms just like yours , many times with no obvious heartburn at all.    It can be treated with medication, but also with lifestyle changes including elevation of the head of your bed (ideally with 6 inch  bed blocks),  Smoking cessation, avoidance of late meals, excessive alcohol, and avoid fatty foods, chocolate, peppermint, colas, red wine, and acidic juices such as orange juice.  NO MINT OR MENTHOL PRODUCTS SO NO COUGH DROPS  USE SUGARLESS CANDY INSTEAD (Jolley ranchers or Stover's or Life Savers) or even ice chips will also do - the key is to swallow to prevent all throat clearing. NO OIL BASED VITAMINS - use powdered substitutes.    Please schedule a follow up visit in 3 months but call sooner if needed

## 2015-06-07 NOTE — Assessment & Plan Note (Addendum)
-   allergy profile 03/15/15 >  Eos 1.0/  Ige 30 neg RAST - sinus CT 03/10/2015 > No sinusitis. 03/28/2015 med calendar  - 05/09/2015 pfts wnl  - 05/09/2015 added flutter valve/ budesonide bid > marked improvement at 06/06/2015 ov   Very impressive response previously to pred and now better on ICS strongly suggests cough variant asthma, at least a component.   Since many coughs are cyclical/ multifactorial rec 2 more months of budesonide 0.25 mg bid then reduce to daily  .I had an extended discussion with the patient reviewing all relevant studies completed to date and  lasting 15 to 20 minutes of a 25 minute visit    Each maintenance medication was reviewed in detail including most importantly the difference between maintenance and prns and under what circumstances the prns are to be triggered using an action plan format that is not reflected in the computer generated alphabetically organized AVS.    Please see instructions for details which were reviewed in writing and the patient given a copy highlighting the part that I personally wrote and discussed at today's ov.

## 2015-07-20 ENCOUNTER — Telehealth: Payer: Self-pay | Admitting: Internal Medicine

## 2015-07-20 NOTE — Telephone Encounter (Signed)
We received letter from the pt stating that her cough is much improved, but the Budesonide is making her throat "raw"  Dr Melvyn Novas recommends taking med only once per day for the next 2 wks, then try stopping  I advised her of these recs and she verbalized understanding  She will call with any questions/concerns

## 2015-07-29 ENCOUNTER — Telehealth: Payer: Self-pay | Admitting: Internal Medicine

## 2015-07-29 NOTE — Telephone Encounter (Signed)
Patient notified of Dr. Gustavus Bryant recommendations and precautions. Nothing further needed. Closing encounter

## 2015-07-29 NOTE — Telephone Encounter (Signed)
Received letter from pt wanting to start back on alendronate if possible - since she's clearly better now let her know that's fine but don't change anything else for 3 months and see over the next 3 months if cough is worse and if it is we do have available an IV form of the drug that's used yearly instead of weelkly  and bypasses the stomach/gerd problem so let us know and we can re-address the issue then if needed or she can discuss options with her primary care doctor

## 2015-09-05 ENCOUNTER — Ambulatory Visit: Payer: Medicare Other | Admitting: Internal Medicine

## 2015-09-20 ENCOUNTER — Emergency Department (HOSPITAL_BASED_OUTPATIENT_CLINIC_OR_DEPARTMENT_OTHER)
Admission: EM | Admit: 2015-09-20 | Discharge: 2015-09-20 | Disposition: A | Discharge status: Home / Self Care | Payer: Medicare Other | Attending: Emergency Medicine | Admitting: Emergency Medicine

## 2015-09-20 ENCOUNTER — Encounter (HOSPITAL_BASED_OUTPATIENT_CLINIC_OR_DEPARTMENT_OTHER): Payer: Self-pay | Admitting: *Deleted

## 2015-09-20 DIAGNOSIS — Z853 Personal history of malignant neoplasm of breast: Secondary | ICD-10-CM | POA: Insufficient documentation

## 2015-09-20 DIAGNOSIS — M5441 Lumbago with sciatica, right side: Secondary | ICD-10-CM | POA: Insufficient documentation

## 2015-09-20 DIAGNOSIS — Z79899 Other long term (current) drug therapy: Secondary | ICD-10-CM | POA: Diagnosis not present

## 2015-09-20 DIAGNOSIS — Z7982 Long term (current) use of aspirin: Secondary | ICD-10-CM | POA: Diagnosis not present

## 2015-09-20 DIAGNOSIS — G7000 Myasthenia gravis without (acute) exacerbation: Secondary | ICD-10-CM | POA: Insufficient documentation

## 2015-09-20 DIAGNOSIS — Z862 Personal history of diseases of the blood and blood-forming organs and certain disorders involving the immune mechanism: Secondary | ICD-10-CM | POA: Diagnosis not present

## 2015-09-20 DIAGNOSIS — I10 Essential (primary) hypertension: Secondary | ICD-10-CM | POA: Diagnosis not present

## 2015-09-20 DIAGNOSIS — Z7951 Long term (current) use of inhaled steroids: Secondary | ICD-10-CM | POA: Insufficient documentation

## 2015-09-20 DIAGNOSIS — M1990 Unspecified osteoarthritis, unspecified site: Secondary | ICD-10-CM | POA: Diagnosis not present

## 2015-09-20 DIAGNOSIS — M545 Low back pain: Secondary | ICD-10-CM | POA: Diagnosis present

## 2015-09-20 MED ORDER — ONDANSETRON 4 MG PO TBDP
4.0000 mg | ORAL_TABLET | Freq: Once | ORAL | Status: AC
  Administered 2015-09-20: 4 mg via ORAL
  Filled 2015-09-20: qty 1

## 2015-09-20 MED ORDER — ONDANSETRON 4 MG PO TBDP: 4.0000 mg | ORAL_TABLET | Freq: Three times a day (TID) | ORAL | Status: DC | PRN

## 2015-09-20 MED ORDER — DEXAMETHASONE 4 MG PO TABS
10.0000 mg | ORAL_TABLET | Freq: Once | ORAL | Status: AC
  Administered 2015-09-20: 10 mg via ORAL
  Filled 2015-09-20: qty 1

## 2015-09-20 MED ORDER — KETOROLAC TROMETHAMINE 60 MG/2ML IM SOLN
30.0000 mg | Freq: Once | INTRAMUSCULAR | Status: AC
  Administered 2015-09-20: 30 mg via INTRAMUSCULAR
  Filled 2015-09-20: qty 2

## 2015-09-20 MED ORDER — HYDROMORPHONE HCL 1 MG/ML IJ SOLN
0.5000 mg | Freq: Once | INTRAMUSCULAR | Status: AC
  Administered 2015-09-20: 0.5 mg via INTRAMUSCULAR
  Filled 2015-09-20: qty 1

## 2015-09-20 MED ORDER — OXYCODONE-ACETAMINOPHEN 5-325 MG PO TABS: 1.0000 | ORAL_TABLET | ORAL | Status: DC | PRN

## 2015-09-20 MED FILL — ONDANSETRON ODT 4 MG TABLET: 4 | 4 days supply | Qty: 12 | Fill #0

## 2015-09-20 MED FILL — OXYCODONE/APAP 5-325: 5-325 | 1 days supply | Qty: 5 | Fill #0

## 2015-09-20 NOTE — ED Provider Notes (Signed)
CSN: [id number]     Arrival date & time 09/20/15  0844 History   First MD Initiated Contact with Patient 09/20/15 0920     Chief Complaint  Patient presents with  . Back Pain     (Consider location/radiation/quality/duration/timing/severity/associated sxs/prior Treatment) Patient is a 82 y.o. female presenting with back pain.  Back Pain Location:  Lumbar spine Quality: sharp. Radiates to:  R foot and R posterior upper leg Pain severity:  Severe Onset quality:  Sudden Duration: started after fall 11/14, xr completed MR with Orthopedic physician Percell Miller  Timing:  Constant Progression:  Worsening Chronicity:  New Context: falling   Exacerbated by: reclining, moving. Ineffective treatments:  NSAIDs, ibuprofen, heating pad and cold packs (tylenol, hydrocodone made her nauseas) Associated symptoms: no abdominal pain, no bladder incontinence, no bowel incontinence, no chest pain, no dysuria, no fever, no headaches, no numbness, no perianal numbness, no tingling and no weakness     Past Medical History  Diagnosis Date  . Arthritis   . Hypertension   . Immune deficiency disorder ([place])   . Myasthenia gravis ([place])   . lt breast ca dx'd 1978 or 1988 pt unsure    lt masectomy; tamoxifen completed  . Complication of anesthesia     hard to wake up   Past Surgical History  Procedure Laterality Date  . Masectomny Left     1978  . Breast surgery    . Appendectomy    . Eye surgery      lens implant  . Mastectomy    . Abdominal hysterectomy    . Hemorrhoid surgery     Family History  Problem Relation Age of Onset  . Colon cancer Father   . Liver cancer Mother   . Asthma Father    Social History  Substance Use Topics  . Smoking status: Never Smoker   . Smokeless tobacco: Never Used  . Alcohol Use: No   OB History    No data available     Review of Systems  Constitutional: Negative for fever.  HENT: Negative for sore throat.   Eyes: Negative for visual disturbance.   Respiratory: Negative for cough and shortness of breath.   Cardiovascular: Negative for chest pain.  Gastrointestinal: Negative for abdominal pain and bowel incontinence.  Genitourinary: Negative for bladder incontinence, dysuria and difficulty urinating.  Musculoskeletal: Positive for back pain. Negative for neck pain.  Skin: Negative for rash.  Neurological: Negative for tingling, syncope, weakness, numbness and headaches.      Allergies  Ciprofloxacin; Doxycycline hyclate; and Morphine and related  Home Medications   Prior to Admission medications   Medication Sig Start Date End Date Taking? Authorizing Provider  ALPRAZolam Duanne Moron) 0.25 MG tablet Take 1.5mg  at bedtime as needed for anxiety    Historical Provider, MD  amLODipine (NORVASC) 5 MG tablet Take 5 mg by mouth every morning.     Historical Provider, MD  aspirin EC 81 MG tablet Take 81 mg by mouth daily.    Historical Provider, MD  Cholecalciferol (VITAMIN D) 2000 UNITS CAPS Take 2,000 Units by mouth daily.    Historical Provider, MD  diphenhydramine-acetaminophen (TYLENOL PM) 25-500 MG TABS Take 1 tablet by mouth at bedtime as needed (for insomnia).    Historical Provider, MD  donepezil (ARICEPT) 10 MG tablet Take 10 mg by mouth 2 (two) times daily.     Historical Provider, MD  famotidine (PEPCID) 20 MG tablet Take 20 mg by mouth every evening.    Historical  Provider, MD  fluticasone (FLONASE) 50 MCG/ACT nasal spray Place 2 sprays into both nostrils daily. 04/22/15   Chesley Mires, MD  latanoprost (XALATAN) 0.005 % ophthalmic solution Place 1 drop into both eyes at bedtime.    Historical Provider, MD  ondansetron (ZOFRAN ODT) 4 MG disintegrating tablet Take 1 tablet (4 mg total) by mouth every 8 (eight) hours as needed for nausea or vomiting. 09/20/15   Gareth Morgan, MD  oxybutynin (DITROPAN) 5 MG tablet Take 5 mg by mouth 3 (three) times daily.     Historical Provider, MD  oxyCODONE-acetaminophen (PERCOCET/ROXICET) 5-325 MG  tablet Take 1 tablet by mouth every 4 (four) hours as needed for severe pain. 09/20/15   Gareth Morgan, MD  pyridostigmine (MESTINON) 60 MG tablet Take 1.5 tablets by mouth twice a day    Historical Provider, MD  simvastatin (ZOCOR) 40 MG tablet Take 40 mg by mouth every evening.    Historical Provider, MD   BP 146/67 mmHg  Pulse 62  Temp(Src) 98.1 F (36.7 C) (Oral)  Resp 16  Ht 5\' 1"  (1.549 m)  Wt 120 lb (54.432 kg)  BMI 22.69 kg/m2  SpO2 97% Physical Exam  Constitutional: She is oriented to person, place, and time. She appears well-developed and well-nourished. No distress.  HENT:  Head: Normocephalic and atraumatic.  Eyes: Conjunctivae and EOM are normal.  Neck: Normal range of motion.  Cardiovascular: Normal rate, regular rhythm, normal heart sounds and intact distal pulses.  Exam reveals no gallop and no friction rub.   No murmur heard. Pulmonary/Chest: Effort normal and breath sounds normal. No respiratory distress. She has no wheezes. She has no rales.  Abdominal: Soft. She exhibits no distension. There is no tenderness. There is no guarding.  Musculoskeletal: She exhibits no edema.       Lumbar back: She exhibits tenderness and bony tenderness.  Right lower back, right iliac crest, midline sacral pain   Neurological: She is alert and oriented to person, place, and time. She has normal strength. No cranial nerve deficit or sensory deficit. GCS eye subscore is 4. GCS verbal subscore is 5. GCS motor subscore is 6.  Skin: Skin is warm and dry. No rash noted. She is not diaphoretic. No erythema.  Nursing note and vitals reviewed.   ED Course  Procedures (including critical care time) Labs Review Labs Reviewed - No data to display  Imaging Review No results found. I have personally reviewed and evaluated these images and lab results as part of my medical decision-making.   EKG Interpretation None      MDM   Final diagnoses:  Right-sided low back pain with  right-sided sciatica   82 year old female with a history of hypertension, myasthenia gravis, remote left-sided breast cancer in remission, presents with concern for lumbar back pain. Patient reports she fell November 14, and has had back pain ever since that time. She was evaluated by by with lumbar x-rays as well as MRI performed at her orthopedic physician's office. Patient has a normal neurologic exam and denies any urinary retention or overflow incontinence, stool incontinence, saddle anesthesia, fever, IV drug use, new trauma, chronic steroid use or immunocompromise and have low suspicion suspicion for cauda equina, fracture, epidural abscess, or vertebral osteomyelitis.  She has had MRI since symptoms began.  Issue appears to be pain control, with pt only taking tylenol/ibuprofen at this time.  She is scheduled to have injection with Orthopedics tomorrow but could not wait. Given dilaudid in ED with improvement and  small rx for percocet with zofran for nausea.  Patient discharged in stable condition with understanding of reasons to return.   Gareth Morgan, MD 09/20/15 [phone]

## 2015-09-20 NOTE — ED Notes (Signed)
Pt d/c home with family to drive- directed to pharmacy to pick up medications

## 2015-09-20 NOTE — ED Notes (Signed)
Patient preparing for discharge. 

## 2015-09-20 NOTE — ED Notes (Signed)
Pt to room 10 in w/c, able to stand and pivot to bed. Pt reports fall on 11/14, was dx with ruptured disc and "pinched nerves". Pt states she has good and bad days, but today and yesterday her pain has been "unbearable". Pt awoke her daughter at 7am in severe pain, so she brought her here.

## 2015-09-20 NOTE — Discharge Instructions (Signed)
Back Exercises The following exercises strengthen the muscles that help to support the back. They also help to keep the lower back flexible. Doing these exercises can help to prevent back pain or lessen existing pain. If you have back pain or discomfort, try doing these exercises 2-3 times each day or as told by your health care provider. When the pain goes away, do them once each day, but increase the number of times that you repeat the steps for each exercise (do more repetitions). If you do not have back pain or discomfort, do these exercises once each day or as told by your health care provider. EXERCISES Single Knee to Chest Repeat these steps 3-5 times for each leg:  Lie on your back on a firm bed or the floor with your legs extended.  Bring one knee to your chest. Your other leg should stay extended and in contact with the floor.  Hold your knee in place by grabbing your knee or thigh.  Pull on your knee until you feel a gentle stretch in your lower back.  Hold the stretch for 10-30 seconds.  Slowly release and straighten your leg. Pelvic Tilt Repeat these steps 5-10 times:  Lie on your back on a firm bed or the floor with your legs extended.  Bend your knees so they are pointing toward the ceiling and your feet are flat on the floor.  Tighten your lower abdominal muscles to press your lower back against the floor. This motion will tilt your pelvis so your tailbone points up toward the ceiling instead of pointing to your feet or the floor.  With gentle tension and even breathing, hold this position for 5-10 seconds. Cat-Cow Repeat these steps until your lower back becomes more flexible:  Get into a hands-and-knees position on a firm surface. Keep your hands under your shoulders, and keep your knees under your hips. You may place padding under your knees for comfort.  Let your head hang down, and point your tailbone toward the floor so your lower back becomes rounded like the  back of a cat.  Hold this position for 5 seconds.  Slowly lift your head and point your tailbone up toward the ceiling so your back forms a sagging arch like the back of a cow.  Hold this position for 5 seconds. Press-Ups Repeat these steps 5-10 times: 1. Lie on your abdomen (face-down) on the floor. 2. Place your palms near your head, about shoulder-width apart. 3. While you keep your back as relaxed as possible and keep your hips on the floor, slowly straighten your arms to raise the top half of your body and lift your shoulders. Do not use your back muscles to raise your upper torso. You may adjust the placement of your hands to make yourself more comfortable. 4. Hold this position for 5 seconds while you keep your back relaxed. 5. Slowly return to lying flat on the floor. Bridges Repeat these steps 10 times: 1. Lie on your back on a firm surface. 2. Bend your knees so they are pointing toward the ceiling and your feet are flat on the floor. 3. Tighten your buttocks muscles and lift your buttocks off of the floor until your waist is at almost the same height as your knees. You should feel the muscles working in your buttocks and the back of your thighs. If you do not feel these muscles, slide your feet 1-2 inches farther away from your buttocks. 4. Hold this position for 3-5  seconds. 5. Slowly lower your hips to the starting position, and allow your buttocks muscles to relax completely. If this exercise is too easy, try doing it with your arms crossed over your chest. Abdominal Crunches Repeat these steps 5-10 times: 1. Lie on your back on a firm bed or the floor with your legs extended. 2. Bend your knees so they are pointing toward the ceiling and your feet are flat on the floor. 3. Cross your arms over your chest. 4. Tip your chin slightly toward your chest without bending your neck. 5. Tighten your abdominal muscles and slowly raise your trunk (torso) high enough to lift your  shoulder blades a tiny bit off of the floor. Avoid raising your torso higher than that, because it can put too much stress on your low back and it does not help to strengthen your abdominal muscles. 6. Slowly return to your starting position. Back Lifts Repeat these steps 5-10 times: 1. Lie on your abdomen (face-down) with your arms at your sides, and rest your forehead on the floor. 2. Tighten the muscles in your legs and your buttocks. 3. Slowly lift your chest off of the floor while you keep your hips pressed to the floor. Keep the back of your head in line with the curve in your back. Your eyes should be looking at the floor. 4. Hold this position for 3-5 seconds. 5. Slowly return to your starting position. SEEK MEDICAL CARE IF:  Your back pain or discomfort gets much worse when you do an exercise.  Your back pain or discomfort does not lessen within 2 hours after you exercise. If you have any of these problems, stop doing these exercises right away. Do not do them again unless your health care provider says that you can. SEEK IMMEDIATE MEDICAL CARE IF:  You develop sudden, severe back pain. If this happens, stop doing the exercises right away. Do not do them again unless your health care provider says that you can.   This information is not intended to replace advice given to you by your health care provider. Make sure you discuss any questions you have with your health care provider.   Document Released: 10/11/2004 Document Revised: 05/25/2015 Document Reviewed: 10/28/2014 Elsevier Interactive Patient Education 2016 [employer]  Back Exercises The following exercises strengthen the muscles that help to support the back. They also help to keep the lower back flexible. Doing these exercises can help to prevent back pain or lessen existing pain. If you have back pain or discomfort, try doing these exercises 2-3 times each day or as told by your health care provider. When the pain goes  away, do them once each day, but increase the number of times that you repeat the steps for each exercise (do more repetitions). If you do not have back pain or discomfort, do these exercises once each day or as told by your health care provider. EXERCISES Single Knee to Chest Repeat these steps 3-5 times for each leg:  Lie on your back on a firm bed or the floor with your legs extended.  Bring one knee to your chest. Your other leg should stay extended and in contact with the floor.  Hold your knee in place by grabbing your knee or thigh.  Pull on your knee until you feel a gentle stretch in your lower back.  Hold the stretch for 10-30 seconds.  Slowly release and straighten your leg. Pelvic Tilt Repeat these steps 5-10 times:  Lie on  your back on a firm bed or the floor with your legs extended.  Bend your knees so they are pointing toward the ceiling and your feet are flat on the floor.  Tighten your lower abdominal muscles to press your lower back against the floor. This motion will tilt your pelvis so your tailbone points up toward the ceiling instead of pointing to your feet or the floor.  With gentle tension and even breathing, hold this position for 5-10 seconds. Cat-Cow Repeat these steps until your lower back becomes more flexible:  Get into a hands-and-knees position on a firm surface. Keep your hands under your shoulders, and keep your knees under your hips. You may place padding under your knees for comfort.  Let your head hang down, and point your tailbone toward the floor so your lower back becomes rounded like the back of a cat.  Hold this position for 5 seconds.  Slowly lift your head and point your tailbone up toward the ceiling so your back forms a sagging arch like the back of a cow.  Hold this position for 5 seconds. Press-Ups Repeat these steps 5-10 times: 6. Lie on your abdomen (face-down) on the floor. 7. Place your palms near your head, about  shoulder-width apart. 8. While you keep your back as relaxed as possible and keep your hips on the floor, slowly straighten your arms to raise the top half of your body and lift your shoulders. Do not use your back muscles to raise your upper torso. You may adjust the placement of your hands to make yourself more comfortable. 9. Hold this position for 5 seconds while you keep your back relaxed. 10. Slowly return to lying flat on the floor. Bridges Repeat these steps 10 times: 6. Lie on your back on a firm surface. 7. Bend your knees so they are pointing toward the ceiling and your feet are flat on the floor. 8. Tighten your buttocks muscles and lift your buttocks off of the floor until your waist is at almost the same height as your knees. You should feel the muscles working in your buttocks and the back of your thighs. If you do not feel these muscles, slide your feet 1-2 inches farther away from your buttocks. 9. Hold this position for 3-5 seconds. 10. Slowly lower your hips to the starting position, and allow your buttocks muscles to relax completely. If this exercise is too easy, try doing it with your arms crossed over your chest. Abdominal Crunches Repeat these steps 5-10 times: 7. Lie on your back on a firm bed or the floor with your legs extended. 8. Bend your knees so they are pointing toward the ceiling and your feet are flat on the floor. 9. Cross your arms over your chest. 10. Tip your chin slightly toward your chest without bending your neck. 16. Tighten your abdominal muscles and slowly raise your trunk (torso) high enough to lift your shoulder blades a tiny bit off of the floor. Avoid raising your torso higher than that, because it can put too much stress on your low back and it does not help to strengthen your abdominal muscles. 12. Slowly return to your starting position. Back Lifts Repeat these steps 5-10 times: 6. Lie on your abdomen (face-down) with your arms at your sides,  and rest your forehead on the floor. 7. Tighten the muscles in your legs and your buttocks. 8. Slowly lift your chest off of the floor while you keep your hips pressed to  the floor. Keep the back of your head in line with the curve in your back. Your eyes should be looking at the floor. 9. Hold this position for 3-5 seconds. 10. Slowly return to your starting position. SEEK MEDICAL CARE IF:  Your back pain or discomfort gets much worse when you do an exercise.  Your back pain or discomfort does not lessen within 2 hours after you exercise. If you have any of these problems, stop doing these exercises right away. Do not do them again unless your health care provider says that you can. SEEK IMMEDIATE MEDICAL CARE IF:  You develop sudden, severe back pain. If this happens, stop doing the exercises right away. Do not do them again unless your health care provider says that you can.   This information is not intended to replace advice given to you by your health care provider. Make sure you discuss any questions you have with your health care provider.   Document Released: 10/11/2004 Document Revised: 05/25/2015 Document Reviewed: 10/28/2014 Elsevier Interactive Patient Education 2016 [employer]  Chronic Back Pain  When back pain lasts longer than 3 months, it is called chronic back pain.People with chronic back pain often go through certain periods that are more intense (flare-ups).  CAUSES Chronic back pain can be caused by wear and tear (degeneration) on different structures in your back. These structures include:  The bones of your spine (vertebrae) and the joints surrounding your spinal cord and nerve roots (facets).  The strong, fibrous tissues that connect your vertebrae (ligaments). Degeneration of these structures may result in pressure on your nerves. This can lead to constant pain. HOME CARE INSTRUCTIONS  Avoid bending, heavy lifting, prolonged sitting, and activities which  make the problem worse.  Take brief periods of rest throughout the day to reduce your pain. Lying down or standing usually is better than sitting while you are resting.  Take over-the-counter or prescription medicines only as directed by your caregiver. SEEK IMMEDIATE MEDICAL CARE IF:   You have weakness or numbness in one of your legs or feet.  You have trouble controlling your bladder or bowels.  You have nausea, vomiting, abdominal pain, shortness of breath, or fainting.   This information is not intended to replace advice given to you by your health care provider. Make sure you discuss any questions you have with your health care provider.   Document Released: 10/11/2004 Document Revised: 11/26/2011 Document Reviewed: 02/21/2015 Elsevier Interactive Patient Education [employer].

## 2015-09-20 NOTE — ED Notes (Signed)
MD at bedside. 

## 2015-09-25 ENCOUNTER — Emergency Department (HOSPITAL_BASED_OUTPATIENT_CLINIC_OR_DEPARTMENT_OTHER)
Admission: EM | Admit: 2015-09-25 | Discharge: 2015-09-25 | Discharge status: Against Medical Advice | Payer: Medicare Other | Attending: Emergency Medicine | Admitting: Emergency Medicine

## 2015-09-25 ENCOUNTER — Encounter (HOSPITAL_BASED_OUTPATIENT_CLINIC_OR_DEPARTMENT_OTHER): Payer: Self-pay | Admitting: *Deleted

## 2015-09-25 DIAGNOSIS — K5900 Constipation, unspecified: Secondary | ICD-10-CM | POA: Diagnosis not present

## 2015-09-25 DIAGNOSIS — I10 Essential (primary) hypertension: Secondary | ICD-10-CM | POA: Diagnosis not present

## 2015-09-25 NOTE — ED Notes (Signed)
Seen here in the past week for back pain and started on hydrocodone. States no BM x 1 week

## 2015-09-25 NOTE — ED Notes (Signed)
Called x 3, no answer. Registration staff reports pt exited facility and did not return. Assumed to have LWBS.

## 2015-11-22 ENCOUNTER — Other Ambulatory Visit: Payer: Self-pay | Admitting: Internal Medicine

## 2015-11-22 DIAGNOSIS — R110 Nausea: Secondary | ICD-10-CM

## 2015-11-22 DIAGNOSIS — R109 Unspecified abdominal pain: Secondary | ICD-10-CM

## 2015-11-28 ENCOUNTER — Ambulatory Visit
Admission: RE | Admit: 2015-11-28 | Discharge: 2015-11-28 | Disposition: A | Discharge status: Home / Self Care | Payer: Medicare Other | Source: Ambulatory Visit | Attending: Internal Medicine | Admitting: Internal Medicine

## 2015-11-28 DIAGNOSIS — R109 Unspecified abdominal pain: Secondary | ICD-10-CM

## 2015-11-28 DIAGNOSIS — R110 Nausea: Secondary | ICD-10-CM

## 2015-11-30 ENCOUNTER — Other Ambulatory Visit: Payer: Self-pay | Admitting: Internal Medicine

## 2015-11-30 DIAGNOSIS — R188 Other ascites: Secondary | ICD-10-CM

## 2015-12-08 ENCOUNTER — Ambulatory Visit
Admission: RE | Admit: 2015-12-08 | Discharge: 2015-12-08 | Disposition: A | Discharge status: Home / Self Care | Payer: Medicare Other | Source: Ambulatory Visit | Attending: Internal Medicine | Admitting: Internal Medicine

## 2015-12-08 DIAGNOSIS — R188 Other ascites: Secondary | ICD-10-CM

## 2015-12-08 MED ORDER — GADOBENATE DIMEGLUMINE 529 MG/ML IV SOLN
10.0000 mL | Freq: Once | INTRAVENOUS | Status: AC | PRN
  Administered 2015-12-08: 10 mL via INTRAVENOUS

## 2016-02-14 ENCOUNTER — Other Ambulatory Visit: Payer: Self-pay

## 2016-02-14 DIAGNOSIS — Z1231 Encounter for screening mammogram for malignant neoplasm of breast: Secondary | ICD-10-CM

## 2016-02-24 ENCOUNTER — Other Ambulatory Visit: Payer: Self-pay | Admitting: Internal Medicine

## 2016-02-24 ENCOUNTER — Ambulatory Visit
Admission: RE | Admit: 2016-02-24 | Discharge: 2016-02-24 | Disposition: A | Discharge status: Home / Self Care | Payer: Medicare Other | Source: Ambulatory Visit

## 2016-02-24 DIAGNOSIS — Z1231 Encounter for screening mammogram for malignant neoplasm of breast: Secondary | ICD-10-CM

## 2016-03-12 ENCOUNTER — Other Ambulatory Visit: Payer: Self-pay | Admitting: Internal Medicine

## 2016-03-13 ENCOUNTER — Other Ambulatory Visit: Payer: Self-pay | Admitting: Internal Medicine

## 2016-03-13 DIAGNOSIS — R911 Solitary pulmonary nodule: Secondary | ICD-10-CM

## 2016-03-21 ENCOUNTER — Ambulatory Visit
Admission: RE | Admit: 2016-03-21 | Discharge: 2016-03-21 | Disposition: A | Discharge status: Home / Self Care | Payer: Medicare Other | Source: Ambulatory Visit | Attending: Internal Medicine | Admitting: Internal Medicine

## 2016-03-21 DIAGNOSIS — R911 Solitary pulmonary nodule: Secondary | ICD-10-CM

## 2016-08-22 IMAGING — CT CT PARANASAL SINUSES LIMITED
1 of 2 series · 16 of 19 positions shown, 20 images · non-contrast
Comparison: Facial films of 01/08/2015

CLINICAL DATA: Chronic productive cough for 8 months

EXAM:
CT PARANASAL SINUS LIMITED WITHOUT CONTRAST
TECHNIQUE: Non-contiguous multidetector CT images of the paranasal sinuses were
obtained in a single plane without contrast.

[Series 4: ltd sinus 3.0 h30s · axial · 0.34mm/px · z∈[-65,+33]mm · 16 of 18 slices shown, 20 images]
[im 2/18  brain]
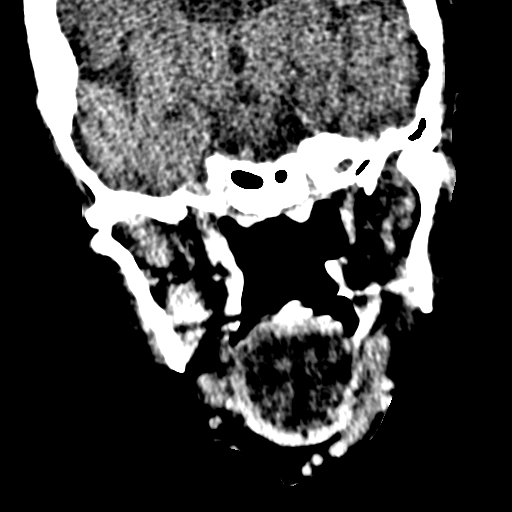
[im 2/18  bone]
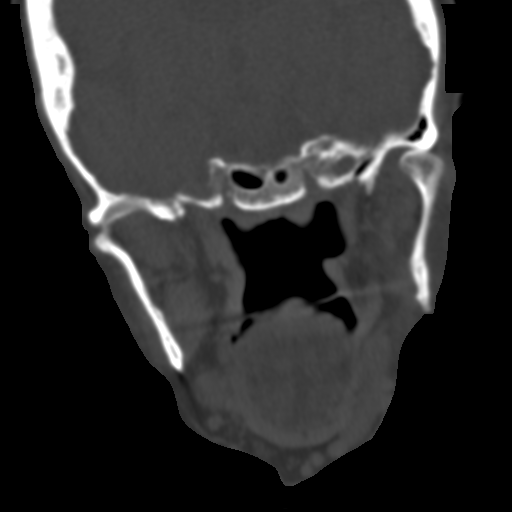
[im 3/18  bone]
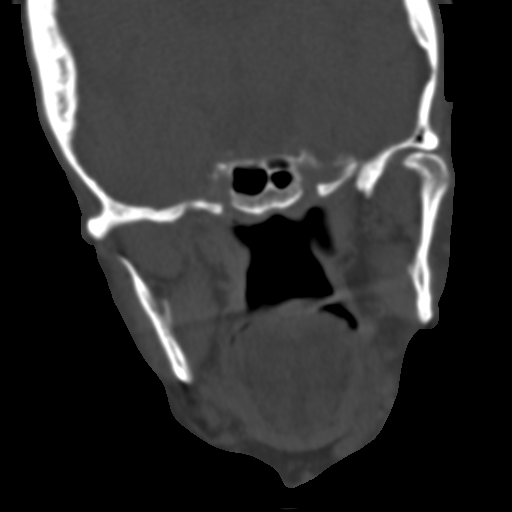
[im 4/18  bone]
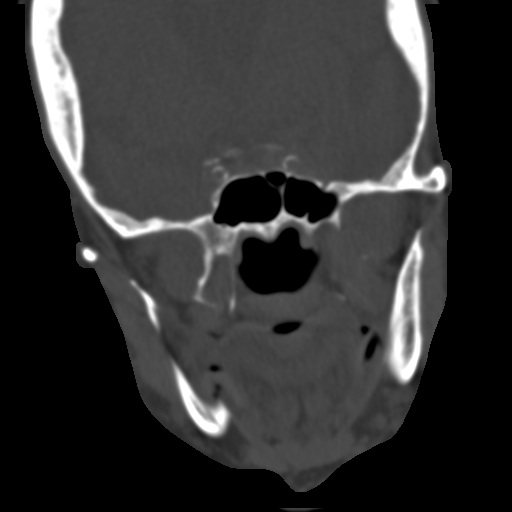
[im 5/18  bone]
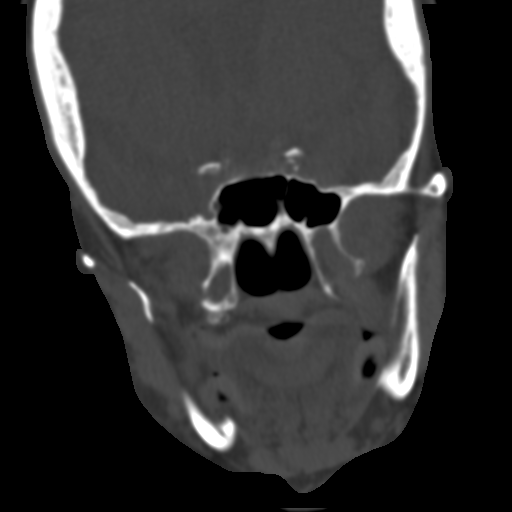
[im 6/18  brain]
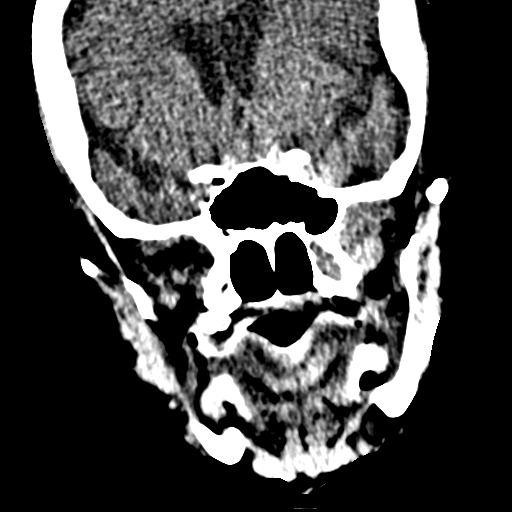
[im 6/18  bone]
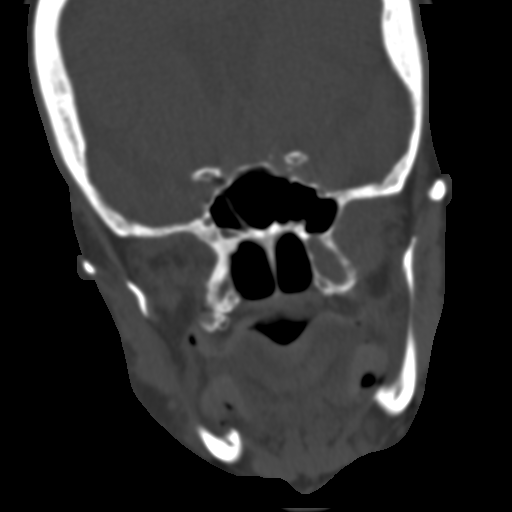
[im 7/18  bone]
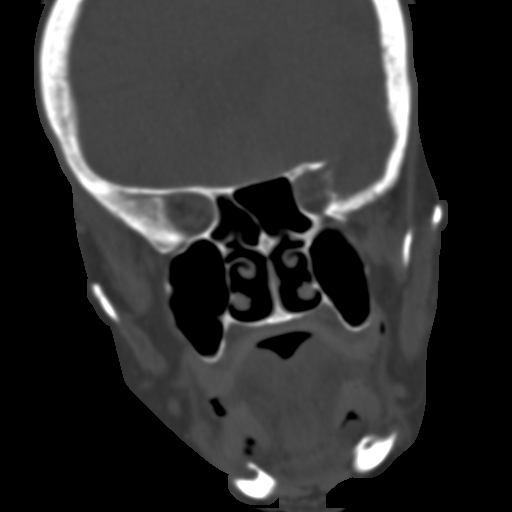
[im 8/18  bone]
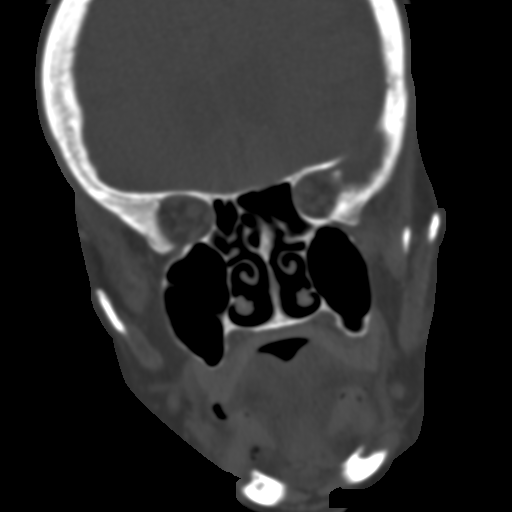
[im 9/18  bone]
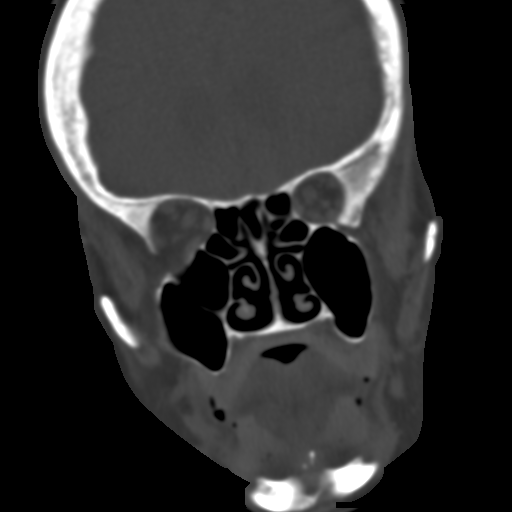
[im 10/18  brain]
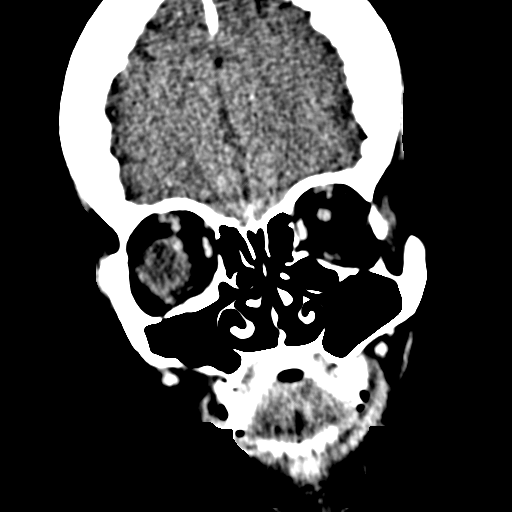
[im 10/18  bone]
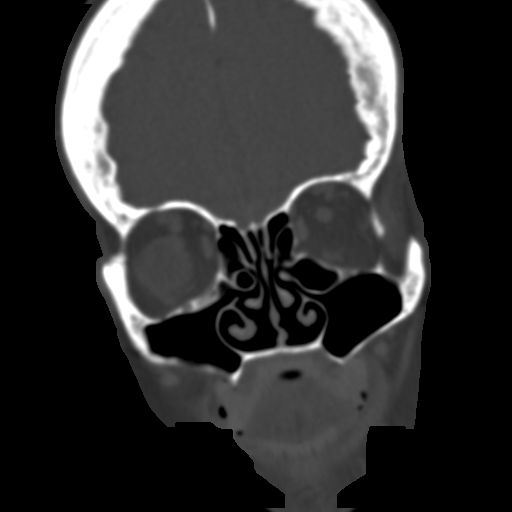
[im 11/18  bone]
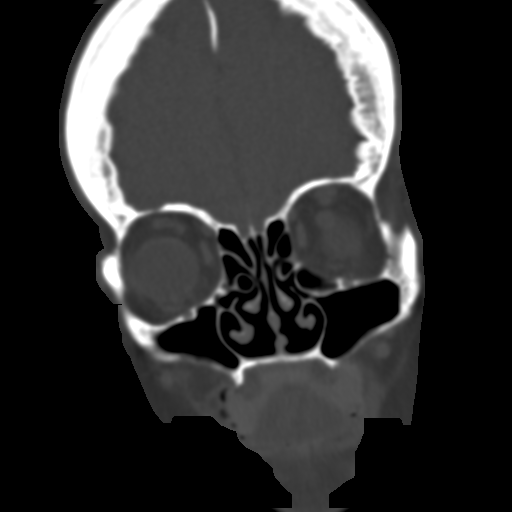
[im 12/18  bone]
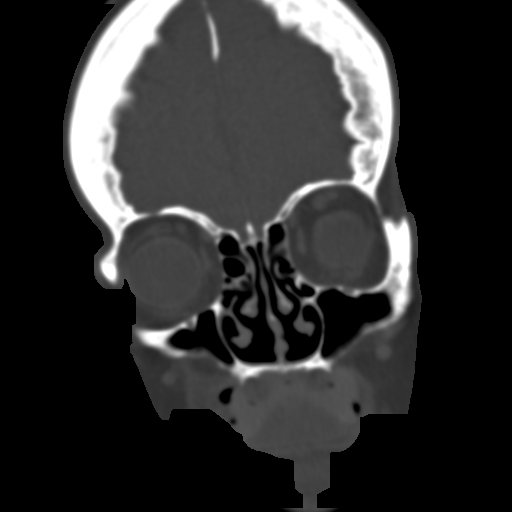
[im 13/18  bone]
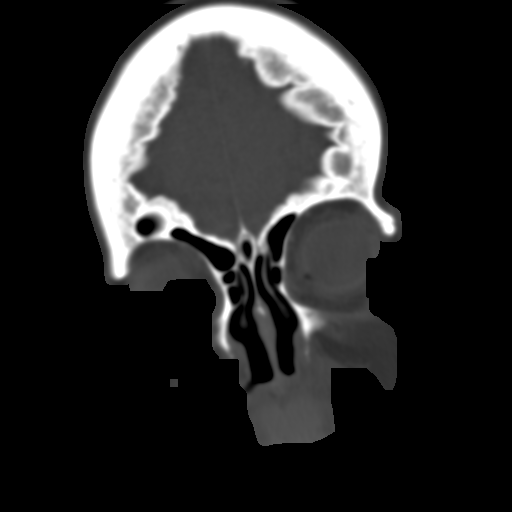
[im 14/18  brain]
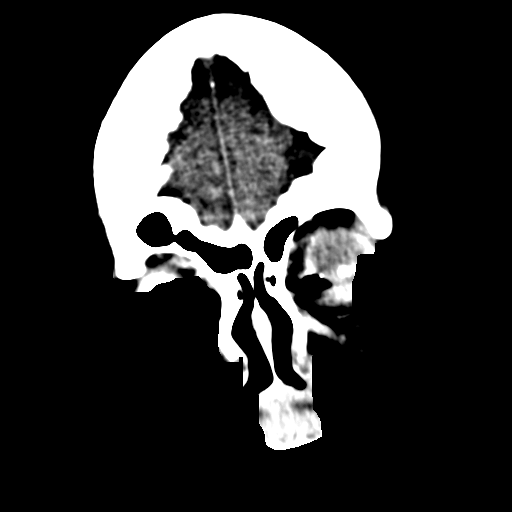
[im 14/18  bone]
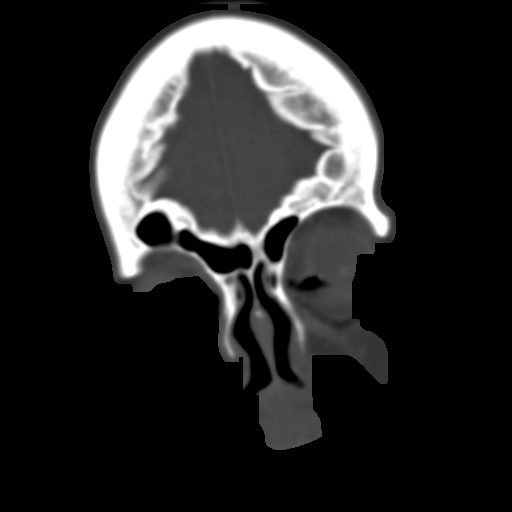
[im 15/18  bone]
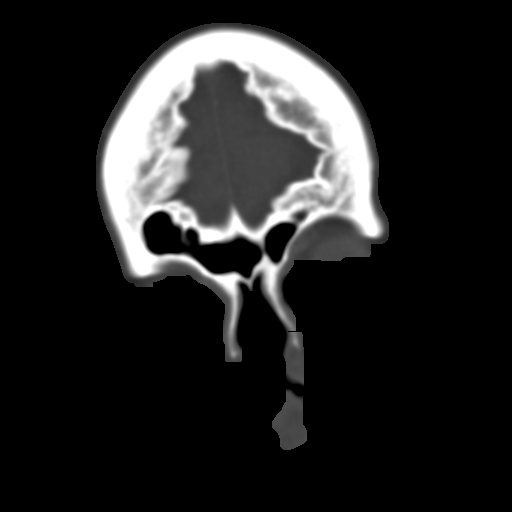
[im 16/18  bone]
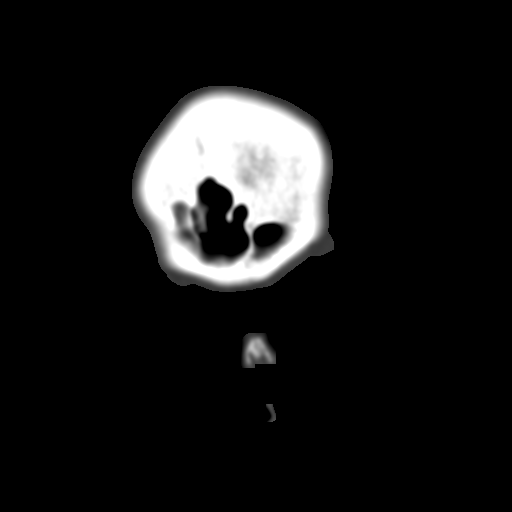
[im 17/18  bone]
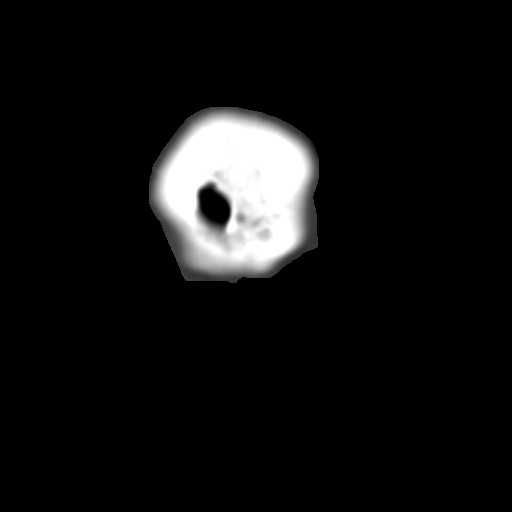

[16 of 19 positions shown; findings below may reference images not displayed]

FINDINGS: The paranasal sinuses are well pneumatized. There is no evidence of
sinusitis. No mucosal thickening is seen. The nasal turbinates are
diminutive and the nasal airway is patent. No nasal septal deviation
is seen.
IMPRESSION: No sinusitis.

## 2018-10-11 ENCOUNTER — Other Ambulatory Visit: Payer: Self-pay

## 2018-10-11 ENCOUNTER — Encounter (HOSPITAL_COMMUNITY): Payer: Self-pay | Admitting: Emergency Medicine

## 2018-10-11 ENCOUNTER — Emergency Department (HOSPITAL_COMMUNITY): Payer: Medicare HMO

## 2018-10-11 ENCOUNTER — Observation Stay (HOSPITAL_COMMUNITY)
Admission: EM | Admit: 2018-10-11 | Discharge: 2018-10-12 | Disposition: A | Discharge status: Home Health | Payer: Medicare HMO | Attending: Internal Medicine | Admitting: Internal Medicine

## 2018-10-11 DIAGNOSIS — I10 Essential (primary) hypertension: Secondary | ICD-10-CM | POA: Diagnosis not present

## 2018-10-11 DIAGNOSIS — K449 Diaphragmatic hernia without obstruction or gangrene: Secondary | ICD-10-CM | POA: Diagnosis not present

## 2018-10-11 DIAGNOSIS — R262 Difficulty in walking, not elsewhere classified: Secondary | ICD-10-CM | POA: Diagnosis not present

## 2018-10-11 DIAGNOSIS — Z9012 Acquired absence of left breast and nipple: Secondary | ICD-10-CM | POA: Insufficient documentation

## 2018-10-11 DIAGNOSIS — F0390 Unspecified dementia without behavioral disturbance: Secondary | ICD-10-CM | POA: Diagnosis not present

## 2018-10-11 DIAGNOSIS — M1990 Unspecified osteoarthritis, unspecified site: Secondary | ICD-10-CM | POA: Diagnosis not present

## 2018-10-11 DIAGNOSIS — Z881 Allergy status to other antibiotic agents status: Secondary | ICD-10-CM | POA: Insufficient documentation

## 2018-10-11 DIAGNOSIS — Z8719 Personal history of other diseases of the digestive system: Secondary | ICD-10-CM | POA: Insufficient documentation

## 2018-10-11 DIAGNOSIS — K921 Melena: Secondary | ICD-10-CM | POA: Diagnosis not present

## 2018-10-11 DIAGNOSIS — G7000 Myasthenia gravis without (acute) exacerbation: Secondary | ICD-10-CM | POA: Diagnosis not present

## 2018-10-11 DIAGNOSIS — Z7982 Long term (current) use of aspirin: Secondary | ICD-10-CM | POA: Insufficient documentation

## 2018-10-11 DIAGNOSIS — R531 Weakness: Secondary | ICD-10-CM | POA: Insufficient documentation

## 2018-10-11 DIAGNOSIS — F419 Anxiety disorder, unspecified: Secondary | ICD-10-CM | POA: Diagnosis not present

## 2018-10-11 DIAGNOSIS — Z885 Allergy status to narcotic agent status: Secondary | ICD-10-CM | POA: Insufficient documentation

## 2018-10-11 DIAGNOSIS — Z853 Personal history of malignant neoplasm of breast: Secondary | ICD-10-CM | POA: Diagnosis not present

## 2018-10-11 DIAGNOSIS — E785 Hyperlipidemia, unspecified: Secondary | ICD-10-CM | POA: Insufficient documentation

## 2018-10-11 DIAGNOSIS — D62 Acute posthemorrhagic anemia: Secondary | ICD-10-CM | POA: Diagnosis not present

## 2018-10-11 DIAGNOSIS — Z79899 Other long term (current) drug therapy: Secondary | ICD-10-CM | POA: Diagnosis not present

## 2018-10-11 DIAGNOSIS — Z800 Family history of malignant neoplasm of digestive organs: Secondary | ICD-10-CM | POA: Insufficient documentation

## 2018-10-11 DIAGNOSIS — D649 Anemia, unspecified: Secondary | ICD-10-CM | POA: Diagnosis not present

## 2018-10-11 DIAGNOSIS — K222 Esophageal obstruction: Secondary | ICD-10-CM | POA: Diagnosis not present

## 2018-10-11 DIAGNOSIS — K2970 Gastritis, unspecified, without bleeding: Secondary | ICD-10-CM | POA: Diagnosis not present

## 2018-10-11 DIAGNOSIS — Z8673 Personal history of transient ischemic attack (TIA), and cerebral infarction without residual deficits: Secondary | ICD-10-CM | POA: Insufficient documentation

## 2018-10-11 DIAGNOSIS — K922 Gastrointestinal hemorrhage, unspecified: Secondary | ICD-10-CM | POA: Diagnosis present

## 2018-10-11 LAB — CBC
HCT: 35 % — ABNORMAL LOW (ref 36.0–46.0)
Hemoglobin: 10.3 g/dL — ABNORMAL LOW (ref 12.0–15.0)
MCH: 24.2 pg — ABNORMAL LOW (ref 26.0–34.0)
MCHC: 29.4 g/dL — ABNORMAL LOW (ref 30.0–36.0)
MCV: 82.4 fL (ref 80.0–100.0)
NRBC: 0 % (ref 0.0–0.2)
Platelets: 294 10*3/uL (ref 150–400)
RBC: 4.25 MIL/uL (ref 3.87–5.11)
RDW: 15.2 % (ref 11.5–15.5)
WBC: 13.2 10*3/uL — AB (ref 4.0–10.5)

## 2018-10-11 LAB — BASIC METABOLIC PANEL
Anion gap: 8 (ref 5–15)
Anion gap: 9 (ref 5–15)
BUN: 12 mg/dL (ref 8–23)
BUN: 9 mg/dL (ref 8–23)
CHLORIDE: 108 mmol/L (ref 98–111)
CO2: 24 mmol/L (ref 22–32)
CO2: 23 mmol/L (ref 22–32)
Calcium: 9 mg/dL (ref 8.9–10.3)
Calcium: 9.1 mg/dL (ref 8.9–10.3)
Chloride: 103 mmol/L (ref 98–111)
Creatinine, Ser: 0.77 mg/dL (ref 0.44–1.00)
Creatinine, Ser: 0.64 mg/dL (ref 0.44–1.00)
GFR calc Af Amer: >60 mL/min (ref >60)
GFR calc Af Amer: >60 mL/min (ref >60)
GFR calc non Af Amer: >60 mL/min (ref >60)
Glucose, Bld: 112 mg/dL — ABNORMAL HIGH (ref 70–99)
Glucose, Bld: 99 mg/dL (ref 70–99)
POTASSIUM: 3.8 mmol/L (ref 3.5–5.1)
Potassium: 3.9 mmol/L (ref 3.5–5.1)
Sodium: 135 mmol/L (ref 135–145)
Sodium: 140 mmol/L (ref 135–145)

## 2018-10-11 LAB — CBC WITH DIFFERENTIAL/PLATELET
Abs Immature Granulocytes: 0.03 10*3/uL (ref 0.00–0.07)
BASOS PCT: 1 %
Basophils Absolute: 0.1 10*3/uL (ref 0.0–0.1)
EOS ABS: 0.3 10*3/uL (ref 0.0–0.5)
Eosinophils Relative: 5 %
HEMATOCRIT: 25.3 % — AB (ref 36.0–46.0)
Hemoglobin: 6.9 g/dL — CL (ref 12.0–15.0)
Immature Granulocytes: 1 %
LYMPHS ABS: 1.8 10*3/uL (ref 0.7–4.0)
Lymphocytes Relative: 38 %
MCH: 21.8 pg — AB (ref 26.0–34.0)
MCHC: 27.3 g/dL — ABNORMAL LOW (ref 30.0–36.0)
MCV: 80.1 fL (ref 80.0–100.0)
MONO ABS: 0.6 10*3/uL (ref 0.1–1.0)
MONOS PCT: 11 %
Neutro Abs: 2.1 10*3/uL (ref 1.7–7.7)
Neutrophils Relative %: 44 %
PLATELETS: 338 10*3/uL (ref 150–400)
RBC: 3.16 MIL/uL — ABNORMAL LOW (ref 3.87–5.11)
RDW: 15 % (ref 11.5–15.5)
WBC: 4.8 10*3/uL (ref 4.0–10.5)
nRBC: 0 % (ref 0.0–0.2)

## 2018-10-11 LAB — PROTIME-INR
INR: 1.07
Prothrombin Time: 13.8 seconds (ref 11.4–15.2)

## 2018-10-11 LAB — RETICULOCYTES
Immature Retic Fract: 9.9 % (ref 2.3–15.9)
Retic Ct Pct: 1 % (ref 0.4–3.1)

## 2018-10-11 LAB — IRON AND TIBC
Iron: 12 ug/dL — ABNORMAL LOW (ref 28–170)
Saturation Ratios: 2 % — ABNORMAL LOW (ref 10.4–31.8)
TIBC: 552 ug/dL — ABNORMAL HIGH (ref 250–450)
UIBC: 540 ug/dL

## 2018-10-11 LAB — URINALYSIS, ROUTINE W REFLEX MICROSCOPIC
Bilirubin Urine: NEGATIVE
GLUCOSE, UA: NEGATIVE mg/dL
Hgb urine dipstick: NEGATIVE
Ketones, ur: NEGATIVE mg/dL
Leukocytes, UA: NEGATIVE
Nitrite: NEGATIVE
PROTEIN: NEGATIVE mg/dL
Specific Gravity, Urine: 1.006 (ref 1.005–1.030)
pH: 6 (ref 5.0–8.0)

## 2018-10-11 LAB — PREPARE RBC (CROSSMATCH)

## 2018-10-11 LAB — ABO/RH: ABO/RH(D): A POS

## 2018-10-11 LAB — POC OCCULT BLOOD, ED: Fecal Occult Bld: NEGATIVE

## 2018-10-11 LAB — FOLATE: Folate: 29.1 ng/mL (ref >5.9)

## 2018-10-11 LAB — FERRITIN: Ferritin: 3 ng/mL — ABNORMAL LOW (ref 11–307)

## 2018-10-11 LAB — APTT: aPTT: 26 seconds (ref 24–36)

## 2018-10-11 LAB — VITAMIN B12: VITAMIN B 12: 1494 pg/mL — AB (ref 180–914)

## 2018-10-11 MED ORDER — ACETAMINOPHEN 325 MG PO TABS
650.0000 mg | ORAL_TABLET | Freq: Four times a day (QID) | ORAL | Status: DC | PRN
  Administered 2018-10-11: 650 mg via ORAL
  Filled 2018-10-11: qty 2

## 2018-10-11 MED ORDER — ONDANSETRON HCL 4 MG/2ML IJ SOLN: 4.0000 mg | Freq: Four times a day (QID) | INTRAMUSCULAR | Status: DC | PRN

## 2018-10-11 MED ORDER — FUROSEMIDE 10 MG/ML IJ SOLN
20.0000 mg | Freq: Once | INTRAMUSCULAR | Status: AC
  Administered 2018-10-11: 20 mg via INTRAVENOUS
  Filled 2018-10-11: qty 2

## 2018-10-11 MED ORDER — VITAMIN C 500 MG PO TABS
1000.0000 mg | ORAL_TABLET | Freq: Every day | ORAL | Status: DC
  Administered 2018-10-11 – 2018-10-12 (×2): 1000 mg via ORAL
  Filled 2018-10-11 (×2): qty 2

## 2018-10-11 MED ORDER — PANTOPRAZOLE SODIUM 40 MG IV SOLR: 40.0000 mg | Freq: Two times a day (BID) | INTRAVENOUS | Status: DC

## 2018-10-11 MED ORDER — ACETAMINOPHEN 650 MG RE SUPP: 650.0000 mg | Freq: Four times a day (QID) | RECTAL | Status: DC | PRN

## 2018-10-11 MED ORDER — SODIUM CHLORIDE 0.9 % IV BOLUS
500.0000 mL | Freq: Once | INTRAVENOUS | Status: AC
  Administered 2018-10-11: 500 mL via INTRAVENOUS

## 2018-10-11 MED ORDER — ONDANSETRON HCL 4 MG PO TABS: 4.0000 mg | ORAL_TABLET | Freq: Four times a day (QID) | ORAL | Status: DC | PRN

## 2018-10-11 MED ORDER — ZOLPIDEM TARTRATE 5 MG PO TABS: 5.0000 mg | ORAL_TABLET | Freq: Every evening | ORAL | Status: DC | PRN

## 2018-10-11 MED ORDER — HYDRALAZINE HCL 20 MG/ML IJ SOLN: 5.0000 mg | INTRAMUSCULAR | Status: DC | PRN

## 2018-10-11 MED ORDER — SODIUM CHLORIDE 0.9 % IV SOLN
80.0000 mg | Freq: Once | INTRAVENOUS | Status: AC
  Administered 2018-10-11: 80 mg via INTRAVENOUS
  Filled 2018-10-11: qty 80

## 2018-10-11 MED ORDER — SODIUM CHLORIDE 0.9 % IV SOLN: 10.0000 mL/h | Freq: Once | INTRAVENOUS | Status: DC

## 2018-10-11 MED ORDER — CALCIUM CARBONATE-VITAMIN D 500-200 MG-UNIT PO TABS
2.0000 | ORAL_TABLET | Freq: Every day | ORAL | Status: DC
  Administered 2018-10-11 – 2018-10-12 (×2): 2 via ORAL
  Filled 2018-10-11 (×2): qty 2

## 2018-10-11 MED ORDER — VITAMIN B-12 1000 MCG PO TABS
1000.0000 ug | ORAL_TABLET | Freq: Every day | ORAL | Status: DC
  Administered 2018-10-11 – 2018-10-12 (×2): 1000 ug via ORAL
  Filled 2018-10-11 (×2): qty 1

## 2018-10-11 MED ORDER — PYRIDOSTIGMINE BROMIDE 60 MG PO TABS
60.0000 mg | ORAL_TABLET | Freq: Four times a day (QID) | ORAL | Status: DC
  Administered 2018-10-11 – 2018-10-12 (×6): 60 mg via ORAL
  Filled 2018-10-11 (×8): qty 1

## 2018-10-11 MED ORDER — SODIUM CHLORIDE 0.9 % IV SOLN
8.0000 mg/h | INTRAVENOUS | Status: DC
  Administered 2018-10-11 – 2018-10-12 (×3): 8 mg/h via INTRAVENOUS
  Filled 2018-10-11 (×5): qty 80

## 2018-10-11 MED ORDER — SENNOSIDES-DOCUSATE SODIUM 8.6-50 MG PO TABS: 1.0000 | ORAL_TABLET | Freq: Every evening | ORAL | Status: DC | PRN

## 2018-10-11 NOTE — H&P (View-Only) (Signed)
Referring Provider:  Dr. Nevada Crane Primary Care Physician:  Orvis Brill, PA-C Primary Gastroenterologist: Althia Forts  Reason for Consultation: Symptomatic anemia/dark stools  HPI: Elaine Owens is a 85 y.o. female with past medical history of hypertension, hyperlipidemia, history of myasthenia gravis, history of pernicious anemia, and remote history of breast cancer presented to the hospital with generalized weakness and dark stools.  Upon initial evaluation in the emergency room, she was found to have hemoglobin of 6.9.  Her hemoglobin was around 10.6 in October 2019.  FOBT was negative.  GI is consulted for further evaluation.  Notes from care everywhere reviewed.  Looks like she was admitted at [facility] in November 2018 for GI bleed and had a EGD at that time which showed proximal fundic lesser curvature ulcer of around 1.5 cm with nonbleeding visible vessel which was treated with injection and cautery.  Biopsies were negative for H. pylori.  Patient had normal colonoscopy in 2018 per previous GI notes from [facility].  Patient seen and examined at bedside.  She is complaining of intermittent dark stool for last few months.  Also complaining of intermittent epigastric discomfort.  Denies bright red blood per rectum.  Denies diarrhea or constipation.  Patient's youngest daughter at bedside.  According to her she was taking ibuprofen as well as aspirin and was advised to stop it 1 week ago by primary care physician.  Since stopping those medications, her stools are now light brown in color.  Past Medical History:  Diagnosis Date  . Arthritis   . Complication of anesthesia    hard to wake up  . Hypertension   . Immune deficiency disorder ([place])   . lt breast ca dx'd 1978 or 1988 pt unsure   lt masectomy; tamoxifen completed  . Myasthenia gravis [facility])     Past Surgical History:  Procedure Laterality Date  . ABDOMINAL HYSTERECTOMY    . APPENDECTOMY    . BREAST  SURGERY    . EYE SURGERY     lens implant  . HEMORRHOID SURGERY    . masectomny Left    1978  . MASTECTOMY      Prior to Admission medications   Medication Sig Start Date End Date Taking? Authorizing Provider  acetaminophen (ACETAMINOPHEN 8 HOUR) 650 MG CR tablet Take 1,300 mg by mouth 2 (two) times daily.   Yes [provider]  Ascorbic Acid (VITAMIN C) 1000 MG tablet Take 1,000 mg by mouth daily.   Yes [provider]  Calcium Carbonate-Vitamin D (CALCIUM-VITAMIN D) 600-125 MG-UNIT TABS Take 2 tablets by mouth daily.   Yes [provider]  ibuprofen (ADVIL,MOTRIN) 200 MG tablet Take 400 mg by mouth every 6 (six) hours as needed for moderate pain.   Yes [provider]  pyridostigmine (MESTINON) 60 MG tablet Take 60 mg by mouth 4 (four) times daily.    Yes [provider]  vitamin B-12 (CYANOCOBALAMIN) 1000 MCG tablet Take 1,000 mcg by mouth daily.   Yes [provider]  fluticasone (FLONASE) 50 MCG/ACT nasal spray Place 2 sprays into both nostrils daily. Patient not taking: Reported on 10/11/2018 04/22/15   Chesley Mires, MD  ondansetron (ZOFRAN ODT) 4 MG disintegrating tablet Take 1 tablet (4 mg total) by mouth every 8 (eight) hours as needed for nausea or vomiting. Patient not taking: Reported on 10/11/2018 09/20/15   Gareth Morgan, MD  oxyCODONE-acetaminophen (PERCOCET/ROXICET) 5-325 MG tablet Take 1 tablet by mouth every 4 (four) hours as needed for severe  pain. Patient not taking: Reported on 10/11/2018 09/20/15   Gareth Morgan, MD    Scheduled Meds: . calcium-vitamin D  2 tablet Oral Daily  . [START ON 10/14/2018] pantoprazole  40 mg Intravenous Q12H  . pyridostigmine  60 mg Oral QID  . vitamin B-12  1,000 mcg Oral Daily  . vitamin C  1,000 mg Oral Daily   Continuous Infusions: . sodium chloride    . pantoprozole (PROTONIX) infusion 8 mg/hr (10/11/18 0521)   PRN Meds:.acetaminophen **OR** acetaminophen, hydrALAZINE,  ondansetron **OR** ondansetron (ZOFRAN) IV, senna-docusate, zolpidem  Allergies as of 10/11/2018 - Review Complete 10/11/2018  Allergen Reaction Noted  . Ciprofloxacin Other (See Comments) 03/20/2013  . Doxycycline hyclate Other (See Comments) 03/19/2013  . Morphine Nausea And Vomiting 10/17/2014  . Morphine and related Nausea And Vomiting 03/19/2013    Family History  Problem Relation Age of Onset  . Colon cancer Father   . Asthma Father   . Liver cancer Mother     Social History   Socioeconomic History  . Marital status: Single    Spouse name: Not on file  . Number of children: Not on file  . Years of education: Not on file  . Highest education level: Not on file  Occupational History  . Occupation: Retired  Scientific laboratory technician  . Financial resource strain: Not on file  . Food insecurity:    Worry: Not on file    Inability: Not on file  . Transportation needs:    Medical: Not on file    Non-medical: Not on file  Tobacco Use  . Smoking status: Never Smoker  . Smokeless tobacco: Never Used  Substance and Sexual Activity  . Alcohol use: No  . Drug use: Not on file  . Sexual activity: Never  Lifestyle  . Physical activity:    Days per week: Not on file    Minutes per session: Not on file  . Stress: Not on file  Relationships  . Social connections:    Talks on phone: Not on file    Gets together: Not on file    Attends religious service: Not on file    Active member of club or organization: Not on file    Attends meetings of clubs or organizations: Not on file    Relationship status: Not on file  . Intimate partner violence:    Fear of current or ex partner: Not on file    Emotionally abused: Not on file    Physically abused: Not on file    Forced sexual activity: Not on file  Other Topics Concern  . Not on file  Social History Narrative  . Not on file    Review of Systems:Review of Systems  Constitutional: Positive for malaise/fatigue. Negative for chills and  fever.  HENT: Negative for hearing loss and tinnitus.   Eyes: Positive for blurred vision. Negative for double vision.  Respiratory: Positive for cough and shortness of breath.   Cardiovascular: Negative for chest pain and palpitations.  Gastrointestinal: Positive for abdominal pain, heartburn and melena. Negative for nausea and vomiting.  Genitourinary: Negative for dysuria and urgency.  Musculoskeletal: Positive for back pain, joint pain and myalgias.  Skin: Negative for itching and rash.  Neurological: Negative for seizures and loss of consciousness.  Endo/Heme/Allergies: Does not bruise/bleed easily.  Psychiatric/Behavioral: Negative for hallucinations and suicidal ideas.    Physical Exam: Vital signs: Vitals:   10/11/18 1209 10/11/18 1225  BP: (!) 151/54 (!) 147/54  Pulse: 87 90  Resp: 18 20  Temp: 98.7 F (37.1 C) 99.1 F (37.3 C)  SpO2: 99% 100%   Last BM Date: (PTA) Physical Exam  Constitutional: She is oriented to person, place, and time. She appears well-developed and well-nourished.  HENT:  Head: Normocephalic and atraumatic.  Mouth/Throat: Oropharynx is clear and moist. No oropharyngeal exudate.  Eyes: EOM are normal. No scleral icterus.  Neck: Normal range of motion. Neck supple.  Cardiovascular: Normal rate and regular rhythm.  Murmur heard. Pulmonary/Chest: Effort normal and breath sounds normal.  Abdominal: Soft. Bowel sounds are normal. She exhibits no distension. There is no abdominal tenderness. There is no rebound.  Musculoskeletal: Normal range of motion.        General: No edema.  Neurological: She is alert and oriented to person, place, and time.  Skin: Skin is warm. No erythema.  Psychiatric: She has a normal mood and affect. Thought content normal.  Vitals reviewed.   GI:  Lab Results: Recent Labs    10/11/18 0208  WBC 4.8  HGB 6.9*  HCT 25.3*  PLT 338   BMET Recent Labs    10/11/18 0208 10/11/18 0802  NA 135 140  K 3.8 3.9  CL  103 108  CO2 24 23  GLUCOSE 112* 99  BUN 12 9  CREATININE 0.77 0.64  CALCIUM 9.0 9.1   LFT No results for input(s): PROT, ALBUMIN, AST, ALT, ALKPHOS, BILITOT, BILIDIR, IBILI in the last 72 hours. PT/INR Recent Labs    10/11/18 0802  LABPROT 13.8  INR 1.07     Studies/Results: Ct Head Wo Contrast  Result Date: 10/11/2018 CLINICAL DATA:  85 year old with muscle weakness and confusion. EXAM: CT HEAD WITHOUT CONTRAST TECHNIQUE: Contiguous axial images were obtained from the base of the skull through the vertex without intravenous contrast. COMPARISON:  None. FINDINGS: Brain: Age related atrophy. Mild to moderate chronic small vessel ischemia. Remote right cerebellar infarct. No intracranial hemorrhage, mass effect, or midline shift. No hydrocephalus. The basilar cisterns are patent. No evidence of territorial infarct or acute ischemia. No extra-axial or intracranial fluid collection. Vascular: No hyperdense vessel. Skull: No fracture or focal lesion. Sinuses/Orbits: Paranasal sinuses and mastoid air cells are clear. The visualized orbits are unremarkable. Bilateral lens extraction. Other: None. IMPRESSION: 1. No acute intracranial abnormality. 2. Atrophy, chronic small vessel ischemia, and remote right cerebellar infarct. Electronically Signed   By: Keith Rake M.D.   On: 10/11/2018 02:40    Impression/Plan: -Melena with history of gastric ulcer requiring endoscopic treatment in November 2018. -Blood loss anemia -NSAID and aspirin use. -Myasthenia gravis  Recommendations ------------------------- -Continue PPI - Monitor  H&H. - EGD tomorrow.  Discussed with patient and patient's daughter.  They verbalized understanding. - NPO past midnight  - GI will follow.    LOS: 0 days   Otis Brace  MD, FACP 10/11/2018, 1:00 PM  Contact #  [phone]

## 2018-10-11 NOTE — Consult Note (Signed)
Referring Provider:  Dr. Nevada Crane Primary Care Physician:  Orvis Brill, PA-C Primary Gastroenterologist: Althia Forts  Reason for Consultation: Symptomatic anemia/dark stools  HPI: Elaine Owens is a 85 y.o. female with past medical history of hypertension, hyperlipidemia, history of myasthenia gravis, history of pernicious anemia, and remote history of breast cancer presented to the hospital with generalized weakness and dark stools.  Upon initial evaluation in the emergency room, she was found to have hemoglobin of 6.9.  Her hemoglobin was around 10.6 in October 2019.  FOBT was negative.  GI is consulted for further evaluation.  Notes from care everywhere reviewed.  Looks like she was admitted at [facility] in November 2018 for GI bleed and had a EGD at that time which showed proximal fundic lesser curvature ulcer of around 1.5 cm with nonbleeding visible vessel which was treated with injection and cautery.  Biopsies were negative for H. pylori.  Patient had normal colonoscopy in 2018 per previous GI notes from [facility].  Patient seen and examined at bedside.  She is complaining of intermittent dark stool for last few months.  Also complaining of intermittent epigastric discomfort.  Denies bright red blood per rectum.  Denies diarrhea or constipation.  Patient's youngest daughter at bedside.  According to her she was taking ibuprofen as well as aspirin and was advised to stop it 1 week ago by primary care physician.  Since stopping those medications, her stools are now light brown in color.  Past Medical History:  Diagnosis Date  . Arthritis   . Complication of anesthesia    hard to wake up  . Hypertension   . Immune deficiency disorder ([place] [place])   . lt breast ca dx'd 1978 or 1988 pt unsure   lt masectomy; tamoxifen completed  . Myasthenia gravis [facility])     Past Surgical History:  Procedure Laterality Date  . ABDOMINAL HYSTERECTOMY    . APPENDECTOMY    . BREAST  SURGERY    . EYE SURGERY     lens implant  . HEMORRHOID SURGERY    . masectomny Left    1978  . MASTECTOMY      Prior to Admission medications   Medication Sig Start Date End Date Taking? Authorizing Provider  acetaminophen (ACETAMINOPHEN 8 HOUR) 650 MG CR tablet Take 1,300 mg by mouth 2 (two) times daily.   Yes [provider]  Ascorbic Acid (VITAMIN C) 1000 MG tablet Take 1,000 mg by mouth daily.   Yes [provider]  Calcium Carbonate-Vitamin D (CALCIUM-VITAMIN D) 600-125 MG-UNIT TABS Take 2 tablets by mouth daily.   Yes [provider]  ibuprofen (ADVIL,MOTRIN) 200 MG tablet Take 400 mg by mouth every 6 (six) hours as needed for moderate pain.   Yes [provider]  pyridostigmine (MESTINON) 60 MG tablet Take 60 mg by mouth 4 (four) times daily.    Yes [provider]  vitamin B-12 (CYANOCOBALAMIN) 1000 MCG tablet Take 1,000 mcg by mouth daily.   Yes [provider]  fluticasone (FLONASE) 50 MCG/ACT nasal spray Place 2 sprays into both nostrils daily. Patient not taking: Reported on 10/11/2018 04/22/15   Chesley Mires, MD  ondansetron (ZOFRAN ODT) 4 MG disintegrating tablet Take 1 tablet (4 mg total) by mouth every 8 (eight) hours as needed for nausea or vomiting. Patient not taking: Reported on 10/11/2018 09/20/15   Gareth Morgan, MD  oxyCODONE-acetaminophen (PERCOCET/ROXICET) 5-325 MG tablet Take 1 tablet by mouth every 4 (four) hours as needed for severe  pain. Patient not taking: Reported on 10/11/2018 09/20/15   Gareth Morgan, MD    Scheduled Meds: . calcium-vitamin D  2 tablet Oral Daily  . [START ON 10/14/2018] pantoprazole  40 mg Intravenous Q12H  . pyridostigmine  60 mg Oral QID  . vitamin B-12  1,000 mcg Oral Daily  . vitamin C  1,000 mg Oral Daily   Continuous Infusions: . sodium chloride    . pantoprozole (PROTONIX) infusion 8 mg/hr (10/11/18 0521)   PRN Meds:.acetaminophen **OR** acetaminophen, hydrALAZINE,  ondansetron **OR** ondansetron (ZOFRAN) IV, senna-docusate, zolpidem  Allergies as of 10/11/2018 - Review Complete 10/11/2018  Allergen Reaction Noted  . Ciprofloxacin Other (See Comments) 03/20/2013  . Doxycycline hyclate Other (See Comments) 03/19/2013  . Morphine Nausea And Vomiting 10/17/2014  . Morphine and related Nausea And Vomiting 03/19/2013    Family History  Problem Relation Age of Onset  . Colon cancer Father   . Asthma Father   . Liver cancer Mother     Social History   Socioeconomic History  . Marital status: Single    Spouse name: Not on file  . Number of children: Not on file  . Years of education: Not on file  . Highest education level: Not on file  Occupational History  . Occupation: Retired  Scientific laboratory technician  . Financial resource strain: Not on file  . Food insecurity:    Worry: Not on file    Inability: Not on file  . Transportation needs:    Medical: Not on file    Non-medical: Not on file  Tobacco Use  . Smoking status: Never Smoker  . Smokeless tobacco: Never Used  Substance and Sexual Activity  . Alcohol use: No  . Drug use: Not on file  . Sexual activity: Never  Lifestyle  . Physical activity:    Days per week: Not on file    Minutes per session: Not on file  . Stress: Not on file  Relationships  . Social connections:    Talks on phone: Not on file    Gets together: Not on file    Attends religious service: Not on file    Active member of club or organization: Not on file    Attends meetings of clubs or organizations: Not on file    Relationship status: Not on file  . Intimate partner violence:    Fear of current or ex partner: Not on file    Emotionally abused: Not on file    Physically abused: Not on file    Forced sexual activity: Not on file  Other Topics Concern  . Not on file  Social History Narrative  . Not on file    Review of Systems:Review of Systems  Constitutional: Positive for malaise/fatigue. Negative for chills and  fever.  HENT: Negative for hearing loss and tinnitus.   Eyes: Positive for blurred vision. Negative for double vision.  Respiratory: Positive for cough and shortness of breath.   Cardiovascular: Negative for chest pain and palpitations.  Gastrointestinal: Positive for abdominal pain, heartburn and melena. Negative for nausea and vomiting.  Genitourinary: Negative for dysuria and urgency.  Musculoskeletal: Positive for back pain, joint pain and myalgias.  Skin: Negative for itching and rash.  Neurological: Negative for seizures and loss of consciousness.  Endo/Heme/Allergies: Does not bruise/bleed easily.  Psychiatric/Behavioral: Negative for hallucinations and suicidal ideas.    Physical Exam: Vital signs: Vitals:   10/11/18 1209 10/11/18 1225  BP: (!) 151/54 (!) 147/54  Pulse: 87 90  Resp: 18 20  Temp: 98.7 F (37.1 C) 99.1 F (37.3 C)  SpO2: 99% 100%   Last BM Date: (PTA) Physical Exam  Constitutional: She is oriented to person, place, and time. She appears well-developed and well-nourished.  HENT:  Head: Normocephalic and atraumatic.  Mouth/Throat: Oropharynx is clear and moist. No oropharyngeal exudate.  Eyes: EOM are normal. No scleral icterus.  Neck: Normal range of motion. Neck supple.  Cardiovascular: Normal rate and regular rhythm.  Murmur heard. Pulmonary/Chest: Effort normal and breath sounds normal.  Abdominal: Soft. Bowel sounds are normal. She exhibits no distension. There is no abdominal tenderness. There is no rebound.  Musculoskeletal: Normal range of motion.        General: No edema.  Neurological: She is alert and oriented to person, place, and time.  Skin: Skin is warm. No erythema.  Psychiatric: She has a normal mood and affect. Thought content normal.  Vitals reviewed.   GI:  Lab Results: Recent Labs    10/11/18 0208  WBC 4.8  HGB 6.9*  HCT 25.3*  PLT 338   BMET Recent Labs    10/11/18 0208 10/11/18 0802  NA 135 140  K 3.8 3.9  CL  103 108  CO2 24 23  GLUCOSE 112* 99  BUN 12 9  CREATININE 0.77 0.64  CALCIUM 9.0 9.1   LFT No results for input(s): PROT, ALBUMIN, AST, ALT, ALKPHOS, BILITOT, BILIDIR, IBILI in the last 72 hours. PT/INR Recent Labs    10/11/18 0802  LABPROT 13.8  INR 1.07     Studies/Results: Ct Head Wo Contrast  Result Date: 10/11/2018 CLINICAL DATA:  85 year old with muscle weakness and confusion. EXAM: CT HEAD WITHOUT CONTRAST TECHNIQUE: Contiguous axial images were obtained from the base of the skull through the vertex without intravenous contrast. COMPARISON:  None. FINDINGS: Brain: Age related atrophy. Mild to moderate chronic small vessel ischemia. Remote right cerebellar infarct. No intracranial hemorrhage, mass effect, or midline shift. No hydrocephalus. The basilar cisterns are patent. No evidence of territorial infarct or acute ischemia. No extra-axial or intracranial fluid collection. Vascular: No hyperdense vessel. Skull: No fracture or focal lesion. Sinuses/Orbits: Paranasal sinuses and mastoid air cells are clear. The visualized orbits are unremarkable. Bilateral lens extraction. Other: None. IMPRESSION: 1. No acute intracranial abnormality. 2. Atrophy, chronic small vessel ischemia, and remote right cerebellar infarct. Electronically Signed   By: Keith Rake M.D.   On: 10/11/2018 02:40    Impression/Plan: -Melena with history of gastric ulcer requiring endoscopic treatment in November 2018. -Blood loss anemia -NSAID and aspirin use. -Myasthenia gravis  Recommendations ------------------------- -Continue PPI - Monitor  H&H. - EGD tomorrow.  Discussed with patient and patient's daughter.  They verbalized understanding. - NPO past midnight  - GI will follow.    LOS: 0 days   Otis Brace  MD, FACP 10/11/2018, 1:00 PM  Contact #  [phone]

## 2018-10-11 NOTE — Progress Notes (Signed)
Pt gave good effort with NIF/VC.  NIF-24, VC=1.1L

## 2018-10-11 NOTE — ED Notes (Signed)
Patient reports very dark stool for over a month, but over the past week it has cleared up.

## 2018-10-11 NOTE — Progress Notes (Signed)
Elaine Owens is a 85 y.o. female with medical history significant of hypertension, hyperlipidemia, remote breast cancer (left mastectomy, tamoxifen treatment completed), myasthenia gravis, anemia, GI bleeding, dementia, anxiety, who presents with dark stool, generalized weakness.  Patient states that she has been having intermittent dark stool for almost a month.  No nausea, vomiting, diarrhea or abdominal pain.  She stopped taking aspirin 1 week ago, but still takes ibuprofen intermittently for pain.   10/11/2018: Patient seen and examined with her daughter Aldona Bar at bedside.  She has no new complaints however she appears very weak.  She is receiving blood 2 unit PRBCs.  GI has been consulted and will see the patient.

## 2018-10-11 NOTE — Progress Notes (Signed)
PT Cancellation Note  Patient Details Name: Elaine Owens MRN: [id number] DOB: [birth year]   Cancelled Treatment:     PT order received but eval deferred 2* transfusion in progress.  Hgb 6.9 and pt very weak per RN.  Will follow.   Deren Degrazia 10/11/2018, 11:31 AM

## 2018-10-11 NOTE — ED Notes (Signed)
Date and time results received: 10/11/18 0232   Test: Hgb Critical Value:6.9  Name of Provider Notified: Dr. Florina Ou  Orders Received? Or Actions Taken?: see orders

## 2018-10-11 NOTE — ED Notes (Signed)
ED TO INPATIENT HANDOFF REPORT  Name/Age/Gender Elaine Owens 85 y.o. female  Code Status    Code Status Orders  (From admission, onward)         Start     Ordered   10/11/18 0348  Do not attempt resuscitation (DNR)  Continuous    Question Answer Comment  In the event of cardiac or respiratory ARREST Do not call a "code blue"   In the event of cardiac or respiratory ARREST Do not perform Intubation, CPR, defibrillation or ACLS   In the event of cardiac or respiratory ARREST Use medication by any route, position, wound care, and other measures to relive pain and suffering. May use oxygen, suction and manual treatment of airway obstruction as needed for comfort.      10/11/18 0348        Code Status History    Date Active Date Inactive Code Status Order ID Comments User Context   07/15/2013 1222 07/16/2013 2012 Full Code [id number]  Orson Eva, MD Inpatient   03/20/2013 0611 03/22/2013 1736 Full Code [id number]  Theodis Blaze, MD Inpatient      Home/SNF/Other Home  Chief Complaint difficulty breathing;weakness;PCP told her to come  Level of Care/Admitting Diagnosis ED Disposition    ED Disposition Condition [place]: [facility] [100102]  Level of Care: Telemetry [5]  Admit to tele based on following criteria: Other see comments  Comments: SOB  Diagnosis: Symptomatic anemia [2595638]  Admitting Physician: Ivor Costa [[id number]]  Attending Physician: Ivor Costa [[id number]]  PT Class (Do Not Modify): Observation [104]  PT Acc Code (Do Not Modify): Observation [10022]       Medical History Past Medical History:  Diagnosis Date  . Arthritis   . Complication of anesthesia    hard to wake up  . Hypertension   . Immune deficiency disorder ([place])   . lt breast ca dx'd 1978 or 1988 pt unsure   lt masectomy; tamoxifen completed  . Myasthenia gravis (HCC)     Allergies Allergies  Allergen Reactions  . Ciprofloxacin Other (See  Comments)    Can not take if patient has myastenia gravis  . Doxycycline Hyclate Other (See Comments)    Patient preference  . Morphine Nausea And Vomiting  . Morphine And Related Nausea And Vomiting    Mad her extremely sick    IV Location/Drains/Wounds Patient Lines/Drains/Airways Status   Active Line/Drains/Airways    Name:   Placement date:   Placement time:   Site:   Days:   Peripheral IV 10/11/18 Right Antecubital   10/11/18    0205    Antecubital   less than 1   Peripheral IV 10/11/18 Right Arm   10/11/18    0348    Arm   less than 1          Labs/Imaging Results for orders placed or performed during the hospital encounter of 10/11/18 (from the past 48 hour(s))  CBC with Differential/Platelet     Status: Abnormal   Collection Time: 10/11/18  2:08 AM  Result Value Ref Range   WBC 4.8 4.0 - 10.5 K/uL   RBC 3.16 (L) 3.87 - 5.11 MIL/uL   Hemoglobin 6.9 (LL) 12.0 - 15.0 g/dL    Comment: REPEATED TO VERIFY THIS CRITICAL RESULT HAS VERIFIED AND BEEN CALLED TO J,FRICKEY BY AISHA MOHAMED ON 01 25 2020 AT 0229, AND HAS BEEN READ BACK.     HCT 25.3 (L) 36.0 -  46.0 %   MCV 80.1 80.0 - 100.0 fL   MCH 21.8 (L) 26.0 - 34.0 pg   MCHC 27.3 (L) 30.0 - 36.0 g/dL   RDW 15.0 11.5 - 15.5 %   Platelets 338 150 - 400 K/uL   nRBC 0.0 0.0 - 0.2 %   Neutrophils Relative % 44 %   Neutro Abs 2.1 1.7 - 7.7 K/uL   Lymphocytes Relative 38 %   Lymphs Abs 1.8 0.7 - 4.0 K/uL   Monocytes Relative 11 %   Monocytes Absolute 0.6 0.1 - 1.0 K/uL   Eosinophils Relative 5 %   Eosinophils Absolute 0.3 0.0 - 0.5 K/uL   Basophils Relative 1 %   Basophils Absolute 0.1 0.0 - 0.1 K/uL   Immature Granulocytes 1 %   Abs Immature Granulocytes 0.03 0.00 - 0.07 K/uL    Comment: Performed at [facility], [place] [address] [place], [place] [address]  Basic metabolic panel     Status: Abnormal   Collection Time: 10/11/18  2:08 AM  Result Value Ref Range   Sodium 135 135 - 145 mmol/L    Potassium 3.8 3.5 - 5.1 mmol/L   Chloride 103 98 - 111 mmol/L   CO2 24 22 - 32 mmol/L   Glucose, Bld 112 (H) 70 - 99 mg/dL   BUN 12 8 - 23 mg/dL   Creatinine, Ser 0.77 0.44 - 1.00 mg/dL   Calcium 9.0 8.9 - 10.3 mg/dL   GFR calc non Af Amer >60 >60 mL/min   GFR calc Af Amer >60 >60 mL/min   Anion gap 8 5 - 15    Comment: Performed at [facility], [place] [address] [place], [place] [address]  Urinalysis, Routine w reflex microscopic     Status: Abnormal   Collection Time: 10/11/18  2:08 AM  Result Value Ref Range   Color, Urine STRAW (A) YELLOW   APPearance CLEAR CLEAR   Specific Gravity, Urine 1.006 1.005 - 1.030   pH 6.0 5.0 - 8.0   Glucose, UA NEGATIVE NEGATIVE mg/dL   Hgb urine dipstick NEGATIVE NEGATIVE   Bilirubin Urine NEGATIVE NEGATIVE   Ketones, ur NEGATIVE NEGATIVE mg/dL   Protein, ur NEGATIVE NEGATIVE mg/dL   Nitrite NEGATIVE NEGATIVE   Leukocytes, UA NEGATIVE NEGATIVE    Comment: Performed at [place] [address] [place], [place] [address]  Prepare RBC     Status: None   Collection Time: 10/11/18  2:49 AM  Result Value Ref Range   Order Confirmation      ORDER PROCESSED BY BLOOD BANK Performed at [facility], [place] [address] [place], [place] [address]   Type and screen [place]     Status: None (Preliminary result)   Collection Time: 10/11/18  2:49 AM  Result Value Ref Range   ABO/RH(D) A POS    Antibody Screen NEG    Sample Expiration 10/14/2018    Unit Number [id number]    Blood Component Type RED CELLS,LR    Unit division 00    Status of Unit ALLOCATED    Transfusion Status OK TO TRANSFUSE    Crossmatch Result      Compatible Performed at [facility], [place] [address] [place], [place] [address]    Unit Number [id number]    Blood Component Type RED CELLS,LR    Unit division 00    Status of Unit ALLOCATED    Transfusion Status OK TO  TRANSFUSE     Crossmatch Result Compatible   ABO/Rh     Status: None   Collection Time: 10/11/18  2:49 AM  Result Value Ref Range   ABO/RH(D)      A POS Performed at [facility], [place] [address] [place], [place] [address]   POC occult blood, ED Provider will collect     Status: None   Collection Time: 10/11/18  3:02 AM  Result Value Ref Range   Fecal Occult Bld NEGATIVE NEGATIVE   Ct Head Wo Contrast  Result Date: 10/11/2018 CLINICAL DATA:  85 year old with muscle weakness and confusion. EXAM: CT HEAD WITHOUT CONTRAST TECHNIQUE: Contiguous axial images were obtained from the base of the skull through the vertex without intravenous contrast. COMPARISON:  None. FINDINGS: Brain: Age related atrophy. Mild to moderate chronic small vessel ischemia. Remote right cerebellar infarct. No intracranial hemorrhage, mass effect, or midline shift. No hydrocephalus. The basilar cisterns are patent. No evidence of territorial infarct or acute ischemia. No extra-axial or intracranial fluid collection. Vascular: No hyperdense vessel. Skull: No fracture or focal lesion. Sinuses/Orbits: Paranasal sinuses and mastoid air cells are clear. The visualized orbits are unremarkable. Bilateral lens extraction. Other: None. IMPRESSION: 1. No acute intracranial abnormality. 2. Atrophy, chronic small vessel ischemia, and remote right cerebellar infarct. Electronically Signed   By: Keith Rake M.D.   On: 10/11/2018 02:40    Pending Labs Unresulted Labs (From admission, onward)    Start     Ordered   10/11/18 5009  Basic metabolic panel  Tomorrow morning,   R     10/11/18 0348   10/11/18 0500  Protime-INR  Tomorrow morning,   R     10/11/18 0348   10/11/18 0500  APTT  Tomorrow morning,   R     10/11/18 0348   10/11/18 0346  CBC  Now then every 6 hours,   R     10/11/18 0345   10/11/18 0344  Vitamin B12  (Anemia Panel (PNL))  Once,   R     10/11/18 0343   10/11/18 0344  Folate  (Anemia Panel (PNL))  Once,    R     10/11/18 0343   10/11/18 0344  Iron and TIBC  (Anemia Panel (PNL))  Once,   R     10/11/18 0343   10/11/18 0344  Ferritin  (Anemia Panel (PNL))  Once,   R     10/11/18 0343   10/11/18 0344  Reticulocytes  (Anemia Panel (PNL))  Once,   R     10/11/18 0343          Vitals/Pain Today's Vitals   10/11/18 0306 10/11/18 0307 10/11/18 0330 10/11/18 0400  BP: (!) 134/55  (!) 138/47 (!) 135/55  Pulse: 74  78 80  Resp: 20 20 19 17   Temp:      TempSrc:      SpO2: 99%  99% 98%  Weight:      Height:      PainSc:        Isolation Precautions No active isolations  Medications Medications  0.9 %  sodium chloride infusion (has no administration in time range)  vitamin B-12 (CYANOCOBALAMIN) tablet 1,000 mcg (has no administration in time range)  pyridostigmine (MESTINON) tablet 60 mg (has no administration in time range)  vitamin C (ASCORBIC ACID) tablet 1,000 mg (has no administration in time range)  Calcium-Vitamin D 600-125 MG-UNIT TABS 2 tablet (has no administration in time range)  pantoprazole (PROTONIX) 80 mg in sodium chloride 0.9 % 100 mL IVPB (80 mg Intravenous New Bag/Given 10/11/18 0413)  pantoprazole (PROTONIX) 80 mg in sodium chloride 0.9 % 250 mL (0.32 mg/mL) infusion (8 mg/hr Intravenous New Bag/Given 10/11/18 0414)  pantoprazole (PROTONIX) injection 40 mg (has no administration in time range)  acetaminophen (TYLENOL) tablet 650 mg (has no administration in time range)    Or  acetaminophen (TYLENOL) suppository 650 mg (has no administration in time range)  senna-docusate (Senokot-S) tablet 1 tablet (has no administration in time range)  ondansetron (ZOFRAN) tablet 4 mg (has no administration in time range)    Or  ondansetron (ZOFRAN) injection 4 mg (has no administration in time range)  hydrALAZINE (APRESOLINE) injection 5 mg (has no administration in time range)  zolpidem (AMBIEN) tablet 5 mg (has no administration in time range)    Mobility non-ambulatory

## 2018-10-11 NOTE — ED Provider Notes (Signed)
[facility] DEPT Provider Note: Georgena Spurling, MD, FACEP  CSN: [id number] MRN: [id number] ARRIVAL: 10/11/18 at [place]: [place]  Weakness   HISTORY OF PRESENT ILLNESS  10/11/18 1:56 AM Elaine Owens is a 85 y.o. female last seen at her baseline around 7 PM.  She had an annual physical with her PCP the day before yesterday without significant findings.  Yesterday evening about 830 the patient had difficulty sitting up in a sulfa and required assistance due to general weakness (patient stated she could hardly move).  She went to bed about 915 and awoke earlier this morning with generalized weakness and difficulty thinking of words.  She believes she was weaker on the left than the right.  She is denying pain.  Her symptoms have improved but her daughter states her speech is still slurred.  She has myasthenia gravis and recently had her physostigmine dose increased.  She took her last dose at bedtime.  The patient states her stools had been black for a while but have returned to normal over the past few days.   Past Medical History:  Diagnosis Date  . Arthritis   . Complication of anesthesia    hard to wake up  . Hypertension   . Immune deficiency disorder ([place])   . lt breast ca dx'd 1978 or 1988 pt unsure   lt masectomy; tamoxifen completed  . Myasthenia gravis [facility])     Past Surgical History:  Procedure Laterality Date  . ABDOMINAL HYSTERECTOMY    . APPENDECTOMY    . BREAST SURGERY    . EYE SURGERY     lens implant  . HEMORRHOID SURGERY    . masectomny Left    1978  . MASTECTOMY      Family History  Problem Relation Age of Onset  . Colon cancer Father   . Liver cancer Mother   . Asthma Father     Social History   Tobacco Use  . Smoking status: Never Smoker  . Smokeless tobacco: Never Used  Substance Use Topics  . Alcohol use: No  . Drug use: Not on file    Prior to Admission medications   Medication Sig Start Date End Date  Taking? Authorizing Provider  ALPRAZolam Duanne Moron) 0.25 MG tablet Take 1.5mg  at bedtime as needed for anxiety    [provider]  amLODipine (NORVASC) 5 MG tablet Take 5 mg by mouth every morning.     [provider]  aspirin EC 81 MG tablet Take 81 mg by mouth daily.    [provider]  Cholecalciferol (VITAMIN D) 2000 UNITS CAPS Take 2,000 Units by mouth daily.    [provider]  diphenhydramine-acetaminophen (TYLENOL PM) 25-500 MG TABS Take 1 tablet by mouth at bedtime as needed (for insomnia).    [provider]  donepezil (ARICEPT) 10 MG tablet Take 10 mg by mouth 2 (two) times daily.     [provider]  famotidine (PEPCID) 20 MG tablet Take 20 mg by mouth every evening.    [provider]  fluticasone (FLONASE) 50 MCG/ACT nasal spray Place 2 sprays into both nostrils daily. 04/22/15   Chesley Mires, MD  latanoprost (XALATAN) 0.005 % ophthalmic solution Place 1 drop into both eyes at bedtime.    [provider]  ondansetron (ZOFRAN ODT) 4 MG disintegrating tablet Take 1 tablet (4 mg total) by mouth every 8 (eight) hours as needed for nausea or vomiting. 09/20/15   Schlossman,  Junie Panning, MD  oxybutynin (DITROPAN) 5 MG tablet Take 5 mg by mouth 3 (three) times daily.     [provider]  oxyCODONE-acetaminophen (PERCOCET/ROXICET) 5-325 MG tablet Take 1 tablet by mouth every 4 (four) hours as needed for severe pain. 09/20/15   Gareth Morgan, MD  pyridostigmine (MESTINON) 60 MG tablet Take 1.5 tablets by mouth twice a day    [provider]  simvastatin (ZOCOR) 40 MG tablet Take 40 mg by mouth every evening.    [provider]    Allergies Ciprofloxacin; Doxycycline hyclate; Morphine; and Morphine and related   REVIEW OF SYSTEMS  Negative except as noted here or in the History of Present Illness.   PHYSICAL EXAMINATION  Initial Vital Signs Blood pressure (!) 141/75, pulse 90, temperature 98.4 F  (36.9 C), temperature source Oral, resp. rate 18, height 5\' 3"  (1.6 m), weight 59.9 kg, SpO2 100 %.  Examination General: Well-developed, well-nourished female in no acute distress; appearance consistent with age of record HENT: normocephalic; atraumatic Eyes: pupils equal, round and reactive to light; extraocular muscles intact Neck: supple; no bruit heard Heart: regular rate and rhythm Lungs: clear to auscultation bilaterally Abdomen: soft; nondistended; nontender; bowel sounds present Rectal: Normal sphincter tone; stool in vault brown, sent for Hemoccult Extremities: Chronic appearing deformity of toes; pulses normal; no edema Neurologic: Awake, alert and oriented to person, place, year, month but not day, date or POTUS; mild dysarthria; motor function intact in all extremities and symmetric; sensation intact and symmetric; no facial droop Skin: Warm and dry Psychiatric: Normal mood and affect   RESULTS  Summary of this visit's results, reviewed by myself:   EKG Interpretation  Date/Time:  Saturday October 11 2018 01:51:16 EST Ventricular Rate:  86 PR Interval:    QRS Duration: 95 QT Interval:  350 QTC Calculation: 419 R Axis:   -48 Text Interpretation:  Sinus rhythm Left anterior fascicular block Probable anteroseptal infarct, old Baseline wander in lead(s) V3 Artifact Rate is faster Confirmed by Amiliana Foutz, Jenny Reichmann [phone]) on 10/11/2018 1:56:24 AM      Laboratory Studies: Results for orders placed or performed during the hospital encounter of 10/11/18 (from the past 24 hour(s))  CBC with Differential/Platelet     Status: Abnormal   Collection Time: 10/11/18  2:08 AM  Result Value Ref Range   WBC 4.8 4.0 - 10.5 K/uL   RBC 3.16 (L) 3.87 - 5.11 MIL/uL   Hemoglobin 6.9 (LL) 12.0 - 15.0 g/dL   HCT 25.3 (L) 36.0 - 46.0 %   MCV 80.1 80.0 - 100.0 fL   MCH 21.8 (L) 26.0 - 34.0 pg   MCHC 27.3 (L) 30.0 - 36.0 g/dL   RDW 15.0 11.5 - 15.5 %   Platelets 338 150 - 400 K/uL   nRBC 0.0  0.0 - 0.2 %   Neutrophils Relative % 44 %   Neutro Abs 2.1 1.7 - 7.7 K/uL   Lymphocytes Relative 38 %   Lymphs Abs 1.8 0.7 - 4.0 K/uL   Monocytes Relative 11 %   Monocytes Absolute 0.6 0.1 - 1.0 K/uL   Eosinophils Relative 5 %   Eosinophils Absolute 0.3 0.0 - 0.5 K/uL   Basophils Relative 1 %   Basophils Absolute 0.1 0.0 - 0.1 K/uL   Immature Granulocytes 1 %   Abs Immature Granulocytes 0.03 0.00 - 0.07 K/uL  Basic metabolic panel     Status: Abnormal   Collection Time: 10/11/18  2:08 AM  Result Value Ref Range  Sodium 135 135 - 145 mmol/L   Potassium 3.8 3.5 - 5.1 mmol/L   Chloride 103 98 - 111 mmol/L   CO2 24 22 - 32 mmol/L   Glucose, Bld 112 (H) 70 - 99 mg/dL   BUN 12 8 - 23 mg/dL   Creatinine, Ser 0.77 0.44 - 1.00 mg/dL   Calcium 9.0 8.9 - 10.3 mg/dL   GFR calc non Af Amer >60 >60 mL/min   GFR calc Af Amer >60 >60 mL/min   Anion gap 8 5 - 15   Imaging Studies: Ct Head Wo Contrast  Result Date: 10/11/2018 CLINICAL DATA:  85 year old with muscle weakness and confusion. EXAM: CT HEAD WITHOUT CONTRAST TECHNIQUE: Contiguous axial images were obtained from the base of the skull through the vertex without intravenous contrast. COMPARISON:  None. FINDINGS: Brain: Age related atrophy. Mild to moderate chronic small vessel ischemia. Remote right cerebellar infarct. No intracranial hemorrhage, mass effect, or midline shift. No hydrocephalus. The basilar cisterns are patent. No evidence of territorial infarct or acute ischemia. No extra-axial or intracranial fluid collection. Vascular: No hyperdense vessel. Skull: No fracture or focal lesion. Sinuses/Orbits: Paranasal sinuses and mastoid air cells are clear. The visualized orbits are unremarkable. Bilateral lens extraction. Other: None. IMPRESSION: 1. No acute intracranial abnormality. 2. Atrophy, chronic small vessel ischemia, and remote right cerebellar infarct. Electronically Signed   By: Keith Rake M.D.   On: 10/11/2018 02:40     ED COURSE and MDM  Nursing notes and initial vitals signs, including pulse oximetry, reviewed.  Vitals:   10/11/18 0152 10/11/18 0154  BP: (!) 141/75   Pulse: 90   Resp: 18   Temp: 98.4 F (36.9 C)   TempSrc: Oral   SpO2: 100%   Weight:  59.9 kg  Height:  5\' 3"  (1.6 m)   3:00 AM Patient's generalized weakness could be an exacerbation of her myasthenia gravis especially given that she was short of breath earlier.  This could also have been a TIA.  It is clear that she has anemia and this could be manifesting as generalized weakness as well.  Two units of packed red blood cells have been ordered and we will have her admitted to the hospitalist service.  PROCEDURES    ED DIAGNOSES     ICD-10-CM   1. Generalized weakness R53.1   2. Anemia due to acute blood loss D62   3. Upper GI bleeding K92.2        Abednego Yeates, Jenny Reichmann, MD 10/11/18 [phone]

## 2018-10-11 NOTE — ED Notes (Signed)
Purewick was placed after pt's family said pt had been incontinent.

## 2018-10-11 NOTE — ED Notes (Signed)
ED Provider at bedside. 

## 2018-10-11 NOTE — ED Notes (Signed)
MD decided not to call code stroke.

## 2018-10-11 NOTE — ED Notes (Addendum)
Code Stroke cancelled per MD.

## 2018-10-11 NOTE — ED Triage Notes (Signed)
Patient was last ok around 7 pm. Patient had eaten and was ok. Patient called for her daughter around 8:28 and stated she could not hardly move. Patient is having trouble getting words out.

## 2018-10-11 NOTE — ED Notes (Signed)
Report given to Anna, RN 

## 2018-10-11 NOTE — ED Notes (Signed)
Last known well around 1900 1/25, C/C confusion and weakness. At 1700-1730 patient complained of "not feeling well," at 1900 she ate dinner, at 2030 she yelled for help, patient complained that she could not sit up, patient reported left sided weakness and had trouble pronouncing her MG. Patient is incontinent, and pure-wic applied.

## 2018-10-11 NOTE — ED Notes (Addendum)
Called Code Stroke@01 :54am per RN,Jeneen.

## 2018-10-11 NOTE — Progress Notes (Signed)
NIF -17 VC 0.8L   Pt had good effort but is very weak .

## 2018-10-11 NOTE — Progress Notes (Signed)
NIF -28  VC 1.0L  Pt had good effort.

## 2018-10-11 NOTE — ED Notes (Signed)
Patient transported to CT 

## 2018-10-11 NOTE — H&P (Addendum)
History and Physical    Elaine Owens XNA:[id number] DOB: Jul 06, 1926 DOA: 10/11/2018  Referring MD/NP/PA:   PCP: Orvis Brill, PA-C   Patient coming from:  The patient is coming from home.  At baseline, pt is dependent for most of ADL.        Chief Complaint: Dark stool, generalized weakness  HPI: Elaine Owens is a 85 y.o. female with medical history significant of hypertension, hyperlipidemia, remote breast cancer (left mastectomy, tamoxifen treatment completed), myasthenia gravis, anemia, GI bleeding, dementia, anxiety, who presents with dark stool, generalized weakness.  Patient states that she has been having intermittent dark stool for almost a month.  No nausea, vomiting, diarrhea or abdominal pain.  She stopped taking aspirin 1 week ago, but still takes ibuprofen intermittently for pain.  Per her daughter, she had an annual physical with her PCP the day before yesterday without significant findings. Pt developed worsening weakness last night at about 8:30 PM. She had difficulty sitting up and required assistance.  Per her daughter, patient seems to have generalized weakness rather than unilateral weakness.  No facial droop.  Patient seems to have difficult speaking per her daughter.She also had mild shortness of breath earlier, which has improved now.  Denies chest pain, cough, fever or chills.  No symptoms of UTI.  No vision change. Of note, she has myasthenia gravis and recently had her pyridostigmine dose increased.  Patient states that she never had EGD or colonoscopy before, but she had GIB listed in her problem list?  ED Course: pt was found to have hemoglobin dropped from 10.6 on 06/23/2018 to 6.9, negative FOBT, electrolytes renal function okay, temperature normal, heart rate 90s, no tachypnea, oxygen saturation 100% on room air.  CT head is negative for acute intracranial abnormalities the patient is placed on telemetry bed of observation.  Review of Systems:    General: no fevers, chills, no body weight gain, has poor appetite, has fatigue HEENT: no blurry vision, hearing changes or sore throat Respiratory: had dyspnea, no coughing, wheezing CV: no chest pain, no palpitations GI: no nausea, vomiting, abdominal pain, diarrhea, constipation. Has dark stool. GU: no dysuria, burning on urination, increased urinary frequency, hematuria  Ext: no leg edema Neuro: has generalized weakness, difficulty speaking Skin: no rash, no skin tear. MSK: No muscle spasm, no deformity, no limitation of range of movement in spin Heme: No easy bruising.  Travel history: No recent long distant travel.  Allergy:  Allergies  Allergen Reactions  . Ciprofloxacin Other (See Comments)    Can not take if patient has myastenia gravis  . Doxycycline Hyclate Other (See Comments)    Patient preference  . Morphine Nausea And Vomiting  . Morphine And Related Nausea And Vomiting    Mad her extremely sick    Past Medical History:  Diagnosis Date  . Arthritis   . Complication of anesthesia    hard to wake up  . Hypertension   . Immune deficiency disorder ([place])   . lt breast ca dx'd 1978 or 1988 pt unsure   lt masectomy; tamoxifen completed  . Myasthenia gravis [facility])     Past Surgical History:  Procedure Laterality Date  . ABDOMINAL HYSTERECTOMY    . APPENDECTOMY    . BREAST SURGERY    . EYE SURGERY     lens implant  . HEMORRHOID SURGERY    . masectomny Left    1978  . MASTECTOMY      Social History:  reports that  she has never smoked. She has never used smokeless tobacco. She reports that she does not drink alcohol. No history on file for drug.  Family History:  Family History  Problem Relation Age of Onset  . Colon cancer Father   . Liver cancer Mother   . Asthma Father      Prior to Admission medications   Medication Sig Start Date End Date Taking? Authorizing Provider  ALPRAZolam Duanne Moron) 0.25 MG tablet Take 1.5mg  at bedtime as needed for  anxiety    [provider]  amLODipine (NORVASC) 5 MG tablet Take 5 mg by mouth every morning.     [provider]  aspirin EC 81 MG tablet Take 81 mg by mouth daily.    [provider]  Cholecalciferol (VITAMIN D) 2000 UNITS CAPS Take 2,000 Units by mouth daily.    [provider]  diphenhydramine-acetaminophen (TYLENOL PM) 25-500 MG TABS Take 1 tablet by mouth at bedtime as needed (for insomnia).    [provider]  donepezil (ARICEPT) 10 MG tablet Take 10 mg by mouth 2 (two) times daily.     [provider]  famotidine (PEPCID) 20 MG tablet Take 20 mg by mouth every evening.    [provider]  fluticasone (FLONASE) 50 MCG/ACT nasal spray Place 2 sprays into both nostrils daily. 04/22/15   Chesley Mires, MD  latanoprost (XALATAN) 0.005 % ophthalmic solution Place 1 drop into both eyes at bedtime.    [provider]  ondansetron (ZOFRAN ODT) 4 MG disintegrating tablet Take 1 tablet (4 mg total) by mouth every 8 (eight) hours as needed for nausea or vomiting. 09/20/15   Gareth Morgan, MD  oxybutynin (DITROPAN) 5 MG tablet Take 5 mg by mouth 3 (three) times daily.     [provider]  oxyCODONE-acetaminophen (PERCOCET/ROXICET) 5-325 MG tablet Take 1 tablet by mouth every 4 (four) hours as needed for severe pain. 09/20/15   Gareth Morgan, MD  pyridostigmine (MESTINON) 60 MG tablet Take 1.5 tablets by mouth twice a day    [provider]  simvastatin (ZOCOR) 40 MG tablet Take 40 mg by mouth every evening.    [provider]    Physical Exam: Vitals:   10/11/18 0306 10/11/18 0307 10/11/18 0330 10/11/18 0400  BP: (!) 134/55  (!) 138/47 (!) 135/55  Pulse: 74  78 80  Resp: 20 20 19 17   Temp:      TempSrc:      SpO2: 99%  99% 98%  Weight:      Height:       General: Not in acute distress.  Pale looking. HEENT:       Eyes: PERRL, EOMI, no scleral icterus.       ENT: No discharge from the ears and  nose, no pharynx injection, no tonsillar enlargement.        Neck: No JVD, no bruit, no mass felt. Heme: No neck lymph node enlargement. Cardiac: S1/S2, RRR, No murmurs, No gallops or rubs. Respiratory: No rales, wheezing, rhonchi or rubs. GI: Soft, nondistended, nontender, no rebound pain, no organomegaly, BS present. GU: No hematuria Ext: No pitting leg edema bilaterally. 2+DP/PT pulse bilaterally. Musculoskeletal: No joint deformities, No joint redness or warmth, no limitation of ROM in spin. Skin: No rashes.  Neuro: Alert, oriented X3, cranial nerves II-XII grossly intact, moves all extremities normally  Psych: Patient is not psychotic, no suicidal or hemocidal ideation.  Labs on Admission: I have personally reviewed following labs and  imaging studies  CBC: Recent Labs  Lab 10/11/18 0208  WBC 4.8  NEUTROABS 2.1  HGB 6.9*  HCT 25.3*  MCV 80.1  PLT 284   Basic Metabolic Panel: Recent Labs  Lab 10/11/18 0208  NA 135  K 3.8  CL 103  CO2 24  GLUCOSE 112*  BUN 12  CREATININE 0.77  CALCIUM 9.0   GFR: Estimated Creatinine Clearance: 38.7 mL/min (by C-G formula based on SCr of 0.77 mg/dL). Liver Function Tests: No results for input(s): AST, ALT, ALKPHOS, BILITOT, PROT, ALBUMIN in the last 168 hours. No results for input(s): LIPASE, AMYLASE in the last 168 hours. No results for input(s): AMMONIA in the last 168 hours. Coagulation Profile: No results for input(s): INR, PROTIME in the last 168 hours. Cardiac Enzymes: No results for input(s): CKTOTAL, CKMB, CKMBINDEX, TROPONINI in the last 168 hours. BNP (last 3 results) No results for input(s): PROBNP in the last 8760 hours. HbA1C: No results for input(s): HGBA1C in the last 72 hours. CBG: No results for input(s): GLUCAP in the last 168 hours. Lipid Profile: No results for input(s): CHOL, HDL, LDLCALC, TRIG, CHOLHDL, LDLDIRECT in the last 72 hours. Thyroid Function Tests: No results for input(s): TSH, T4TOTAL,  FREET4, T3FREE, THYROIDAB in the last 72 hours. Anemia Panel: No results for input(s): VITAMINB12, FOLATE, FERRITIN, TIBC, IRON, RETICCTPCT in the last 72 hours. Urine analysis:    Component Value Date/Time   COLORURINE STRAW (A) 10/11/2018 0208   APPEARANCEUR CLEAR 10/11/2018 0208   LABSPEC 1.006 10/11/2018 0208   PHURINE 6.0 10/11/2018 0208   GLUCOSEU NEGATIVE 10/11/2018 0208   HGBUR NEGATIVE 10/11/2018 0208   BILIRUBINUR NEGATIVE 10/11/2018 0208   KETONESUR NEGATIVE 10/11/2018 0208   PROTEINUR NEGATIVE 10/11/2018 0208   UROBILINOGEN 0.2 03/20/2013 0227   NITRITE NEGATIVE 10/11/2018 0208   LEUKOCYTESUR NEGATIVE 10/11/2018 0208   Sepsis Labs: @LABRCNTIP (procalcitonin:4,lacticidven:4) )No results found for this or any previous visit (from the past 240 hour(s)).   Radiological Exams on Admission: Ct Head Wo Contrast  Result Date: 10/11/2018 CLINICAL DATA:  85 year old with muscle weakness and confusion. EXAM: CT HEAD WITHOUT CONTRAST TECHNIQUE: Contiguous axial images were obtained from the base of the skull through the vertex without intravenous contrast. COMPARISON:  None. FINDINGS: Brain: Age related atrophy. Mild to moderate chronic small vessel ischemia. Remote right cerebellar infarct. No intracranial hemorrhage, mass effect, or midline shift. No hydrocephalus. The basilar cisterns are patent. No evidence of territorial infarct or acute ischemia. No extra-axial or intracranial fluid collection. Vascular: No hyperdense vessel. Skull: No fracture or focal lesion. Sinuses/Orbits: Paranasal sinuses and mastoid air cells are clear. The visualized orbits are unremarkable. Bilateral lens extraction. Other: None. IMPRESSION: 1. No acute intracranial abnormality. 2. Atrophy, chronic small vessel ischemia, and remote right cerebellar infarct. Electronically Signed   By: Keith Rake M.D.   On: 10/11/2018 02:40     EKG: Independently reviewed.  Sinus rhythm, QTC 419, LAD, poor R wave  progression  Assessment/Plan Principal Problem:   Symptomatic anemia Active Problems:   Myasthenia gravis (HCC)   Essential hypertension   GIB (gastrointestinal bleeding)   Symptomatic anemia possibly due to GI bleeding: Patient has been having intermittent dark stool for almost 1 month, indicating GI bleeding.  Patient was on aspirin and ibuprofen. She may have peptic ulcer. She stopped taking aspirin 1 week ago.  Now patient has generalized weakness.  Hemoglobin dropped from 10.6 to 6.9.  FOBT is negative today, indicating that her GI bleeding has stopped today.  Patient states that she never had EGD or colonoscopy in the past.  - will place in tele bed for obs - transfuse 2 units of blood now - hold off ASA and ibuprofen - NPO - IVF: 500 cc of NS bolus - Start IV pantoprazole gtt - Zofran IV for nausea - Avoid NSAIDs and SQ heparin - Maintain IV access (2 large bore IVs if possible). - Monitor closely and follow q6h cbc, transfuse as necessary, if Hgb<7.0 - LaB: INR, PTT and type screen - May need call GI in AM or f/u with GI  Myasthenia gravis [facility]): Patient has generalized weakness.  She also has difficulty speaking per her daughter.  On physical examination patient moves all extremities normally.  No facial droop, hearing loss or vision loss.  I have low suspicion for stroke.  Her weakness is most likely due to symptomatic anemia though we cannot completely rule out MG flareup.  Patient's pyridostigmine dose was increased recently. -will continue pyridostigmine -NIF/VC q8h -Observe closely.  If patient has a persistent worsening weakness after blood transfusion, may need to consider myasthenia gravis. -PT/OT  Essential hypertension: Not taking medications at home.  Blood pressure 141/75 -IV hydralazine as needed    DVT ppx: SCD Code Status: Full code Family Communication:   Yes, patient's daughter at bed side Disposition Plan:  Anticipate discharge back to previous home  environment Consults called:  none Admission status: Obs / tele     Date of Service 10/11/2018    [facility]   If 7PM-7AM, please contact night-coverage [email or web address] Password [facility] 10/11/2018, 4:44 AM

## 2018-10-12 ENCOUNTER — Encounter (HOSPITAL_COMMUNITY)
Admission: EM | Disposition: A | Discharge status: Home Health | Payer: Self-pay | Source: Home / Self Care | Attending: Emergency Medicine

## 2018-10-12 ENCOUNTER — Observation Stay (HOSPITAL_COMMUNITY): Payer: Medicare HMO | Admitting: Anesthesiology

## 2018-10-12 ENCOUNTER — Encounter (HOSPITAL_COMMUNITY): Payer: Self-pay | Admitting: *Deleted

## 2018-10-12 DIAGNOSIS — D649 Anemia, unspecified: Secondary | ICD-10-CM | POA: Diagnosis not present

## 2018-10-12 DIAGNOSIS — K921 Melena: Secondary | ICD-10-CM | POA: Diagnosis not present

## 2018-10-12 DIAGNOSIS — G7000 Myasthenia gravis without (acute) exacerbation: Secondary | ICD-10-CM | POA: Diagnosis not present

## 2018-10-12 DIAGNOSIS — I10 Essential (primary) hypertension: Secondary | ICD-10-CM | POA: Diagnosis not present

## 2018-10-12 HISTORY — PX: ESOPHAGOGASTRODUODENOSCOPY (EGD) WITH PROPOFOL: SHX5813

## 2018-10-12 LAB — CBC
HCT: 32.2 % — ABNORMAL LOW (ref 36.0–46.0)
HCT: 32.4 % — ABNORMAL LOW (ref 36.0–46.0)
Hemoglobin: 9.6 g/dL — ABNORMAL LOW (ref 12.0–15.0)
Hemoglobin: 9.7 g/dL — ABNORMAL LOW (ref 12.0–15.0)
MCH: 24.5 pg — AB (ref 26.0–34.0)
MCH: 24.5 pg — ABNORMAL LOW (ref 26.0–34.0)
MCHC: 29.8 g/dL — ABNORMAL LOW (ref 30.0–36.0)
MCHC: 29.9 g/dL — ABNORMAL LOW (ref 30.0–36.0)
MCV: 81.8 fL (ref 80.0–100.0)
MCV: 82.1 fL (ref 80.0–100.0)
PLATELETS: 274 10*3/uL (ref 150–400)
Platelets: 279 10*3/uL (ref 150–400)
RBC: 3.92 MIL/uL (ref 3.87–5.11)
RBC: 3.96 MIL/uL (ref 3.87–5.11)
RDW: 15.2 % (ref 11.5–15.5)
RDW: 15.2 % (ref 11.5–15.5)
WBC: 11.3 10*3/uL — ABNORMAL HIGH (ref 4.0–10.5)
WBC: 9.3 10*3/uL (ref 4.0–10.5)
nRBC: 0 % (ref 0.0–0.2)
nRBC: 0 % (ref 0.0–0.2)

## 2018-10-12 LAB — BASIC METABOLIC PANEL
Anion gap: 6 (ref 5–15)
BUN: 13 mg/dL (ref 8–23)
CHLORIDE: 107 mmol/L (ref 98–111)
CO2: 27 mmol/L (ref 22–32)
Calcium: 8.8 mg/dL — ABNORMAL LOW (ref 8.9–10.3)
Creatinine, Ser: 0.81 mg/dL (ref 0.44–1.00)
GFR calc Af Amer: >60 mL/min (ref >60)
GFR calc non Af Amer: >60 mL/min (ref >60)
Glucose, Bld: 99 mg/dL (ref 70–99)
Potassium: 3.8 mmol/L (ref 3.5–5.1)
Sodium: 140 mmol/L (ref 135–145)

## 2018-10-12 SURGERY — ESOPHAGOGASTRODUODENOSCOPY (EGD) WITH PROPOFOL
Anesthesia: Monitor Anesthesia Care

## 2018-10-12 MED ORDER — PANTOPRAZOLE SODIUM 40 MG PO TBEC: 40.0000 mg | DELAYED_RELEASE_TABLET | Freq: Every day | ORAL | 0 refills | Status: DC

## 2018-10-12 MED ORDER — PROPOFOL 10 MG/ML IV BOLUS
INTRAVENOUS | Status: DC | PRN
  Administered 2018-10-12: 10 mg via INTRAVENOUS
  Administered 2018-10-12: 20 mg via INTRAVENOUS
  Administered 2018-10-12: 10 mg via INTRAVENOUS

## 2018-10-12 MED ORDER — LACTATED RINGERS IV SOLN
INTRAVENOUS | Status: DC | PRN
  Administered 2018-10-12: 10:00:00 via INTRAVENOUS

## 2018-10-12 MED ORDER — PROPOFOL 500 MG/50ML IV EMUL
INTRAVENOUS | Status: DC | PRN
  Administered 2018-10-12: 50 ug/kg/min via INTRAVENOUS

## 2018-10-12 MED ORDER — PROPOFOL 10 MG/ML IV BOLUS
INTRAVENOUS | Status: AC
  Filled 2018-10-12: qty 40

## 2018-10-12 MED ORDER — PANTOPRAZOLE SODIUM 40 MG PO TBEC
40.0000 mg | DELAYED_RELEASE_TABLET | Freq: Every day | ORAL | Status: DC
  Administered 2018-10-12: 40 mg via ORAL
  Filled 2018-10-12: qty 1

## 2018-10-12 SURGICAL SUPPLY — 15 items

## 2018-10-12 NOTE — Anesthesia Postprocedure Evaluation (Signed)
Anesthesia Post Note  Patient: Elaine Owens  Procedure(s) Performed: ESOPHAGOGASTRODUODENOSCOPY (EGD) WITH PROPOFOL (N/A )     Patient location during evaluation: PACU Anesthesia Type: MAC Level of consciousness: awake and alert Pain management: pain level controlled Vital Signs Assessment: post-procedure vital signs reviewed and stable Respiratory status: spontaneous breathing, nonlabored ventilation, respiratory function stable and patient connected to nasal cannula oxygen Cardiovascular status: stable and blood pressure returned to baseline Postop Assessment: no apparent nausea or vomiting Anesthetic complications: no    Last Vitals:  Vitals:   10/12/18 1025 10/12/18 1030  BP: (!) 113/36 107/71  Pulse: 81 77  Resp: (!) 21 13  Temp:    SpO2: 96% 97%    Last Pain:  Vitals:   10/12/18 1030  TempSrc:   PainSc: 0-No pain                 Alieyah Spader P Matisse Roskelley

## 2018-10-12 NOTE — Evaluation (Signed)
Physical Therapy Evaluation Patient Details Name: Elaine Owens MRN: [id number] DOB: November 24, 1925 Today's Date: 10/12/2018   History of Present Illness  Pt is a 85 y.o. female with past medical history of hypertension, hyperlipidemia, myasthenia gravis, pernicious anemia, and remote history of breast cancer presented to the hospital with generalized weakness and dark stool and admitted for symptomatic normocytic anemia suspect secondary to upper GI bleeding  Clinical Impression  Patient evaluated by Physical Therapy with no further acute PT needs identified. All education has been completed and the patient has no further questions.  Pt up in recliner on arrival.  Pt ambulated 200 feet around unit with RW and appears steady, also denies any symptoms.  Pt encouraged to use RW initially at home upon d/c for safety.  Pt hopeful for d/c home today. See below for any follow-up Physical Therapy or equipment needs. PT is signing off. Thank you for this referral.   Follow Up Recommendations No PT follow up    Equipment Recommendations  None recommended by PT    Recommendations for Other Services       Precautions / Restrictions Precautions Precautions: Fall Restrictions Weight Bearing Restrictions: No      Mobility  Bed Mobility Overal bed mobility: Modified Independent                Transfers Overall transfer level: Needs assistance Equipment used: Rolling walker (2 wheeled) Transfers: Sit to/from Stand Sit to Stand: Supervision Stand pivot transfers: Supervision       General transfer comment: supervision for safety  Ambulation/Gait Ambulation/Gait assistance: Supervision;Min guard Gait Distance (Feet): 200 Feet Assistive device: Rolling walker (2 wheeled) Gait Pattern/deviations: Step-through pattern;Decreased stride length     General Gait Details: pt denies any symptoms, steady with RW  Stairs            Wheelchair Mobility    Modified Rankin (Stroke  Patients Only)       Balance Overall balance assessment: Needs assistance(denies any falls)         Standing balance support: No upper extremity supported Standing balance-Leahy Scale: Fair                               Pertinent Vitals/Pain Pain Assessment: No/denies pain    Home Living Family/patient expects to be discharged to:: Private residence Living Arrangements: Children Available Help at Discharge: Available 24 hours/day Type of Home: House Home Access: Stairs to enter   [employer] of Steps: 1(1) Home Layout: Two level;Full bath on main level;Able to live on main level with bedroom/bathroom Home Equipment: Gilford Rile - 2 wheels;Cane - single point;Shower seat      Prior Function Level of Independence: Independent with assistive device(s)               Hand Dominance   Dominant Hand: Right    Extremity/Trunk Assessment   Upper Extremity Assessment Upper Extremity Assessment: Generalized weakness    Lower Extremity Assessment Lower Extremity Assessment: Generalized weakness       Communication   Communication: No difficulties  Cognition Arousal/Alertness: Awake/alert Behavior During Therapy: WFL for tasks assessed/performed Overall Cognitive Status: Within Functional Limits for tasks assessed                                        General Comments      Exercises  Assessment/Plan    PT Assessment Patent does not need any further PT services  PT Problem List         PT Treatment Interventions      PT Goals (Current goals can be found in the Care Plan section)  Acute Rehab PT Goals Patient Stated Goal: (GO HOME) PT Goal Formulation: All assessment and education complete, DC therapy    Frequency     Barriers to discharge        Co-evaluation               AM-PAC PT "6 Clicks" Mobility  Outcome Measure Help needed turning from your back to your side while in a flat bed without  using bedrails?: None Help needed moving from lying on your back to sitting on the side of a flat bed without using bedrails?: None Help needed moving to and from a bed to a chair (including a wheelchair)?: A Little Help needed standing up from a chair using your arms (e.g., wheelchair or bedside chair)?: A Little Help needed to walk in hospital room?: A Little Help needed climbing 3-5 steps with a railing? : A Little 6 Click Score: 20    End of Session Equipment Utilized During Treatment: Gait belt Activity Tolerance: Patient tolerated treatment well Patient left: in bed;with call bell/phone within reach;with family/visitor present Nurse Communication: Mobility status PT Visit Diagnosis: Difficulty in walking, not elsewhere classified (R26.2)    Time: 4709-6283 PT Time Calculation (min) (ACUTE ONLY): 12 min   Charges:   PT Evaluation $PT Eval Low Complexity: [place], PT, DPT Acute Rehabilitation Services Office: [phone] Pager: [phone]  Trena Platt 10/12/2018, 2:54 PM

## 2018-10-12 NOTE — Evaluation (Signed)
Occupational Therapy Evaluation Patient Details Name: Elaine Owens MRN: [id number] DOB: 22-Jan-1926 Today's Date: 10/12/2018    History of Present Illness Elaine Owens is a 85 y.o. female with past medical history of hypertension, hyperlipidemia, history of myasthenia gravis, history of pernicious anemia, and remote history of breast cancer presented to the hospital with generalized weakness and dark stools.  Upon initial evaluation in the emergency room, she was found to have hemoglobin of 6.9.  Her hemoglobin was around 10.6 in October 2019.  FOBT was negative.  GI is consulted for further evaluation.   Clinical Impression   PATIENT WAS S WITH TRANSFERS AND AMB WITH CUES FOR PROPER HAND PLACEMENT WITH WALKER. PATIENT DTR STATES SHE DOES NOT USUALLY USE A WALKER AND JUST WALKS SLOWLY AROUND THE HOUSE. PATIENT WAS S TO MIN GUARD ASSIST WITH ADLS. PATIENT STATES SHE IS A LITTLE WEAKER THAT SHE IS AT HOME. PATIENT HAS NEEDED DME AND HAS 24 HOUR CARE. PATIENT WOULD BENEFIT FROM HHOT. TO ASSESS FOR NEEDS AT HOME. PATIENT IS GOING TO D/C HOME TODAY PER NURSE SO NO OTHER ACUTE VISITS.     Follow Up Recommendations       Equipment Recommendations       Recommendations for Other Services       Precautions / Restrictions Precautions Precautions: Fall Restrictions Weight Bearing Restrictions: No      Mobility Bed Mobility Overal bed mobility: Modified Independent                Transfers Overall transfer level: Needs assistance Equipment used: Rolling walker (2 wheeled) Transfers: Stand Pivot Transfers   Stand pivot transfers: Supervision            Balance                                           ADL either performed or assessed with clinical judgement   ADL Overall ADL's : Needs assistance/impaired Eating/Feeding: Independent   Grooming: Wash/dry hands;Wash/dry face;Supervision/safety;Standing   Upper Body Bathing: Supervision/  safety;Sitting   Lower Body Bathing: Min guard;Sit to/from stand   Upper Body Dressing : Supervision/safety;Set up;Sitting   Lower Body Dressing: Min guard;Sit to/from stand   Toilet Transfer: Supervision/safety;Cueing for safety;Ambulation   Toileting- Clothing Manipulation and Hygiene: Min guard;Sit to/from stand       Functional mobility during ADLs: Supervision/safety;Cueing for safety General ADL Comments: PATIENT WAS S TO MIN GUARD ASSIST FOR ADLS. [place].      Vision Baseline Vision/History: Wears glasses;Glaucoma Wears Glasses: Reading only Patient Visual Report: No change from baseline       Perception     Praxis      Pertinent Vitals/Pain Pain Assessment: No/denies pain     Hand Dominance Right   Extremity/Trunk Assessment Upper Extremity Assessment Upper Extremity Assessment: Generalized weakness           Communication Communication Communication: No difficulties   Cognition Arousal/Alertness: Awake/alert Behavior During Therapy: WFL for tasks assessed/performed Overall Cognitive Status: Within Functional Limits for tasks assessed                                     General Comments       Exercises     Shoulder Instructions  Home Living Family/patient expects to be discharged to:: Private residence Living Arrangements: Children Available Help at Discharge: Available 24 hours/day Type of Home: House Home Access: Stairs to enter [employer] of Steps: (1)   Home Layout: Two level;Full bath on main level;Able to live on main level with bedroom/bathroom     Bathroom Shower/Tub: Teacher, early years/pre: Standard     Home Equipment: Environmental consultant - 2 wheels;Cane - single point;Shower seat          Prior Functioning/Environment Level of Independence: Independent with assistive device(s)                 OT Problem List:        OT Treatment/Interventions:       OT Goals(Current goals can be found in the care plan section) Acute Rehab OT Goals Patient Stated Goal: (GO HOME)  OT Frequency:     Barriers to D/C:            Co-evaluation              AM-PAC OT "6 Clicks" Daily Activity     Outcome Measure Help from another person eating meals?: None Help from another person taking care of personal grooming?: A Little Help from another person toileting, which includes using toliet, bedpan, or urinal?: A Little Help from another person bathing (including washing, rinsing, drying)?: A Little Help from another person to put on and taking off regular upper body clothing?: A Little Help from another person to put on and taking off regular lower body clothing?: A Little 6 Click Score: 19   End of Session Equipment Utilized During Treatment: Gait belt;Rolling walker Nurse Communication: (TOLD HER THAT PATIENT DID NOT HAVE A CHAIR ALARM )  Activity Tolerance: Patient tolerated treatment well Patient left: in chair;with call bell/phone within reach;with family/visitor present                   Time: 2505-3976 OT Time Calculation (min): 40 min Charges:  OT General Charges $OT Visit: 1 Visit OT Evaluation $OT Eval Moderate Complexity: 1 Mod  6 CLICKS  Elaine Owens 10/12/2018, 12:34 PM

## 2018-10-12 NOTE — Op Note (Signed)
[facility] Patient Name: Elaine Owens Procedure Date: 10/12/2018 MRN: [id number] Attending MD: Otis Brace , MD Date of Birth: [birth year] CSN: [id number] Age: 85 Admit Type: Inpatient Procedure:                Upper GI endoscopy Indications:              Melena Providers:                Otis Brace, MD, Carlyn Reichert, RN, Charolette Child, Technician Referring MD:              Medicines:                Sedation Administered by an Anesthesia Professional Complications:            No immediate complications. Estimated Blood Loss:     Estimated blood loss was minimal. Procedure:                Pre-Anesthesia Assessment:                           - Prior to the procedure, a History and Physical                            was performed, and patient medications and                            allergies were reviewed. The patient's tolerance of                            previous anesthesia was also reviewed. The risks                            and benefits of the procedure and the sedation                            options and risks were discussed with the patient.                            All questions were answered, and informed consent                            was obtained. Prior Anticoagulants: The patient has                            taken aspirin, last dose was 7 days prior to                            procedure. ASA Grade Assessment: III - A patient                            with severe systemic disease. After reviewing the  risks and benefits, the patient was deemed in                            satisfactory condition to undergo the procedure.                           After obtaining informed consent, the endoscope was                            passed under direct vision. Throughout the                            procedure, the patient's blood pressure, pulse, and   oxygen saturations were monitored continuously. The                            GIF-H190 ([id number]) Olympus gastroscope was                            introduced through the mouth, and advanced to the                            third part of duodenum. The upper GI endoscopy was                            accomplished without difficulty. The patient                            tolerated the procedure well. Scope In: Scope Out: Findings:      A non-obstructing Schatzki ring was found at the gastroesophageal       junction.      A 7 cm hiatal hernia was present.      Scattered mild inflammation characterized by erythema was found in the       gastric antrum.      The cardia and gastric fundus were normal on retroflexion.      There is no endoscopic evidence of bleeding or ulceration in the entire       examined stomach.      The duodenal bulb, first portion of the duodenum, second portion of the       duodenum and third portion of the duodenum were normal. Impression:               - Non-obstructing Schatzki ring.                           - 7 cm hiatal hernia.                           - Gastritis.                           - Normal duodenal bulb, first portion of the                            duodenum, second portion of the duodenum and third  portion of the duodenum.                           - No specimens collected. Moderate Sedation:      Moderate (conscious) sedation was personally administered by an       anesthesia professional. The following parameters were monitored: oxygen       saturation, heart rate, blood pressure, and response to care. Recommendation:           - Return patient to hospital ward for ongoing care.                           - Cardiac diet.                           - Continue present medications.                           - Return to GI clinic PRN. Procedure Code(s):        --- Professional ---                           [phone],  Esophagogastroduodenoscopy, flexible,                            transoral; diagnostic, including collection of                            specimen(s) by brushing or washing, when performed                            (separate procedure) Diagnosis Code(s):        --- Professional ---                           K22.2, Esophageal obstruction                           K44.9, Diaphragmatic hernia without obstruction or                            gangrene                           K29.70, Gastritis, unspecified, without bleeding                           K92.1, Melena (includes Hematochezia) CPT copyright 2018 American Medical Association. All rights reserved. The codes documented in this report are preliminary and upon coder review may  be revised to meet current compliance requirements. Otis Brace, MD Otis Brace, MD 10/12/2018 10:20:41 AM Number of Addenda: 0

## 2018-10-12 NOTE — Anesthesia Preprocedure Evaluation (Addendum)
Anesthesia Evaluation  Patient identified by MRN, date of birth, ID band Patient awake    Reviewed: Allergy & Precautions, NPO status , Patient's Chart, lab work & pertinent test results  Airway Mallampati: II  TM Distance: >3 FB Neck ROM: Full    Dental  (+) Upper Dentures, Lower Dentures   Pulmonary neg pulmonary ROS,    Pulmonary exam normal breath sounds clear to auscultation       Cardiovascular hypertension, Normal cardiovascular exam Rhythm:Regular Rate:Normal  ECG: rate 86. Sinus rhythm Left anterior fascicular block   Neuro/Psych Myasthenia gravis   Neuromuscular disease negative psych ROS   GI/Hepatic negative GI ROS, Neg liver ROS,   Endo/Other  Immune deficiency disorder   Renal/GU negative Renal ROS     Musculoskeletal negative musculoskeletal ROS (+)   Abdominal   Peds  Hematology  (+) anemia ,   Anesthesia Other Findings Melena  Reproductive/Obstetrics                            Anesthesia Physical Anesthesia Plan  ASA: III  Anesthesia Plan: MAC   Post-op Pain Management:    Induction:   PONV Risk Score and Plan: 2 and Propofol infusion and Treatment may vary due to age or medical condition  Airway Management Planned: Nasal Cannula  Additional Equipment:   Intra-op Plan:   Post-operative Plan:   Informed Consent: I have reviewed the patients History and Physical, chart, labs and discussed the procedure including the risks, benefits and alternatives for the proposed anesthesia with the patient or authorized representative who has indicated his/her understanding and acceptance.       Plan Discussed with: CRNA  Anesthesia Plan Comments:        Anesthesia Quick Evaluation

## 2018-10-12 NOTE — Transfer of Care (Signed)
Immediate Anesthesia Transfer of Care Note  Patient: Elaine Owens  Procedure(s) Performed: Procedure(s): ESOPHAGOGASTRODUODENOSCOPY (EGD) WITH PROPOFOL (N/A)  Patient Location: PACU  Anesthesia Type:MAC  Level of Consciousness:  sedated, patient cooperative and responds to stimulation  Airway & Oxygen Therapy:Patient Spontanous Breathing and Patient connected to face mask oxgen  Post-op Assessment:  Report given to PACU RN and Post -op Vital signs reviewed and stable  Post vital signs:  Reviewed and stable  Last Vitals:  Vitals:   10/12/18 0557 10/12/18 0953  BP: 122/77 (!) 159/56  Pulse: 69   Resp: 14 (!) 21  Temp: 37.1 C 36.7 C  SpO2: 25% 85%    Complications: No apparent anesthesia complications

## 2018-10-12 NOTE — Progress Notes (Signed)
NIF 30/FVC 0.9L

## 2018-10-12 NOTE — Discharge Summary (Signed)
Discharge Summary        Discharge Summary  Elaine Owens DQQ:[id number] DOB: 1926/04/22  PCP: Orvis Brill, PA-C  Admit date: 10/11/2018 Discharge date: 10/12/2018  Time spent: 35 minutes  Recommendations for Outpatient Follow-up:  1. Follow-up with your primary care provider 2. Follow-up with GI as needed 3. Take your medications as prescribed  Discharge Diagnoses:  Active Hospital Problems   Diagnosis Date Noted  . Symptomatic anemia 10/11/2018  . GIB (gastrointestinal bleeding) 10/11/2018  . Essential hypertension 07/15/2013  . Myasthenia gravis ([place]) 03/20/2013    Resolved Hospital Problems  No resolved problems to display.    Discharge Condition: Stable  Diet recommendation: Resume previous diet  Vitals:   10/12/18 1025 10/12/18 1030  BP: (!) 113/36 107/71  Pulse: 81 77  Resp: (!) 21 13  Temp:    SpO2: 96% 97%    History of present illness:  Elaine Luton Collinsis a 85 y.o.femalewith medical history significant ofhypertension, hyperlipidemia, remote breast cancer (left mastectomy, tamoxifen treatment completed),myasthenia gravis, anemia, GI bleeding, dementia, anxiety, who presents from home with dark stool, generalized weakness.  Patient states that she has been having intermittent dark stool for almost a month. No nausea,vomiting, diarrhea or abdominal pain. She stopped taking aspirin 1 week ago, but still takes ibuprofen intermittently for pain.   10/11/2018: Patient seen and examined with her daughter Elaine Owens at bedside.  She has no new complaints however she appears very weak.  She is receiving blood 2 unit PRBCs.  GI has been consulted.   10/12/2018: Seen and examined at her bedside.  No acute events overnight.  She has no new complaints.  Denies melena or hematochezia.  Hemoglobin is stable at 9.7.  No new complaints.  EGD done today revealed medium to large hiatal hernia and mild gastritis.  No evidence of active bleeding.  GI  recommended continuing PPI once a day and to avoid NSAIDs.  Okay to discharge home from GI standpoint.  On the day of discharge, the patient was hemodynamically stable.  She will need to follow-up with her primary care provider and GI as needed.  Hospital Course:  Principal Problem:   Symptomatic anemia Active Problems:   Myasthenia gravis (H[facility])   Essential hypertension   GIB (gastrointestinal bleeding)  Symptomatic normocytic anemia suspect secondary to upper GI bleeding: Patient has been having intermittent dark stool for almost 1 month, indicating GI bleeding.  Patient was on aspirin and ibuprofen. She may have peptic ulcer. She stopped taking aspirin 1 week ago.  Now patient has generalized weakness.  Hemoglobin dropped from 10.6 to 6.9.  FOBT is negative, indicating that her GI bleeding has stopped today.  Patient states that she never had EGD or colonoscopy in the past. Transfused 2 unit PRBCs Hemoglobin stable at 9.7 No sign of overt bleeding States she feels better after blood transfusion EGD done on 10/12/2018 revealed medium to large hiatal hernia and mild gastritis.  With no evidence of active bleeding.  GI recommended PPI once a day and to avoid NSAIDs. May follow-up with GI as needed  Myasthenia gravis [facility]): On physical examination patient moves all extremities normally.  No facial droop, hearing loss or vision loss.  I have low suspicion for stroke.  Her weakness is most likely due to symptomatic anemia which is resolving. Patient's pyridostigmine dose was increased recently. -will continue pyridostigmine -NIF/VC q8h -OT recommends home OT  Essential hypertension: Not taking medications at home.  Pressure stable Follow-up with your primary care  doctor  Code Status: Full code     Procedures:  EGD on 10/12/2018  Consultations:  GI  Discharge Exam: BP 107/71   Pulse 77   Temp 98.4 F (36.9 C) (Oral)   Resp 13   Ht 5\' 2"  (1.575 m)   Wt 58.5 kg   SpO2 97%    BMI 23.58 kg/m  . General: 85 y.o. year-old female well developed well nourished in no acute distress.  Alert and interactive. . Cardiovascular: Regular rate and rhythm with no rubs or gallops.  No thyromegaly or JVD noted.   Marland Kitchen Respiratory: Clear to auscultation with no wheezes or rales. Good inspiratory effort. . Abdomen: Soft nontender nondistended with normal bowel sounds x4 quadrants. . Musculoskeletal: Trace lower extremity edema. 2/4 pulses in all 4 extremities. Marland Kitchen Psychiatry: Mood is appropriate for condition and setting  Discharge Instructions You were cared for by a hospitalist during your hospital stay. If you have any questions about your discharge medications or the care you received while you were in the hospital after you are discharged, you can call the unit and asked to speak with the hospitalist on call if the hospitalist that took care of you is not available. Once you are discharged, your primary care physician will handle any further medical issues. Please note that NO REFILLS for any discharge medications will be authorized once you are discharged, as it is imperative that you return to your primary care physician (or establish a relationship with a primary care physician if you do not have one) for your aftercare needs so that they can reassess your need for medications and monitor your lab values.   Allergies as of 10/12/2018      Reactions   Ciprofloxacin Other (See Comments)   Can not take if patient has myastenia gravis   Doxycycline Hyclate Other (See Comments)   Patient preference   Morphine Nausea And Vomiting   Morphine And Related Nausea And Vomiting   Mad her extremely sick      Medication List    STOP taking these medications   ibuprofen 200 MG tablet Commonly known as:  ADVIL,MOTRIN   ondansetron 4 MG disintegrating tablet Commonly known as:  ZOFRAN ODT   oxyCODONE-acetaminophen 5-325 MG tablet Commonly known as:  PERCOCET/ROXICET     TAKE these  medications   ACETAMINOPHEN 8 HOUR 650 MG CR tablet Generic drug:  acetaminophen Take 1,300 mg by mouth 2 (two) times daily.   Calcium-Vitamin D 600-125 MG-UNIT Tabs Take 2 tablets by mouth daily.   fluticasone 50 MCG/ACT nasal spray Commonly known as:  FLONASE Place 2 sprays into both nostrils daily.   pantoprazole 40 MG tablet Commonly known as:  PROTONIX Take 1 tablet (40 mg total) by mouth daily. Start taking on:  October 13, 2018   pyridostigmine 60 MG tablet Commonly known as:  MESTINON Take 60 mg by mouth 4 (four) times daily.   vitamin B-12 1000 MCG tablet Commonly known as:  CYANOCOBALAMIN Take 1,000 mcg by mouth daily.   vitamin C 1000 MG tablet Take 1,000 mg by mouth daily.      Allergies  Allergen Reactions  . Ciprofloxacin Other (See Comments)    Can not take if patient has myastenia gravis  . Doxycycline Hyclate Other (See Comments)    Patient preference  . Morphine Nausea And Vomiting  . Morphine And Related Nausea And Vomiting    Mad her extremely sick   Follow-up Information    [place]ok-Dillow, Lezlie Octave,  PA-C. Call in 1 day(s).   Specialty:  Physician Assistant Why:  Please call to make an appointment for post hospital follow-up appointment. Contact information: [place] Name [place] [id number] [phone]            The results of significant diagnostics from this hospitalization (including imaging, microbiology, ancillary and laboratory) are listed below for reference.    Significant Diagnostic Studies: Ct Head Wo Contrast  Result Date: 10/11/2018 CLINICAL DATA:  85 year old with muscle weakness and confusion. EXAM: CT HEAD WITHOUT CONTRAST TECHNIQUE: Contiguous axial images were obtained from the base of the skull through the vertex without intravenous contrast. COMPARISON:  None. FINDINGS: Brain: Age related atrophy. Mild to moderate chronic small vessel ischemia. Remote right cerebellar infarct. No intracranial  hemorrhage, mass effect, or midline shift. No hydrocephalus. The basilar cisterns are patent. No evidence of territorial infarct or acute ischemia. No extra-axial or intracranial fluid collection. Vascular: No hyperdense vessel. Skull: No fracture or focal lesion. Sinuses/Orbits: Paranasal sinuses and mastoid air cells are clear. The visualized orbits are unremarkable. Bilateral lens extraction. Other: None. IMPRESSION: 1. No acute intracranial abnormality. 2. Atrophy, chronic small vessel ischemia, and remote right cerebellar infarct. Electronically Signed   By: Keith Rake M.D.   On: 10/11/2018 02:40    Microbiology: No results found for this or any previous visit (from the past 240 hour(s)).   Labs: Basic Metabolic Panel: Recent Labs  Lab 10/11/18 0208 10/11/18 0802 10/12/18 0354  NA 135 140 140  K 3.8 3.9 3.8  CL 103 108 107  CO2 24 23 27   GLUCOSE 112* 99 99  BUN 12 9 13   CREATININE 0.77 0.64 0.81  CALCIUM 9.0 9.1 8.8*   Liver Function Tests: No results for input(s): AST, ALT, ALKPHOS, BILITOT, PROT, ALBUMIN in the last 168 hours. No results for input(s): LIPASE, AMYLASE in the last 168 hours. No results for input(s): AMMONIA in the last 168 hours. CBC: Recent Labs  Lab 10/11/18 0208 10/11/18 1848 10/11/18 2347 10/12/18 0354  WBC 4.8 13.2* 11.3* 9.3  NEUTROABS 2.1  --   --   --   HGB 6.9* 10.3* 9.6* 9.7*  HCT 25.3* 35.0* 32.2* 32.4*  MCV 80.1 82.4 82.1 81.8  PLT 338 294 274 279   Cardiac Enzymes: No results for input(s): CKTOTAL, CKMB, CKMBINDEX, TROPONINI in the last 168 hours. BNP: BNP (last 3 results) No results for input(s): BNP in the last 8760 hours.  ProBNP (last 3 results) No results for input(s): PROBNP in the last 8760 hours.  CBG: No results for input(s): GLUCAP in the last 168 hours.     Signed:  Kayleen Memos, MD [facility] 10/12/2018, 2:02 PM

## 2018-10-12 NOTE — Interval H&P Note (Signed)
History and Physical Interval Note:  10/12/2018 9:59 AM  Ma Hillock  has presented today for surgery, with the diagnosis of Melena  The various methods of treatment have been discussed with the patient and family. After consideration of risks, benefits and other options for treatment, the patient has consented to  Procedure(s): ESOPHAGOGASTRODUODENOSCOPY (EGD) WITH PROPOFOL (N/A) as a surgical intervention .  The patient's history has been reviewed, patient examined, no change in status, stable for surgery.  I have reviewed the patient's chart and labs.  Questions were answered to the patient's satisfaction.    Risks (bleeding, infection, bowel perforation that could require surgery, sedation-related changes in cardiopulmonary systems), benefits (identification and possible treatment of source of symptoms, exclusion of certain causes of symptoms), and alternatives (watchful waiting, radiographic imaging studies, empiric medical treatment)  were explained to patient/family in detail and patient wishes to proceed.   Niccolas Loeper

## 2018-10-12 NOTE — Brief Op Note (Signed)
10/11/2018 - 10/12/2018  10:14 AM  PATIENT:  Elaine Owens  85 y.o. female  PRE-OPERATIVE DIAGNOSIS:  Melena  POST-OPERATIVE DIAGNOSIS:  hiatal hernia, gastritis  PROCEDURE:  Procedure(s): ESOPHAGOGASTRODUODENOSCOPY (EGD) WITH PROPOFOL (N/A)  SURGEON:  Surgeon(s) and Role:    * Ranulfo Kall, MD - Primary  Findings ------------ -EGD showed medium to large hiatal hernia and mild gastritis.  No evidence of active bleeding.  Recommendations -------------------------- -Change PPI to once a day.  Continue once a day PPI. -Avoid NSAIDs - advance  diet as tolerated. -Okay to discharge from GI standpoint.  GI will sign off.  Call us back if needed  Otis Brace MD, [place] 10/12/2018, 10:16 AM  Contact #  [phone]

## 2018-10-12 NOTE — Progress Notes (Signed)
NIF -22 VC 1.2L   Pt had good effort.

## 2018-10-12 NOTE — Progress Notes (Signed)
SLP Cancellation Note  Patient Details Name: Elaine Owens MRN: [id number] DOB: 1926-08-26   Cancelled treatment:       Reason Eval/Treat Not Completed: Medical issues which prohibited therapy - orders received for swallow evaluation. Per chart review, pt is currently NPO pending EGD planned for today. Will f/u as able.   Elaine Owens 10/12/2018, 8:11 AM  Germain Osgood Jiselle Sheu, M.A. [place] [employer] education officer [phone] Office [phone]

## 2018-10-13 ENCOUNTER — Encounter (HOSPITAL_COMMUNITY): Payer: Self-pay | Admitting: Gastroenterology

## 2018-10-13 LAB — BPAM RBC
BLOOD PRODUCT EXPIRATION DATE: 202002222359
Blood Product Expiration Date: 202002222359
ISSUE DATE / TIME: 202001250814
ISSUE DATE / TIME: 202001251148
Unit Type and Rh: 6200
Unit Type and Rh: 6200

## 2018-10-13 LAB — TYPE AND SCREEN
ABO/RH(D): A POS
Antibody Screen: NEGATIVE
UNIT DIVISION: 0
Unit division: 0

## 2018-10-13 NOTE — Progress Notes (Addendum)
10/13/2018 Pt dc on 10/12/2018, and needed HH. Attempted call to pt's home and left message for return call. Jonnie Finner RN CCM Case Mgmt phone [phone]  10/17/2018 NCM spoke to pt and offered choice pt. Agreeable to [facility] for [facility]. Contacted [facility] rep with new referral. Jonnie Finner RN CCM Case Mgmt phone [phone]

## 2019-08-02 ENCOUNTER — Other Ambulatory Visit: Payer: Self-pay

## 2019-08-02 ENCOUNTER — Emergency Department (HOSPITAL_COMMUNITY): Payer: Medicare HMO

## 2019-08-02 ENCOUNTER — Inpatient Hospital Stay (HOSPITAL_COMMUNITY)
Admission: EM | Admit: 2019-08-02 | Discharge: 2019-08-07 | DRG: 057 | Disposition: A | Discharge status: Home Health | Payer: Medicare HMO | Attending: Internal Medicine | Admitting: Internal Medicine

## 2019-08-02 ENCOUNTER — Emergency Department (HOSPITAL_COMMUNITY)
Admission: EM | Admit: 2019-08-02 | Discharge: 2019-08-02 | Disposition: A | Discharge status: Home / Self Care | Payer: Medicare HMO | Source: Home / Self Care | Attending: Emergency Medicine | Admitting: Emergency Medicine

## 2019-08-02 ENCOUNTER — Encounter (HOSPITAL_COMMUNITY): Payer: Self-pay | Admitting: Emergency Medicine

## 2019-08-02 ENCOUNTER — Encounter (HOSPITAL_COMMUNITY): Payer: Self-pay

## 2019-08-02 DIAGNOSIS — Z853 Personal history of malignant neoplasm of breast: Secondary | ICD-10-CM | POA: Insufficient documentation

## 2019-08-02 DIAGNOSIS — Z66 Do not resuscitate: Secondary | ICD-10-CM | POA: Diagnosis present

## 2019-08-02 DIAGNOSIS — H9190 Unspecified hearing loss, unspecified ear: Secondary | ICD-10-CM | POA: Diagnosis present

## 2019-08-02 DIAGNOSIS — Z9049 Acquired absence of other specified parts of digestive tract: Secondary | ICD-10-CM

## 2019-08-02 DIAGNOSIS — Z9012 Acquired absence of left breast and nipple: Secondary | ICD-10-CM | POA: Diagnosis not present

## 2019-08-02 DIAGNOSIS — Z79899 Other long term (current) drug therapy: Secondary | ICD-10-CM

## 2019-08-02 DIAGNOSIS — Z20828 Contact with and (suspected) exposure to other viral communicable diseases: Secondary | ICD-10-CM | POA: Insufficient documentation

## 2019-08-02 DIAGNOSIS — R410 Disorientation, unspecified: Secondary | ICD-10-CM | POA: Diagnosis not present

## 2019-08-02 DIAGNOSIS — Z8673 Personal history of transient ischemic attack (TIA), and cerebral infarction without residual deficits: Secondary | ICD-10-CM | POA: Diagnosis not present

## 2019-08-02 DIAGNOSIS — I444 Left anterior fascicular block: Secondary | ICD-10-CM | POA: Diagnosis present

## 2019-08-02 DIAGNOSIS — T380X5A Adverse effect of glucocorticoids and synthetic analogues, initial encounter: Secondary | ICD-10-CM | POA: Diagnosis not present

## 2019-08-02 DIAGNOSIS — R479 Unspecified speech disturbances: Secondary | ICD-10-CM | POA: Diagnosis not present

## 2019-08-02 DIAGNOSIS — G7001 Myasthenia gravis with (acute) exacerbation: Secondary | ICD-10-CM | POA: Diagnosis not present

## 2019-08-02 DIAGNOSIS — Z881 Allergy status to other antibiotic agents status: Secondary | ICD-10-CM

## 2019-08-02 DIAGNOSIS — I10 Essential (primary) hypertension: Secondary | ICD-10-CM | POA: Insufficient documentation

## 2019-08-02 DIAGNOSIS — Z885 Allergy status to narcotic agent status: Secondary | ICD-10-CM | POA: Diagnosis not present

## 2019-08-02 DIAGNOSIS — R1310 Dysphagia, unspecified: Secondary | ICD-10-CM

## 2019-08-02 DIAGNOSIS — F0390 Unspecified dementia without behavioral disturbance: Secondary | ICD-10-CM | POA: Insufficient documentation

## 2019-08-02 DIAGNOSIS — Z9010 Acquired absence of unspecified breast and nipple: Secondary | ICD-10-CM | POA: Diagnosis not present

## 2019-08-02 DIAGNOSIS — Z90710 Acquired absence of both cervix and uterus: Secondary | ICD-10-CM | POA: Diagnosis not present

## 2019-08-02 DIAGNOSIS — G9340 Encephalopathy, unspecified: Secondary | ICD-10-CM | POA: Diagnosis present

## 2019-08-02 DIAGNOSIS — R519 Headache, unspecified: Secondary | ICD-10-CM | POA: Diagnosis not present

## 2019-08-02 DIAGNOSIS — F05 Delirium due to known physiological condition: Secondary | ICD-10-CM | POA: Diagnosis not present

## 2019-08-02 DIAGNOSIS — G7000 Myasthenia gravis without (acute) exacerbation: Secondary | ICD-10-CM | POA: Insufficient documentation

## 2019-08-02 DIAGNOSIS — Y92230 Patient room in hospital as the place of occurrence of the external cause: Secondary | ICD-10-CM | POA: Diagnosis not present

## 2019-08-02 DIAGNOSIS — Z9221 Personal history of antineoplastic chemotherapy: Secondary | ICD-10-CM | POA: Diagnosis not present

## 2019-08-02 DIAGNOSIS — M1990 Unspecified osteoarthritis, unspecified site: Secondary | ICD-10-CM | POA: Diagnosis present

## 2019-08-02 DIAGNOSIS — R471 Dysarthria and anarthria: Secondary | ICD-10-CM | POA: Diagnosis not present

## 2019-08-02 LAB — CBC WITH DIFFERENTIAL/PLATELET
Abs Immature Granulocytes: 0.03 10*3/uL (ref 0.00–0.07)
Basophils Absolute: 0 10*3/uL (ref 0.0–0.1)
Basophils Relative: 1 %
Eosinophils Absolute: 0.1 10*3/uL (ref 0.0–0.5)
Eosinophils Relative: 2 %
HCT: 41.1 % (ref 36.0–46.0)
Hemoglobin: 13.5 g/dL (ref 12.0–15.0)
Immature Granulocytes: 1 %
Lymphocytes Relative: 23 %
Lymphs Abs: 1.5 10*3/uL (ref 0.7–4.0)
MCH: 31.9 pg (ref 26.0–34.0)
MCHC: 32.8 g/dL (ref 30.0–36.0)
MCV: 97.2 fL (ref 80.0–100.0)
Monocytes Absolute: 0.6 10*3/uL (ref 0.1–1.0)
Monocytes Relative: 9 %
Neutro Abs: 4.1 10*3/uL (ref 1.7–7.7)
Neutrophils Relative %: 64 %
Platelets: 205 10*3/uL (ref 150–400)
RBC: 4.23 MIL/uL (ref 3.87–5.11)
RDW: 13.1 % (ref 11.5–15.5)
WBC: 6.3 10*3/uL (ref 4.0–10.5)
nRBC: 0 % (ref 0.0–0.2)

## 2019-08-02 LAB — COMPREHENSIVE METABOLIC PANEL
ALT: 26 U/L (ref 0–44)
ALT: 29 U/L (ref 0–44)
ALT: 21 U/L (ref 0–44)
AST: 24 U/L (ref 15–41)
AST: 41 U/L (ref 15–41)
AST: 23 U/L (ref 15–41)
Albumin: 4.2 g/dL (ref 3.5–5.0)
Albumin: 3.9 g/dL (ref 3.5–5.0)
Albumin: 3.8 g/dL (ref 3.5–5.0)
Alkaline Phosphatase: 50 U/L (ref 38–126)
Alkaline Phosphatase: 48 U/L (ref 38–126)
Alkaline Phosphatase: 50 U/L (ref 38–126)
Anion gap: 8 (ref 5–15)
Anion gap: 15 (ref 5–15)
Anion gap: 14 (ref 5–15)
BUN: 19 mg/dL (ref 8–23)
BUN: 15 mg/dL (ref 8–23)
BUN: 14 mg/dL (ref 8–23)
CO2: 26 mmol/L (ref 22–32)
CO2: 17 mmol/L — ABNORMAL LOW (ref 22–32)
CO2: 22 mmol/L (ref 22–32)
Calcium: 9.7 mg/dL (ref 8.9–10.3)
Calcium: 9.4 mg/dL (ref 8.9–10.3)
Calcium: 9.4 mg/dL (ref 8.9–10.3)
Chloride: 102 mmol/L (ref 98–111)
Chloride: 105 mmol/L (ref 98–111)
Chloride: 103 mmol/L (ref 98–111)
Creatinine, Ser: 0.75 mg/dL (ref 0.44–1.00)
Creatinine, Ser: 0.75 mg/dL (ref 0.44–1.00)
Creatinine, Ser: 0.73 mg/dL (ref 0.44–1.00)
GFR calc Af Amer: >60 mL/min (ref >60)
GFR calc Af Amer: >60 mL/min (ref >60)
GFR calc Af Amer: >60 mL/min (ref >60)
GFR calc non Af Amer: >60 mL/min (ref >60)
GFR calc non Af Amer: >60 mL/min (ref >60)
GFR calc non Af Amer: >60 mL/min (ref >60)
Glucose, Bld: 99 mg/dL (ref 70–99)
Glucose, Bld: 93 mg/dL (ref 70–99)
Glucose, Bld: 110 mg/dL — ABNORMAL HIGH (ref 70–99)
Potassium: 4.6 mmol/L (ref 3.5–5.1)
Potassium: 4.4 mmol/L (ref 3.5–5.1)
Potassium: 4.2 mmol/L (ref 3.5–5.1)
Sodium: 136 mmol/L (ref 135–145)
Sodium: 137 mmol/L (ref 135–145)
Sodium: 139 mmol/L (ref 135–145)
Total Bilirubin: 1.2 mg/dL (ref 0.3–1.2)
Total Bilirubin: 1.5 mg/dL — ABNORMAL HIGH (ref 0.3–1.2)
Total Bilirubin: 0.7 mg/dL (ref 0.3–1.2)
Total Protein: 6.9 g/dL (ref 6.5–8.1)
Total Protein: 6.7 g/dL (ref 6.5–8.1)
Total Protein: 6.7 g/dL (ref 6.5–8.1)

## 2019-08-02 LAB — URINALYSIS, ROUTINE W REFLEX MICROSCOPIC
Bacteria, UA: NONE SEEN
Bacteria, UA: NONE SEEN
Bilirubin Urine: NEGATIVE
Bilirubin Urine: NEGATIVE
Glucose, UA: 50 mg/dL — AB
Glucose, UA: 150 mg/dL — AB
Hgb urine dipstick: NEGATIVE
Ketones, ur: 20 mg/dL — AB
Ketones, ur: 20 mg/dL — AB
Leukocytes,Ua: NEGATIVE
Nitrite: NEGATIVE
Nitrite: NEGATIVE
Protein, ur: NEGATIVE mg/dL
Protein, ur: NEGATIVE mg/dL
Specific Gravity, Urine: 1.015 (ref 1.005–1.030)
Specific Gravity, Urine: 1.011 (ref 1.005–1.030)
pH: 6 (ref 5.0–8.0)
pH: 6 (ref 5.0–8.0)

## 2019-08-02 LAB — BLOOD GAS, VENOUS
Acid-Base Excess: 0.1 mmol/L (ref 0.0–2.0)
Bicarbonate: 24.8 mmol/L (ref 20.0–28.0)
O2 Saturation: 60.7 %
Patient temperature: 98.6
pCO2, Ven: 43 mmHg — ABNORMAL LOW (ref 44.0–60.0)
pH, Ven: 7.379 (ref 7.250–7.430)
pO2, Ven: 33.3 mmHg (ref 32.0–45.0)

## 2019-08-02 LAB — TSH: TSH: 1.968 u[IU]/mL (ref 0.350–4.500)

## 2019-08-02 LAB — PROTIME-INR
INR: 1.1 (ref 0.8–1.2)
Prothrombin Time: 14.1 seconds (ref 11.4–15.2)

## 2019-08-02 LAB — SARS CORONAVIRUS 2 (TAT 6-24 HRS): SARS Coronavirus 2: NEGATIVE

## 2019-08-02 MED ORDER — PYRIDOSTIGMINE BROMIDE 60 MG PO TABS
60.0000 mg | ORAL_TABLET | Freq: Four times a day (QID) | ORAL | Status: DC
  Filled 2019-08-02 (×3): qty 1

## 2019-08-02 MED ORDER — PREDNISONE 20 MG PO TABS
60.0000 mg | ORAL_TABLET | Freq: Once | ORAL | Status: AC
  Administered 2019-08-02: 60 mg via ORAL
  Filled 2019-08-02: qty 3

## 2019-08-02 MED ORDER — METHYLPREDNISOLONE 4 MG PO TBPK: ORAL_TABLET | ORAL | 0 refills | Status: DC

## 2019-08-02 MED ORDER — ACETAMINOPHEN 325 MG PO TABS
650.0000 mg | ORAL_TABLET | Freq: Four times a day (QID) | ORAL | Status: DC | PRN
  Administered 2019-08-05 – 2019-08-07 (×2): 650 mg via ORAL
  Filled 2019-08-02 (×2): qty 2

## 2019-08-02 MED ORDER — SODIUM CHLORIDE 0.9% FLUSH: 3.0000 mL | Freq: Once | INTRAVENOUS | Status: DC

## 2019-08-02 MED ORDER — ENOXAPARIN SODIUM 40 MG/0.4ML ~~LOC~~ SOLN
40.0000 mg | SUBCUTANEOUS | Status: DC
  Administered 2019-08-03 – 2019-08-07 (×5): 40 mg via SUBCUTANEOUS
  Filled 2019-08-02 (×5): qty 0.4

## 2019-08-02 MED ORDER — SODIUM CHLORIDE 0.9 % IV BOLUS
500.0000 mL | Freq: Once | INTRAVENOUS | Status: AC
  Administered 2019-08-02: 500 mL via INTRAVENOUS

## 2019-08-02 MED ORDER — ASPIRIN 300 MG RE SUPP
300.0000 mg | Freq: Once | RECTAL | Status: AC
  Administered 2019-08-03: 300 mg via RECTAL
  Filled 2019-08-02: qty 1

## 2019-08-02 MED ORDER — ACETAMINOPHEN 650 MG RE SUPP: 650.0000 mg | Freq: Four times a day (QID) | RECTAL | Status: DC | PRN

## 2019-08-02 MED ORDER — ASPIRIN EC 81 MG PO TBEC
81.0000 mg | DELAYED_RELEASE_TABLET | Freq: Every day | ORAL | Status: DC
  Administered 2019-08-04 – 2019-08-07 (×4): 81 mg via ORAL
  Filled 2019-08-02 (×4): qty 1

## 2019-08-02 MED ORDER — ASPIRIN 300 MG RE SUPP
150.0000 mg | Freq: Every day | RECTAL | Status: DC
  Administered 2019-08-03: 09:00:00 150 mg via RECTAL
  Filled 2019-08-02: qty 1

## 2019-08-02 NOTE — H&P (Addendum)
Date: 08/02/2019               Patient Name:  Elaine Owens MRN: [id number]  DOB: 22-Jun-1926 Age / Sex: 85 y.o., female   PCP: Orvis Brill, PA-C         Medical Service: Internal Medicine Teaching Service         Attending Physician: Dr. Sedonia Small Barth Kirks, MD    First Contact: Dr. Ronnald Ramp Pager: [phone]  Second Contact: Dr. Eileen Stanford  Pager: [phone]       After Hours (After 5p/  First Contact Pager: [phone]  weekends / holidays): Second Contact Pager: [phone]   Chief Complaint: Dysphagia   History of Present Illness: Ms. Elaine Owens is a 85 y.o female with the past medical history of myasthenia gravis, dementia, HTN, and breast cancer (s/p mastectomy & tamoxifen tx in 1980s), and hearing loss who presented with dysphagia and dysarthria. The patient states her symptoms started about 1 month ago. She noticed she was having difficulty chewing and swallowing foods. She tried chewing for longer periods of time, and tried eating softer foods like scrambled eggs but this did not seem to help. She noted having difficulty swallowing both solid and liquid foods. She denied experiencing fatigue with prolonged chewing. However, she did notice general weakness that has increased over the past month.  When questioned on weakness with repetitive motions, the patient states she did has not noticed, but does think she would get very tired if she tried walking upstairs.  In one of the ED notes, the daughter reported that the patient recently has been collapsing on the furniture when attempting to sit down.  The patient denies any speech changes, however does appear to have some slurred speech. She also denies having any respiratory difficulties. She endorses having some blurred vision, but denies having chest pain, SOB, cough, abdominal pain, changes in her bowel or bladder habits, numbness or tingling in any extremities, or weakness in extremities.  Of note, prior to presenting to [facility], the  patient was initially evaluated at [facility] ED. Neuro was consulted and she was given Solu-Medrol and a Medrol Dosepak to take over the next few days. She was discharged home, however when her daughter was helping her take a shower she noticed her mother having difficulty speaking, gargling and appeared to be choking, so she brought her to [facility], ED.   In the ED, a CBC, CMP, UA were obtained.  Labs were unremarkable.  CXR unremarkable. CT of the head did not show acute intracranial abnormalities, but did have an old right cerebellar infarct.  MRI of the brain did not show any acute changes, but showed chronic right cerebellar infarct as well as chronic small vessel disease changes in the white matter.  Dr. Leonel Ramsay of neurology was consulted while the patient was in the ED as well.  Meds:  - Pyridostigmine 60 mg 4 times a day - Vitamin C 1000 mg daily - Flonase 2 sprays in both nostrils daily - Protonix 40 mg daily  Allergies: Allergies as of 08/02/2019 - Review Complete 08/02/2019  Allergen Reaction Noted  . Ciprofloxacin Other (See Comments) 03/20/2013  . Doxycycline hyclate Other (See Comments) 03/19/2013  . Morphine Nausea And Vomiting 10/17/2014  . Morphine and related Nausea And Vomiting 03/19/2013   Past Medical History:  Diagnosis Date  . Arthritis   . Complication of anesthesia    hard to wake up  . Hypertension   . Immune  deficiency disorder ([place])   . lt breast ca dx'd 1978 or 1988 pt unsure   lt masectomy; tamoxifen completed  . Myasthenia gravis [facility])    Past Surgical History:  Procedure Laterality Date  . ABDOMINAL HYSTERECTOMY    . APPENDECTOMY    . BREAST SURGERY    . ESOPHAGOGASTRODUODENOSCOPY (EGD) WITH PROPOFOL N/A 10/12/2018   Procedure: ESOPHAGOGASTRODUODENOSCOPY (EGD) WITH PROPOFOL;  Surgeon: Otis Brace, MD;  Location: [facility] ENDOSCOPY;  Service: Gastroenterology;  Laterality: N/A;  . EYE SURGERY     lens implant  . HEMORRHOID SURGERY    .  masectomny Left    1978  . MASTECTOMY     Family History:  Family History  Problem Relation Age of Onset  . Colon cancer Father   . Asthma Father   . Liver cancer Mother    Denies family history of myasthenia gravis, no other significant family history to report.  Social History:  - Lives at home with her daughter, who is her caretaker.  Daughter helps her with ADLs including cooking, cleaning, and bathing. - Ambulates with a walker, denies any recent falls - Denies alcohol, tobacco, or drug use.  Imaging: CT Head: IMPRESSION: 1. No acute intracranial abnormality, no acute or subacute infarct identified by CT. 2. Chronic right cerebellum SCA territory infarct and mild for age cerebral white matter changes.  MRI Brain: IMPRESSION: 1. No acute or subacute intracranial abnormality identified. 2. Chronic right cerebellar infarct (right SCA territory). Moderate for age cerebral white matter signal changes, most commonly due to chronic small vessel disease.  EKG:  Normal sinus rhythm Left axis deviation  CXR:  IMPRESSION: No active disease  Review of Systems: All systems were reviewed and are otherwise negative unless mentioned in the HPI.  Physical Exam: Blood pressure (!) 152/69, pulse 71, temperature 97.6 F (36.4 C), resp. rate 19, height 5\' 1"  (1.549 m), weight 59 kg, SpO2 97 %.  Physical Exam Vitals signs reviewed.  Constitutional:      General: She is not in acute distress.    Appearance: Normal appearance. She is normal weight. She is not ill-appearing, toxic-appearing or diaphoretic.  HENT:     Head: Normocephalic and atraumatic.     Ears:     Comments: Grossly hard of hearing    Mouth/Throat:     Mouth: Mucous membranes are moist.     Pharynx: Oropharynx is clear. No oropharyngeal exudate or posterior oropharyngeal erythema.  Eyes:     General: No scleral icterus.       Right eye: No discharge.        Left eye: No discharge.     Conjunctiva/sclera:  Conjunctivae normal.     Pupils: Pupils are equal, round, and reactive to light.     Comments: EOMI, but R eye does not converge  Cardiovascular:     Rate and Rhythm: Normal rate and regular rhythm.     Pulses: Normal pulses.     Heart sounds: Normal heart sounds. No murmur. No friction rub. No gallop.   Pulmonary:     Effort: Pulmonary effort is normal. No respiratory distress.     Breath sounds: Normal breath sounds. No wheezing or rales.  Abdominal:     General: Abdomen is flat. Bowel sounds are normal. There is no distension.     Palpations: Abdomen is soft.     Tenderness: There is no abdominal tenderness. There is no guarding.  Musculoskeletal:        General: No swelling.  Right lower leg: No edema.     Left lower leg: No edema.     Comments: RUE: 5/5 with flexion and extension LUE: 5/5 with flexion and extension RLE: 5/5 with knee and ankle flexion and extension LLE: 5/5 with knee and ankle flexion and extension  Skin:    General: Skin is warm.  Neurological:     General: No focal deficit present.     Mental Status: She is alert and oriented to person, place, and time.     Cranial Nerves: No cranial nerve deficit.     Sensory: No sensory deficit.     Motor: No weakness.     Comments: Patient able to pull himself up to a seated position with assistance, and hold it there.  Psychiatric:        Mood and Affect: Mood normal.        Behavior: Behavior normal.    Assessment & Plan by Problem: Active Problems:   Myasthenia gravis ([place])  In summary, Ms. Shanor is a 85 y.o female with the past medical history of myasthenia gravis, dementia, HTN, and breast cancer (s/p mastectomy & tamoxifen tx in 1980s), and hearing loss who presented with generalized weakness, dysphagia and dysarthria raising concerns for myasthenia gravis exacerbation.   #Dyshphagia #Dysarthria  #Myasthenia gravis: Given the patient's presentation of dysphagia and dysarthria, stroke work-up has been  initiated. No imaging findings or physical exam findings to suggest an acute stroke at this time. Patient received aspirin and a dose of methylprednisolone in the ED.  I believe this is more likely an exacerbation of her underlying myasthenia gravis. Patient takes pyridostigmine 60 mg tablets 4 times daily at home.  Neurology consulted, will appreciate their recommendations. - Continue aspirin 81 mg daily - PT/OT/SLP evaluations ordered - Follow-up neurology recommendations  - MRI negative, no need for secondary stroke w/u  - Continue home pyridostigmine 60 mg 4 times daily  - Start prednisone 20 mg   - EEG ordered  - May consider IVIG if pt is unable to swallow  #FEN/GI - Diet: NPO, sips with meds - Fluids: LR 75 cc/hr   #DVT prophylaxis - Lovenox 40 mg subq injections daily  #CODE STATUS: DNR  #Dispo: Admit patient to Inpatient with expected length of stay greater than 2 midnights.  Signed: Earlene Plater, MD Internal Medicine, PGY1 Pager: [phone]  08/02/2019,1:35 AM

## 2019-08-02 NOTE — ED Notes (Signed)
Spoke to the patient daughter and explain discharged paper and medication to her.

## 2019-08-02 NOTE — ED Triage Notes (Signed)
Patient coming from home with c/o difficulties swallowing. Patient was choking on eggs this morning and this is been happing more frequent at home lately per daughter. Patient had a stoke a month ago and since then patient is being having this difficulties with swallowing Patient is able to clear her airway. Hx. Dementia. A/o x 4

## 2019-08-02 NOTE — ED Provider Notes (Signed)
[facility] EMERGENCY DEPARTMENT Provider Note   CSN: [id number] Arrival date & time: 08/02/19  1734     History   Chief Complaint Chief Complaint  Patient presents with   Aphasia    HPI Elaine Owens is a 85 y.o. female.     The history is provided by the patient and a relative.  Patient was seen at [facility] earlier today for trouble swallowing x1 week.  She apparently is having a myasthenia gravis flareup, for which neurology was consulted at [facility] long, and they recommended Solu-Medrol and a Medrol Dosepak for the next few days.  Patient was taken home by her daughter, who reports that when they first got home she unexpectedly became urinary incontinent without warning when normally patient is able to give some forewarning to go to the restroom.  While in the shower to get her cleaned up, patient's daughter noticed that she was having trouble speaking, gargling, appeared to almost be choking.  Family reports that she has been sleepy and increasingly more tired over the last 1 week.  Patient's daughter was concerned and brought her in to be further evaluated in our emergency department via EMS.  No medications were given prior to arrival, nothing seems to make symptoms better or worse.  History is primarily obtained from EMS as well as patient's daughter as patient has dementia.  Past Medical History:  Diagnosis Date   Arthritis    Complication of anesthesia    hard to wake up   Hypertension    Immune deficiency disorder ([place])    lt breast ca dx'd 1978 or 1988 pt unsure   lt masectomy; tamoxifen completed   Myasthenia gravis [facility])     Patient Active Problem List   Diagnosis Date Noted   Symptomatic anemia 10/11/2018   GIB (gastrointestinal bleeding) 10/11/2018   Anemia due to acute blood loss    Upper airway cough syndrome vs cough variant asthma 03/02/2015   Essential hypertension 07/15/2013   Generalized weakness 03/20/2013    Acute renal failure ([place]) 03/20/2013   Myasthenia gravis ([place]) 03/20/2013   UTI (urinary tract infection) 03/20/2013    Past Surgical History:  Procedure Laterality Date   ABDOMINAL HYSTERECTOMY     APPENDECTOMY     BREAST SURGERY     ESOPHAGOGASTRODUODENOSCOPY (EGD) WITH PROPOFOL N/A 10/12/2018   Procedure: ESOPHAGOGASTRODUODENOSCOPY (EGD) WITH PROPOFOL;  Surgeon: Otis Brace, MD;  Location: [facility] ENDOSCOPY;  Service: Gastroenterology;  Laterality: N/A;   EYE SURGERY     lens implant   HEMORRHOID SURGERY     masectomny Left    1978   MASTECTOMY       OB History   No obstetric history on file.      Home Medications    Prior to Admission medications   Medication Sig Start Date End Date Taking? Authorizing Provider  acetaminophen (ACETAMINOPHEN 8 HOUR) 650 MG CR tablet Take 1,300 mg by mouth 2 (two) times daily.    [provider]  Ascorbic Acid (VITAMIN C) 1000 MG tablet Take 1,000 mg by mouth daily.    [provider]  Calcium Carbonate-Vitamin D (CALCIUM-VITAMIN D) 600-125 MG-UNIT TABS Take 2 tablets by mouth daily.    [provider]  fluticasone (FLONASE) 50 MCG/ACT nasal spray Place 2 sprays into both nostrils daily. Patient not taking: Reported on 10/11/2018 04/22/15   Chesley Mires, MD  methylPREDNISolone (MEDROL DOSEPAK) 4 MG TBPK tablet Take q day 6,5,4,3,2,1 08/02/19   Daleen Bo,  MD  pantoprazole (PROTONIX) 40 MG tablet Take 1 tablet (40 mg total) by mouth daily. Patient not taking: Reported on 08/02/2019 10/13/18   Kayleen Memos, DO  pyridostigmine (MESTINON) 60 MG tablet Take 60 mg by mouth 4 (four) times daily.     [provider]  vitamin B-12 (CYANOCOBALAMIN) 1000 MCG tablet Take 1,000 mcg by mouth daily.    [provider]    Family History Family History  Problem Relation Age of Onset   Colon cancer Father    Asthma Father    Liver cancer Mother     Social History Social History   Tobacco  Use   Smoking status: Never Smoker   Smokeless tobacco: Never Used  Substance Use Topics   Alcohol use: No   Drug use: Not on file     Allergies   Ciprofloxacin, Doxycycline hyclate, Morphine, and Morphine and related   Review of Systems Review of Systems  Unable to perform ROS: Dementia     Physical Exam Updated Vital Signs BP (!) 152/69    Pulse 71    Temp 97.6 F (36.4 C)    Resp 19    Ht 5\' 1"  (1.549 m)    Wt 59 kg    SpO2 97%    BMI 24.56 kg/m   Physical Exam Vitals signs and nursing note reviewed.  Constitutional:      General: She is not in acute distress.    Appearance: She is well-developed.     Comments: Thin and frail appearance  HENT:     Head: Normocephalic and atraumatic.  Eyes:     Conjunctiva/sclera: Conjunctivae normal.  Neck:     Musculoskeletal: Neck supple.  Cardiovascular:     Rate and Rhythm: Normal rate and regular rhythm.     Heart sounds: No murmur.  Pulmonary:     Effort: Pulmonary effort is normal. No respiratory distress.     Breath sounds: Normal breath sounds.     Comments: Clear lung sounds bilaterally Abdominal:     General: There is no distension.     Palpations: Abdomen is soft.     Tenderness: There is no abdominal tenderness. There is no guarding or rebound.     Comments: No abdominal tenderness to palpation  Skin:    General: Skin is warm and dry.     Comments: No trauma visible  Neurological:     Mental Status: She is alert and oriented to person, place, and time.     Cranial Nerves: No cranial nerve deficit.     Motor: No weakness.     Comments: No slurred speech, no facial droop, no focal deficit currently.  Pleasantly demented.  Alert and oriented x3.  Psychiatric:        Mood and Affect: Mood normal.        Behavior: Behavior normal.      ED Treatments / Results  Labs (all labs ordered are listed, but only abnormal results are displayed) Labs Reviewed  COMPREHENSIVE METABOLIC PANEL - Abnormal; Notable  for the following components:      Result Value   CO2 17 (*)    Total Bilirubin 1.5 (*)    All other components within normal limits  COMPREHENSIVE METABOLIC PANEL - Abnormal; Notable for the following components:   Glucose, Bld 110 (*)    All other components within normal limits  URINALYSIS, ROUTINE W REFLEX MICROSCOPIC - Abnormal; Notable for the following components:   Color, Urine STRAW (*)  Glucose, UA 150 (*)    Hgb urine dipstick SMALL (*)    Ketones, ur 20 (*)    All other components within normal limits  SARS CORONAVIRUS 2 (TAT 6-24 HRS)  PROTIME-INR  TSH  PROTIME-INR  APTT  CBC WITH DIFFERENTIAL/PLATELET    EKG EKG Interpretation  Date/Time:  Sunday August 02 2019 18:03:01 EST Ventricular Rate:  81 PR Interval:  140 QRS Duration: 72 QT Interval:  372 QTC Calculation: 432 R Axis:   -66 Text Interpretation: Normal sinus rhythm Left axis deviation Abnormal ECG Confirmed by Gerlene Fee [phone]) on 08/02/2019 9:00:47 PM   Radiology Ct Head Wo Contrast  Result Date: 08/02/2019 CLINICAL DATA:  85 year old female with altered mental status. Difficulty swallowing and choking. Reports stroke 1 month ago. EXAM: CT HEAD WITHOUT CONTRAST TECHNIQUE: Contiguous axial images were obtained from the base of the skull through the vertex without intravenous contrast. COMPARISON:  Head CT 10/11/2018. FINDINGS: Brain: Stable cerebral volume. No midline shift, mass effect, or evidence of intracranial mass lesion. No ventriculomegaly. No acute intracranial hemorrhage identified. Chronic right superior cerebellar infarct is stable since January. Mild for age scattered cerebral white matter hypodensity appears stable. No acute or of all vein cortically based infarct identified, no supratentorial cortical encephalomalacia. Vascular: Calcified atherosclerosis at the skull base. No suspicious intracranial vascular hyperdensity. Skull: Hyperostosis.  No acute osseous abnormality identified.  Sinuses/Orbits: Visualized paranasal sinuses and mastoids are stable and well pneumatized. Other: No acute orbit or scalp soft tissue findings. IMPRESSION: 1. No acute intracranial abnormality, no acute or subacute infarct identified by CT. 2. Chronic right cerebellum SCA territory infarct and mild for age cerebral white matter changes. Electronically Signed   By: Genevie Ann M.D.   On: 08/02/2019 21:03   Dg Chest Portable 1 View  Result Date: 08/02/2019 CLINICAL DATA:  Evaluate for aspiration pneumonia EXAM: PORTABLE CHEST 1 VIEW COMPARISON:  08/02/2019 FINDINGS: Heart and mediastinal contours are within normal limits. No focal opacities or effusions. No acute bony abnormality. IMPRESSION: No active disease. Electronically Signed   By: Rolm Baptise M.D.   On: 08/02/2019 21:22   Dg Chest Port 1 View  Result Date: 08/02/2019 CLINICAL DATA:  Dysphagia. EXAM: PORTABLE CHEST 1 VIEW COMPARISON:  01/08/2015 FINDINGS: Interval borderline enlarged cardiac silhouette. Aortic arch calcifications. Clear lungs. Diffuse osteopenia. Thoracic spine degenerative changes and mild scoliosis. Mild bilateral glenohumeral degenerative changes. IMPRESSION: 1. No acute abnormality. 2. Interval borderline cardiomegaly. 3. Aortic atherosclerosis. Electronically Signed   By: Claudie Revering M.D.   On: 08/02/2019 11:57    Procedures Procedures (including critical care time)  Medications Ordered in ED Medications  sodium chloride flush (NS) 0.9 % injection 3 mL (has no administration in time range)  aspirin suppository 300 mg (has no administration in time range)     Initial Impression / Assessment and Plan / ED Course  I have reviewed the triage vital signs and the nursing notes.  Pertinent labs & imaging results that were available during my care of the patient were reviewed by me and considered in my medical decision making (see chart for details).        Elaine Owens is a 85 y.o. female with a past medical  history of myasthenia gravis presents for dysphagia, one episode of urinary incontinence, concern for difficulty speaking/choking prior to arrival.  On my exam, she is not aphasic.  Labs and imaging ordered for differential diagnosis of intracranial bleeding/abnormality, electrolyte abnormality, sepsis, anemia.  Patient has already  received 125 mg of Solu-Medrol prior to arrival IV at [place] [facility].  Labs and imaging showed no acute change beyond chronic small vessel disease, no significant leukocytosis, anemia, electrolyte abnormality.  Neurology consulted for consideration of MRI versus other imaging study.  They recommend MRI without contrast, no TIA work-up if negative.  Patient will need a medicine admission for further evaluation and management, and will likely need a speech therapy consult as well as a swallow study while inpatient.  Care of patient was discussed with the supervising attending.  Final Clinical Impressions(s) / ED Diagnoses   Final diagnoses:  Myasthenia gravis ([place])  Dysphagia, unspecified type    ED Discharge Orders    None       Julianne Rice, MD 08/02/19 2253    Maudie Flakes, MD 08/03/19 [id number]

## 2019-08-02 NOTE — ED Triage Notes (Signed)
Report from [facility]> pt from home. Seen at [facility] this morning for trouble swallowing x 1 week.  D/c home with flare-up of myasthenia gravis.  Pt incontinent of urine on the way home from hospital.  Family put her in shower when she got home and they report episode of slurred speech.  Per EMS, no slurred speech or neuro deficits on their arrival.  Family reports increased difficulty walking and lethargic over the last week.

## 2019-08-02 NOTE — ED Notes (Addendum)
[phone], daughter of patient

## 2019-08-02 NOTE — Consult Note (Addendum)
Neurology Consultation Reason for Consult: Dysphagia Referring Physician: Viona Gilmore  CC: Dysphagia  History is obtained from: Chart review, referring provider  HPI: Elaine Owens is a 85 y.o. female who presented earlier today with dysphagia. This has apparently been going on for a week. She was seen by Dr Eulis Foster at [facility] and there was concern for a mild exacerbation of her MG and was given solumedrol 125mg  and planned for outpatient course of steroids.   Today, she was walking then had an episode of incontinence, stating  "I just peed on myself." She was apparently aware of it happening at the time. She then made a choking sound and had trouble getting words out. It is unclear if the speaking difficulty was simply due to choking or whether it was more of an aphasia type syndrome.  I tried calling her daughter, but did not get an answer.    The patient is amnestic to this event, but she appears to have some memory difficulty in general som I am unclear of the significance of this.    ROS: A 14 point ROS was performed and is negative except as noted in the HPI.   Past Medical History:  Diagnosis Date  . Arthritis   . Complication of anesthesia    hard to wake up  . Hypertension   . Immune deficiency disorder ([place])   . lt breast ca dx'd 1978 or 1988 pt unsure   lt masectomy; tamoxifen completed  . Myasthenia gravis ([place])      Family History  Problem Relation Age of Onset  . Colon cancer Father   . Asthma Father   . Liver cancer Mother      Social History:  reports that she has never smoked. She has never used smokeless tobacco. She reports that she does not drink alcohol. No history on file for drug.   Exam: Current vital signs: BP (!) 152/69   Pulse 71   Temp 97.6 F (36.4 C)   Resp 19   Ht 5\' 1"  (1.549 m)   Wt 59 kg   SpO2 97%   BMI 24.56 kg/m  Vital signs in last 24 hours: Temp:  [97.6 F (36.4 C)-98 F (36.7 C)] 97.6 F (36.4 C) (11/15 1800) Pulse  Rate:  [66-87] 71 (11/15 2203) Resp:  [14-20] 19 (11/15 2203) BP: (129-168)/(58-70) 152/69 (11/15 2203) SpO2:  [95 %-100 %] 97 % (11/15 2203) Weight:  [59 kg] 59 kg (11/15 1801)   Physical Exam  Constitutional: Appears well-developed and well-nourished.  Psych: Affect appropriate to situation Eyes: No scleral injection HENT: No OP obstrucion MSK: no joint deformities.  Cardiovascular: Normal rate and regular rhythm.  Respiratory: Effort normal, non-labored breathing GI: Soft.  No distension. There is no tenderness.  Skin: WDI  Neuro: Mental Status: Patient is awake, alert, oriented to person, place, month, year, thinks she is "in the basement"  No signs of aphasia or neglect She is able to count to >25 on a single breath Cranial Nerves: II: Visual Fields are full. Pupils are equal, round, and reactive to light.   III,IV, VI: She has some restriction in upgaze bilaterally, with sustained upgaze, she has lateral deviation of the right eye.  V: Facial sensation is symmetric to temperature VII: transverse smile VIII: hearing is intact to voice X: Uvula elevates symmetrically XI: Shoulder shrug is symmetric. XII: tongue is midline without atrophy or fasciculations.  Motor: Tone is normal. Bulk is normal. 5/5 strength was present in  all four extremities.  Sensory: Sensation is symmetric to light touch and temperature in the arms and legs. Cerebellar: FNF and HKS are intact bilaterally    I have reviewed labs in epic and the results pertinent to this consultation are: CMP-unremarkable TSH is unremarkable  I have reviewed the images obtained: Old infarct in the cerebellum  Impression: 85 year old female with increasing dysphagia over the past week.  With diplopia with upgaze and transverse smile, there is a significant possibility that the dysphagia is related to her myasthenia.  With her history of stroke and age, I do think that excluding other causes of dysphagia would be  prudent and therefore I will order an MRI of the brain.  The episode that occurred at home is of unclear etiology to me, but I do think an EEG would be prudent.  With a history of stroke, I do think antiplatelet therapy is indicated and therefore would recommend baby aspirin daily.  Stroke work-up only if MRI is positive.  Steroids would be an appropriate intervention if the MRI is negative, but the risks will need to be considered a 85 year old.  IVIG would be fairly aggressive, but if she is unable to swallow then this may be indicated.  Recommendations: 1) MRI brain, stroke workup only if positive.  2) ASA 81mg  daily.  3) speech therapy evaluation.  4) eeg 5) consider starting prednisone 20 mg daily if MRI is negative 6) continue Mestinon at home dose  Roland Rack, MD [facility] [phone]  If 7pm- 7am, please page neurology on call as listed in [place].

## 2019-08-02 NOTE — Discharge Instructions (Signed)
Offer her small amounts of food, which is soft and liquids, frequently.  Start the Medrol dose pack prescription tomorrow.  Return here if needed.  Call her neurologist early tomorrow morning for appointment to be seen and managed by them for what we think appears to be myasthenia gravis exacerbation.

## 2019-08-02 NOTE — ED Provider Notes (Signed)
[facility] DEPT Provider Note   CSN: [id number] Arrival date & time: 08/02/19  1034     History   Chief Complaint No chief complaint on file.   HPI Elaine Owens is a 85 y.o. female.     HPI   Patient presents by EMS for evaluation of "choking."  She is unable to give any history.  Level 5 caveat-dementia  10:55 AM-I was able to reach the patient's daughter Elaine Owens, who gave me additional history.  Patient has been "choking on food," both solids and liquids for 1 week.  Her daughter has been trying to give her soft foods like scrambled eggs but this has not helped.  Her daughter is concerned that her myasthenia has gotten worse causing the difficulty swallowing.  Also the patient has been weaker than usual and sometimes collapses onto furniture when she attempts to sit.  2 days ago she had some diarrhea and vomiting, which both have resolved.  Patient has been ambulating less than usual.  There is been no fever, cough, loss of consciousness or noted trouble with bowels or urine.  Patient had outpatient evaluation by a neurologist in September 2020 and an MRI then showed "a stroke."  No known sick contacts, currently.  Past Medical History:  Diagnosis Date  . Arthritis   . Complication of anesthesia    hard to wake up  . Hypertension   . Immune deficiency disorder ([place])   . lt breast ca dx'd 1978 or 1988 pt unsure   lt masectomy; tamoxifen completed  . Myasthenia gravis [facility])     Patient Active Problem List   Diagnosis Date Noted  . Symptomatic anemia 10/11/2018  . GIB (gastrointestinal bleeding) 10/11/2018  . Anemia due to acute blood loss   . Upper airway cough syndrome vs cough variant asthma 03/02/2015  . Essential hypertension 07/15/2013  . Generalized weakness 03/20/2013  . Acute renal failure ([place]) 03/20/2013  . Myasthenia gravis ([place]) 03/20/2013  . UTI (urinary tract infection) 03/20/2013    Past Surgical History:  Procedure  Laterality Date  . ABDOMINAL HYSTERECTOMY    . APPENDECTOMY    . BREAST SURGERY    . ESOPHAGOGASTRODUODENOSCOPY (EGD) WITH PROPOFOL N/A 10/12/2018   Procedure: ESOPHAGOGASTRODUODENOSCOPY (EGD) WITH PROPOFOL;  Surgeon: Otis Brace, MD;  Location: [facility] ENDOSCOPY;  Service: Gastroenterology;  Laterality: N/A;  . EYE SURGERY     lens implant  . HEMORRHOID SURGERY    . masectomny Left    1978  . MASTECTOMY       OB History   No obstetric history on file.      Home Medications    Prior to Admission medications   Medication Sig Start Date End Date Taking? Authorizing Provider  acetaminophen (ACETAMINOPHEN 8 HOUR) 650 MG CR tablet Take 1,300 mg by mouth 2 (two) times daily.   Yes [provider]  Ascorbic Acid (VITAMIN C) 1000 MG tablet Take 1,000 mg by mouth daily.   Yes [provider]  Calcium Carbonate-Vitamin D (CALCIUM-VITAMIN D) 600-125 MG-UNIT TABS Take 2 tablets by mouth daily.   Yes [provider]  pyridostigmine (MESTINON) 60 MG tablet Take 60 mg by mouth 4 (four) times daily.    Yes [provider]  vitamin B-12 (CYANOCOBALAMIN) 1000 MCG tablet Take 1,000 mcg by mouth daily.   Yes [provider]  fluticasone (FLONASE) 50 MCG/ACT nasal spray Place 2 sprays into both nostrils daily. Patient not taking: Reported on 10/11/2018 04/22/15  Chesley Mires, MD  methylPREDNISolone (MEDROL DOSEPAK) 4 MG TBPK tablet Take q day 6,5,4,3,2,1 08/02/19   Daleen Bo, MD  pantoprazole (PROTONIX) 40 MG tablet Take 1 tablet (40 mg total) by mouth daily. Patient not taking: Reported on 08/02/2019 10/13/18   Kayleen Memos, DO    Family History Family History  Problem Relation Age of Onset  . Colon cancer Father   . Asthma Father   . Liver cancer Mother     Social History Social History   Tobacco Use  . Smoking status: Never Smoker  . Smokeless tobacco: Never Used  Substance Use Topics  . Alcohol use: No  . Drug use: Not on file      Allergies   Ciprofloxacin, Doxycycline hyclate, Morphine, and Morphine and related   Review of Systems Review of Systems  Unable to perform ROS: Dementia     Physical Exam Updated Vital Signs BP (!) 151/70   Pulse 66   Temp 98 F (36.7 C) (Oral)   Resp 15   SpO2 99%   Physical Exam Vitals signs and nursing note reviewed.  Constitutional:      General: She is not in acute distress.    Appearance: She is well-developed. She is not ill-appearing, toxic-appearing or diaphoretic.     Comments: Frail, elderly  HENT:     Head: Normocephalic and atraumatic.     Right Ear: External ear normal.     Left Ear: External ear normal.  Eyes:     Conjunctiva/sclera: Conjunctivae normal.     Pupils: Pupils are equal, round, and reactive to light.  Neck:     Musculoskeletal: Normal range of motion and neck supple.     Trachea: Phonation normal.  Cardiovascular:     Rate and Rhythm: Normal rate and regular rhythm.     Heart sounds: Normal heart sounds.  Pulmonary:     Effort: Pulmonary effort is normal.     Breath sounds: Normal breath sounds.  Abdominal:     Palpations: Abdomen is soft.     Tenderness: There is no abdominal tenderness.  Musculoskeletal: Normal range of motion.  Skin:    General: Skin is warm and dry.  Neurological:     Mental Status: She is alert.     Cranial Nerves: No cranial nerve deficit.     Sensory: No sensory deficit.     Motor: No abnormal muscle tone.     Coordination: Coordination normal.     Comments: No dysarthria or aphasia.  Patient is able to pull herself to a sitting position on the gurney and hold her self there.  Psychiatric:        Mood and Affect: Mood normal.        Behavior: Behavior normal.      ED Treatments / Results  Labs (all labs ordered are listed, but only abnormal results are displayed) Labs Reviewed  URINALYSIS, ROUTINE W REFLEX MICROSCOPIC - Abnormal; Notable for the following components:      Result Value   Glucose,  UA 50 (*)    Ketones, ur 20 (*)    Leukocytes,Ua TRACE (*)    All other components within normal limits  BLOOD GAS, VENOUS - Abnormal; Notable for the following components:   pCO2, Ven 43.0 (*)    All other components within normal limits  SARS CORONAVIRUS 2 (TAT 6-24 HRS)  COMPREHENSIVE METABOLIC PANEL  CBC WITH DIFFERENTIAL/PLATELET    EKG EKG Interpretation  Date/Time:  Sunday August 02 2019  10:49:44 EST Ventricular Rate:  70 PR Interval:    QRS Duration: 90 QT Interval:  370 QTC Calculation: 400 R Axis:   -44 Text Interpretation: Sinus rhythm Left anterior fascicular block Anteroseptal infarct, old since last tracing no significant change Confirmed by Daleen Bo [phone]) on 08/02/2019 12:37:54 PM   Radiology Dg Chest Port 1 View  Result Date: 08/02/2019 CLINICAL DATA:  Dysphagia. EXAM: PORTABLE CHEST 1 VIEW COMPARISON:  01/08/2015 FINDINGS: Interval borderline enlarged cardiac silhouette. Aortic arch calcifications. Clear lungs. Diffuse osteopenia. Thoracic spine degenerative changes and mild scoliosis. Mild bilateral glenohumeral degenerative changes. IMPRESSION: 1. No acute abnormality. 2. Interval borderline cardiomegaly. 3. Aortic atherosclerosis. Electronically Signed   By: Claudie Revering M.D.   On: 08/02/2019 11:57    Procedures .Critical Care Performed by: Daleen Bo, MD Authorized by: Daleen Bo, MD   Critical care provider statement:    Critical care time (minutes):  35   Critical care start time:  08/02/2019 10:40 AM   Critical care end time:  08/02/2019 3:16 PM   Critical care time was exclusive of:  Separately billable procedures and treating other patients   Critical care was necessary to treat or prevent imminent or life-threatening deterioration of the following conditions:  CNS failure or compromise   Critical care was time spent personally by me on the following activities:  Blood draw for specimens, development of treatment plan with patient  or surrogate, discussions with consultants, evaluation of patient's response to treatment, examination of patient, obtaining history from patient or surrogate, ordering and performing treatments and interventions, ordering and review of laboratory studies, pulse oximetry, re-evaluation of patient's condition, review of old charts and ordering and review of radiographic studies   (including critical care time)  Medications Ordered in ED Medications  sodium chloride 0.9 % bolus 500 mL (0 mLs Intravenous Stopped 08/02/19 1158)     Initial Impression / Assessment and Plan / ED Course  I have reviewed the triage vital signs and the nursing notes.  Pertinent labs & imaging results that were available during my care of the patient were reviewed by me and considered in my medical decision making (see chart for details).  Clinical Course as of Aug 01 1517  Sun Aug 02, 2019  1236 Normal except PCO2 low  Blood gas, venous(!) [EW]  1236 Normal except presence of glucose, ketones, leukocytes  Urinalysis, Routine w reflex microscopic(!) [EW]  1236 Normal  CBC with Differential [EW]  1237 Normal  Comprehensive metabolic panel [EW]  [id number] No infiltrate or CHF, interpreted by me   [EW]  1424 I wash the patient eat and drink.  She was able to swallow applesauce, slowly, and appeared uncomfortable while doing so.  She swallowed liquid better but was also uncomfortable.  She complained of a sensation at the suprasternal notch, that she was unable to describe specifically.  She did not choke or vomit either the applesauce or liquid.   [EW]  1503 Case discussed with neurology, Dr. Lorraine Lax, who states patient can be treated with Medrol Dosepak and follow-up with her neurologist for possible exacerbation of myasthenia gravis.   [EW]    Clinical Course User Index [EW] Daleen Bo, MD        Patient Vitals for the past 24 hrs:  BP Temp Temp src Pulse Resp SpO2  08/02/19 1430 (!) 151/70 - - 66 15 99 %   08/02/19 1330 (!) 142/61 - - 66 16 95 %  08/02/19 1230 (!) 152/66 - - 67  16 96 %  08/02/19 1200 (!) 152/59 - - 66 20 97 %  08/02/19 1100 (!) 155/67 - - 68 17 96 %  08/02/19 1048 (!) 168/63 98 F (36.7 C) Oral 87 14 100 %  08/02/19 1043 - - - - - 96 %    3:04 PM Reevaluation with update and discussion. After initial assessment and treatment, an updated evaluation reveals patient remains comfortable.  I talked to the patient's daughter Elaine Owens on the phone regarding the findings, and confirmed recent history.  Patient is not on any new medication, and has not been complaining of trouble breathing.  Findings discussed and questions answered. [place] Decision Making: yes  CRITICAL CARE-no Performed by: Daleen Bo  Nursing Notes Reviewed/ Care Coordinated Applicable Imaging Reviewed Interpretation of Laboratory Data incorporated into ED treatment  The patient appears reasonably screened and/or stabilized for discharge and I doubt any other medical condition or other [facility] requiring further screening, evaluation, or treatment in the ED at this time prior to discharge.  Plan: Home Medications-usual; Home Treatments-small amounts of fluid and liquid, frequently as opposed to big meals; return here if the recommended treatment, does not improve the symptoms; Recommended follow up-call neurology for follow-up as soon as possible in the next few days.   Final Clinical Impressions(s) / ED Diagnoses   Final diagnoses:  Myasthenia gravis [facility])    ED Discharge Orders         Ordered    methylPREDNISolone (MEDROL DOSEPAK) 4 MG TBPK tablet     08/02/19 1514           Daleen Bo, MD 08/02/19 [id number]

## 2019-08-03 ENCOUNTER — Inpatient Hospital Stay (HOSPITAL_COMMUNITY): Payer: Medicare HMO

## 2019-08-03 DIAGNOSIS — I10 Essential (primary) hypertension: Secondary | ICD-10-CM

## 2019-08-03 DIAGNOSIS — Z9010 Acquired absence of unspecified breast and nipple: Secondary | ICD-10-CM

## 2019-08-03 DIAGNOSIS — R471 Dysarthria and anarthria: Secondary | ICD-10-CM

## 2019-08-03 DIAGNOSIS — Z885 Allergy status to narcotic agent status: Secondary | ICD-10-CM

## 2019-08-03 DIAGNOSIS — G7001 Myasthenia gravis with (acute) exacerbation: Principal | ICD-10-CM

## 2019-08-03 DIAGNOSIS — Z881 Allergy status to other antibiotic agents status: Secondary | ICD-10-CM

## 2019-08-03 DIAGNOSIS — Z79899 Other long term (current) drug therapy: Secondary | ICD-10-CM

## 2019-08-03 DIAGNOSIS — R479 Unspecified speech disturbances: Secondary | ICD-10-CM

## 2019-08-03 DIAGNOSIS — F0390 Unspecified dementia without behavioral disturbance: Secondary | ICD-10-CM

## 2019-08-03 DIAGNOSIS — Z66 Do not resuscitate: Secondary | ICD-10-CM

## 2019-08-03 DIAGNOSIS — Z9221 Personal history of antineoplastic chemotherapy: Secondary | ICD-10-CM

## 2019-08-03 DIAGNOSIS — R1310 Dysphagia, unspecified: Secondary | ICD-10-CM

## 2019-08-03 DIAGNOSIS — Z853 Personal history of malignant neoplasm of breast: Secondary | ICD-10-CM

## 2019-08-03 LAB — COMPREHENSIVE METABOLIC PANEL
ALT: 23 U/L (ref 0–44)
AST: 22 U/L (ref 15–41)
Albumin: 3.6 g/dL (ref 3.5–5.0)
Alkaline Phosphatase: 49 U/L (ref 38–126)
Anion gap: 15 (ref 5–15)
BUN: 15 mg/dL (ref 8–23)
CO2: 20 mmol/L — ABNORMAL LOW (ref 22–32)
Calcium: 9.3 mg/dL (ref 8.9–10.3)
Chloride: 101 mmol/L (ref 98–111)
Creatinine, Ser: 0.7 mg/dL (ref 0.44–1.00)
GFR calc Af Amer: >60 mL/min (ref >60)
GFR calc non Af Amer: >60 mL/min (ref >60)
Glucose, Bld: 126 mg/dL — ABNORMAL HIGH (ref 70–99)
Potassium: 4.2 mmol/L (ref 3.5–5.1)
Sodium: 136 mmol/L (ref 135–145)
Total Bilirubin: 0.6 mg/dL (ref 0.3–1.2)
Total Protein: 6.5 g/dL (ref 6.5–8.1)

## 2019-08-03 LAB — PHOSPHORUS: Phosphorus: 4.1 mg/dL (ref 2.5–4.6)

## 2019-08-03 LAB — CBC
HCT: 42.1 % (ref 36.0–46.0)
Hemoglobin: 14.2 g/dL (ref 12.0–15.0)
MCH: 31.5 pg (ref 26.0–34.0)
MCHC: 33.7 g/dL (ref 30.0–36.0)
MCV: 93.3 fL (ref 80.0–100.0)
Platelets: 206 10*3/uL (ref 150–400)
RBC: 4.51 MIL/uL (ref 3.87–5.11)
RDW: 12.5 % (ref 11.5–15.5)
WBC: 6.1 10*3/uL (ref 4.0–10.5)
nRBC: 0 % (ref 0.0–0.2)

## 2019-08-03 LAB — GLUCOSE, CAPILLARY: Glucose-Capillary: 110 mg/dL — ABNORMAL HIGH (ref 70–99)

## 2019-08-03 LAB — MAGNESIUM: Magnesium: 2.1 mg/dL (ref 1.7–2.4)

## 2019-08-03 MED ORDER — METHYLPREDNISOLONE SODIUM SUCC 40 MG IJ SOLR
16.0000 mg | Freq: Every day | INTRAMUSCULAR | Status: DC
  Administered 2019-08-03: 16 mg via INTRAVENOUS
  Filled 2019-08-03: qty 1

## 2019-08-03 MED ORDER — LACTATED RINGERS IV SOLN
INTRAVENOUS | Status: AC
  Administered 2019-08-03: 02:00:00 via INTRAVENOUS

## 2019-08-03 MED ORDER — PREDNISONE 20 MG PO TABS: 20.0000 mg | ORAL_TABLET | Freq: Every day | ORAL | Status: DC

## 2019-08-03 MED ORDER — PYRIDOSTIGMINE BROMIDE 60 MG PO TABS
60.0000 mg | ORAL_TABLET | Freq: Four times a day (QID) | ORAL | Status: DC
  Administered 2019-08-03 – 2019-08-07 (×18): 60 mg via ORAL
  Filled 2019-08-03 (×22): qty 1

## 2019-08-03 MED ORDER — PREDNISONE 20 MG PO TABS
20.0000 mg | ORAL_TABLET | Freq: Every day | ORAL | Status: DC
  Administered 2019-08-04 – 2019-08-07 (×4): 20 mg via ORAL
  Filled 2019-08-03 (×4): qty 1

## 2019-08-03 MED ORDER — PANTOPRAZOLE SODIUM 40 MG PO TBEC
40.0000 mg | DELAYED_RELEASE_TABLET | Freq: Every day | ORAL | Status: DC
  Administered 2019-08-03 – 2019-08-07 (×5): 40 mg via ORAL
  Filled 2019-08-03 (×5): qty 1

## 2019-08-03 MED ORDER — IMMUNE GLOBULIN (HUMAN) 10 GM/100ML IV SOLN
400.0000 mg/kg | INTRAVENOUS | Status: AC
  Administered 2019-08-03 – 2019-08-07 (×5): 25 g via INTRAVENOUS
  Filled 2019-08-03: qty 200
  Filled 2019-08-03: qty 300
  Filled 2019-08-03 (×2): qty 200
  Filled 2019-08-03: qty 50
  Filled 2019-08-03: qty 200
  Filled 2019-08-03: qty 250

## 2019-08-03 MED ORDER — PYRIDOSTIGMINE BROMIDE 10 MG/2ML IV SOLN
2.0000 mg | Freq: Four times a day (QID) | INTRAVENOUS | Status: DC
  Filled 2019-08-03 (×3): qty 0.4

## 2019-08-03 MED ORDER — METHYLPREDNISOLONE SODIUM SUCC 40 MG IJ SOLR: 16.0000 mg | Freq: Every day | INTRAMUSCULAR | Status: DC

## 2019-08-03 NOTE — Progress Notes (Signed)
CSW attempted to meet with the patient at bedside to discuss home health and discharge planning.   Patient was alert but confused. CSW will attempt to call and complete assessment with the patient's daughter.   CSW will continue to follow and assist with discharge planning.   Domenic Schwab, MSW, [place] Worker [facility]  [phone]

## 2019-08-03 NOTE — Procedures (Signed)
Patient Name: Elaine Owens  MRN: [id number]  Epilepsy Attending: Lora Havens  Referring Physician/Provider: Dr. Kathrynn Speed Date: 08/03/2019 Duration: 27 minutes  Patient history: 85 year old female with transient speech difficulty and urine incontinence.  EEG to evaluate for seizures.  Level of alertness: Awake  AEDs during EEG study: None  Technical aspects: This EEG study was done with scalp electrodes positioned according to the 10-20 International system of electrode placement. Electrical activity was acquired at a sampling rate of 500Hz  and reviewed with a high frequency filter of 70Hz  and a low frequency filter of 1Hz . EEG data were recorded continuously and digitally stored.   Description:The posterior dominant rhythm consists of 7 Hz activity of moderate voltage (25-35 uV) seen predominantly in posterior head regions, symmetric and reactive to eye opening and eye closing.  EEG showed continuous generalized polymorphic 2 to 5 Hz theta-delta slowing.  Physiologic photic driving was not seen during photic stimulation.  Hyperventilation was not performed.    Abnormality -Continuous slow, generalized -Background slow  IMPRESSION: This study is suggestive of mild to moderate diffuse encephalopathy, nonspecific to etiology. No seizures or epileptiform discharges were seen throughout the recording.  [name] [name]

## 2019-08-03 NOTE — Progress Notes (Signed)
   Subjective: Pt seen at the bedside this morning. Awake and sitting up in bed. Endorses presenting due to difficulty swallowing at home, but denies any new or acute concerns. Speech appears clear.  Objective:  Vital signs in last 24 hours: Vitals:   08/03/19 0230 08/03/19 0300 08/03/19 0504 08/03/19 0551  BP: 138/64 (!) 121/95    Pulse: 71     Resp: 19 (!) 22    Temp:   97.9 F (36.6 C) 97.9 F (36.6 C)  TempSrc:   Oral Oral  SpO2: 94%     Weight:      Height:       Physical Exam Vitals signs and nursing note reviewed.  Constitutional:      General: She is not in acute distress.    Appearance: She is not ill-appearing.  Eyes:     Extraocular Movements: Extraocular movements intact.     Comments: Pt denies diplopia with prolonged gaze.   Cardiovascular:     Rate and Rhythm: Normal rate and regular rhythm.     Heart sounds: Normal heart sounds.  Pulmonary:     Effort: Pulmonary effort is normal.     Breath sounds: Normal breath sounds.  Neurological:     Mental Status: She is alert.      Assessment/Plan:  Active Problems:   Myasthenia gravis ([place])   Ms. Czyzewski is a 85 year old F with the past medical history of myasthenia gravis, dementia, HTN, and breast cancer (s/p mastectomy & tamoxifen tx in 37s) who presented with progressive generalized weakness, dysphagia, and dysarthria for approximately 1 month.    Dysphagia concerning for Myasthenia gravis exacerbation   Initial stroke work-up negative. Patient received aspirin and a dose of methylprednisolone in the ED. Pt was evaluated by neuro who felt her presentation to be related to an exacerbation of her underlying myasthenia gravis. Takes pyridostigmine 60 mg PO 4 times daily at home.  Neurology consulted, appreciate their recommendations             - MRI negative, no need for secondary stroke work-up             - continue home pyridostigmine 60 mg 4 times daily             - start prednisone 20mg        - EEG ordered             - May consider IVIG if pt is unable to swallow  Pt failed bedside swallow evaluation, will await formal speech eval and change PO meds. - pyridostigmine 2mg  IM [id number] - methylprednisolone 16mg  IV daily - continue aspirin 81 mg daily - PT/OT/SLP evaluations ordered   Diet: NPO, sips with meds Fluids: LR 75 cc/hr  DVT ppx: enoxaparin 40mg  subQ daily CODE STATUS: DNR  Dispo: Anticipated discharge pending clinical course.   Ladona Horns, MD 08/03/2019, 6:51 AM Pager: [phone]

## 2019-08-03 NOTE — Evaluation (Signed)
Clinical/Bedside Swallow Evaluation Patient Details  Name: Elaine Owens MRN: [id number] Date of Birth: [birth year]  Today's Date: 08/03/2019 Time: SLP Start Time (ACUTE ONLY): [id number] SLP Stop Time (ACUTE ONLY): 0933 SLP Time Calculation (min) (ACUTE ONLY): 11 min  Past Medical History:  Past Medical History:  Diagnosis Date  . Arthritis   . Complication of anesthesia    hard to wake up  . Hypertension   . Immune deficiency disorder ([place])   . lt breast ca dx'd 1978 or 1988 pt unsure   lt masectomy; tamoxifen completed  . Myasthenia gravis [facility])    Past Surgical History:  Past Surgical History:  Procedure Laterality Date  . ABDOMINAL HYSTERECTOMY    . APPENDECTOMY    . BREAST SURGERY    . ESOPHAGOGASTRODUODENOSCOPY (EGD) WITH PROPOFOL N/A 10/12/2018   Procedure: ESOPHAGOGASTRODUODENOSCOPY (EGD) WITH PROPOFOL;  Surgeon: Otis Brace, MD;  Location: [facility];  Service: Gastroenterology;  Laterality: N/A;  . EYE SURGERY     lens implant  . HEMORRHOID SURGERY    . masectomny Left    1978  . MASTECTOMY     HPI:  Ms. Elaine Owens is a 85 y.o female with the past medical history of myasthenia gravis, dementia, HTN, and breast cancer (s/p mastectomy & tamoxifen tx in 1980s), and hearing loss who presented with dysphagia and dysarthria. The patient states her symptoms started about 1 month ago. She noticed she was having difficulty chewing and swallowing foods. She tried chewing for longer periods of time, and tried eating softer foods like scrambled eggs but this did not seem to help. She noted having difficulty swallowing both solid and liquid foods. She denied experiencing fatigue with prolonged chewing. However, she did notice general weakness that has increased over the past month.  When questioned on weakness with repetitive motions, the patient states she did has not noticed, but does think she would get very tired if she tried walking upstairs. MRI 11/16: no acute findings.  CXR  11/15: no active disease  Assessment / Plan / Recommendation Clinical Impression  Pt presents with functional swallowing as assessed clinically.  Pt tolerated all consistencies trialed with no clincial s/s of aspiration and exhibited good oral clearance of solids. Pt reports recent difficulty with swallowing and endorses coughing/choking and food/drink going down the wong way. States that solids and liquids have been equally difficult.  Pt note improvement today and stated last night she was unable to get applesauce down but was very much enjoying it this morning.  If swallowing difficulties are related to MG exacerbation, it may be that treatment has improved function.  Discussed diet preference with pt, who would be happy with regular texture diet.  Recommend regular diet with thin liquid.  If there is a decline in function, please reconsult ST and make pt NPO.  SLP to follow for diet tolerance. SLP Visit Diagnosis: Dysphagia, unspecified (R13.10)    Aspiration Risk  No limitations    Diet Recommendation Regular;Thin liquid   Liquid Administration via: Cup;Straw Medication Administration: Whole meds with liquid Supervision: Patient able to self feed Compensations: Slow rate;Small sips/bites Postural Changes: Seated upright at 90 degrees    Other  Recommendations Oral Care Recommendations: Oral care BID   Follow up Recommendations None      Frequency and Duration min 2x/week  2 weeks       Prognosis Prognosis for Safe Diet Advancement: Good      Swallow Study   General Date of Onset: 08/02/19 HPI: Ms.  Elaine Owens is a 85 y.o female with the past medical history of myasthenia gravis, dementia, HTN, and breast cancer (s/p mastectomy & tamoxifen tx in 1980s), and hearing loss who presented with dysphagia and dysarthria. The patient states her symptoms started about 1 month ago. She noticed she was having difficulty chewing and swallowing foods. She tried chewing for longer periods of  time, and tried eating softer foods like scrambled eggs but this did not seem to help. She noted having difficulty swallowing both solid and liquid foods. She denied experiencing fatigue with prolonged chewing. However, she did notice general weakness that has increased over the past month.  When questioned on weakness with repetitive motions, the patient states she did has not noticed, but does think she would get very tired if she tried walking upstairs. Type of Study: Bedside Swallow Evaluation Previous Swallow Assessment: None Diet Prior to this Study: NPO Temperature Spikes Noted: No Respiratory Status: Room air History of Recent Intubation: No Behavior/Cognition: Alert;Cooperative;Pleasant mood Oral Cavity Assessment: Within Functional Limits Oral Care Completed by SLP: No Oral Cavity - Dentition: Dentures, top;Dentures, bottom Vision: Functional for self-feeding Self-Feeding Abilities: Able to feed self Patient Positioning: Upright in bed Baseline Vocal Quality: Normal Volitional Cough: Strong Volitional Swallow: Able to elicit    Oral/Motor/Sensory Function Overall Oral Motor/Sensory Function: Within functional limits Facial ROM: Within Functional Limits Facial Symmetry: Within Functional Limits Lingual ROM: Within Functional Limits Lingual Symmetry: Within Functional Limits Lingual Strength: Within Functional Limits Velum: Within Functional Limits Mandible: Within Functional Limits   Ice Chips Ice chips: Not tested   Thin Liquid Thin Liquid: Within functional limits Presentation: Cup;Straw    Nectar Thick Nectar Thick Liquid: Not tested   Honey Thick Honey Thick Liquid: Not tested   Puree Puree: Within functional limits Presentation: Spoon   Solid     Solid: Within functional limits Presentation: [place] , [place], [place] Office: [phone]; Pager [phone]): [phone] (by text page only) 08/03/2019,10:29  AM

## 2019-08-03 NOTE — ED Notes (Signed)
ED TO INPATIENT HANDOFF REPORT  ED Nurse Name and Phone #: Levada Dy [phone]  S Name/Age/Gender Elaine Owens 85 y.o. female Room/Bed: 020C/020C  Code Status   Code Status: DNR  Home/SNF/Other Home Patient oriented to: self, place, time and situation Is this baseline? Yes   Triage Complete: Triage complete  Chief Complaint slurred speech   Triage Note Report from [facility]> pt from home. Seen at [facility] this morning for trouble swallowing x 1 week.  D/c home with flare-up of myasthenia gravis.  Pt incontinent of urine on the way home from hospital.  Family put her in shower when she got home and they report episode of slurred speech.  Per EMS, no slurred speech or neuro deficits on their arrival.  Family reports increased difficulty walking and lethargic over the last week.   Allergies Allergies  Allergen Reactions  . Ciprofloxacin Other (See Comments)    Can not take if patient has myastenia gravis  . Doxycycline Hyclate Other (See Comments)    Patient preference  . Morphine Nausea And Vomiting  . Morphine And Related Nausea And Vomiting    Mad her extremely sick    Level of Care/Admitting Diagnosis ED Disposition    ED Disposition Condition [place]: [place] [[id number]]  Level of Care: Progressive [102]  Admit to Progressive based on following criteria: NEUROLOGICAL AND NEUROSURGICAL complex patients with significant risk of instability, who do not meet ICU criteria, yet require close observation or frequent assessment (< / = every 2 - 4 hours) with medical / nursing intervention.  Covid Evaluation: Confirmed COVID Negative  Diagnosis: Myasthenia gravis [facility]) [id number]  Admitting Physician: Velna Ochs [id number]  Attending Physician: Velna Ochs [id number]  Estimated length of stay: past midnight tomorrow  Certification:: I certify this patient will need inpatient services for at least 2 midnights  PT Class (Do Not Modify):  Inpatient [101]  PT Acc Code (Do Not Modify): Private [1]       B Medical/Surgery History Past Medical History:  Diagnosis Date  . Arthritis   . Complication of anesthesia    hard to wake up  . Hypertension   . Immune deficiency disorder ([place])   . lt breast ca dx'd 1978 or 1988 pt unsure   lt masectomy; tamoxifen completed  . Myasthenia gravis [facility])    Past Surgical History:  Procedure Laterality Date  . ABDOMINAL HYSTERECTOMY    . APPENDECTOMY    . BREAST SURGERY    . ESOPHAGOGASTRODUODENOSCOPY (EGD) WITH PROPOFOL N/A 10/12/2018   Procedure: ESOPHAGOGASTRODUODENOSCOPY (EGD) WITH PROPOFOL;  Surgeon: Otis Brace, MD;  Location: [facility];  Service: Gastroenterology;  Laterality: N/A;  . EYE SURGERY     lens implant  . HEMORRHOID SURGERY    . masectomny Left    1978  . MASTECTOMY       A IV Location/Drains/Wounds Patient Lines/Drains/Airways Status   Active Line/Drains/Airways    Name:   Placement date:   Placement time:   Site:   Days:   Peripheral IV 08/02/19 Right Antecubital   08/02/19    1730    Antecubital   1   External Urinary Catheter   10/11/18    0700    -   296          Intake/Output Last 24 hours No intake or output data in the 24 hours ending 08/03/19 0342  Labs/Imaging Results for orders placed or performed during the hospital encounter of 08/02/19 (  from the past 48 hour(s))  Protime-INR     Status: None   Collection Time: 08/02/19  6:32 PM  Result Value Ref Range   Prothrombin Time 14.1 11.4 - 15.2 seconds    Comment: SPECIMEN CLOTTED MUMETT K, RN AT 1909 ON 08/02/2019 BY SAINVILUS S    INR 1.1 0.8 - 1.2    Comment: SPECIMEN CLOTTED MUMETT K, RN AT 1909 ON 08/02/2019 BY SAINVILUS S (NOTE) INR goal varies based on device and disease states. Performed at [facility], [place] [address], [place], [place] [address]   Comprehensive metabolic panel     Status: Abnormal   Collection Time: 08/02/19  6:32 PM  Result Value Ref Range    Sodium 137 135 - 145 mmol/L   Potassium 4.4 3.5 - 5.1 mmol/L   Chloride 105 98 - 111 mmol/L   CO2 17 (L) 22 - 32 mmol/L   Glucose, Bld 93 70 - 99 mg/dL   BUN 15 8 - 23 mg/dL   Creatinine, Ser 0.75 0.44 - 1.00 mg/dL   Calcium 9.4 8.9 - 10.3 mg/dL   Total Protein 6.7 6.5 - 8.1 g/dL   Albumin 3.9 3.5 - 5.0 g/dL   AST 41 15 - 41 U/L   ALT 29 0 - 44 U/L   Alkaline Phosphatase 48 38 - 126 U/L   Total Bilirubin 1.5 (H) 0.3 - 1.2 mg/dL   GFR calc non Af Amer >60 >60 mL/min   GFR calc Af Amer >60 >60 mL/min   Anion gap 15 5 - 15    Comment: Performed at [place] [address], [place], [place] [address]  Comprehensive metabolic panel     Status: Abnormal   Collection Time: 08/02/19  8:28 PM  Result Value Ref Range   Sodium 139 135 - 145 mmol/L   Potassium 4.2 3.5 - 5.1 mmol/L   Chloride 103 98 - 111 mmol/L   CO2 22 22 - 32 mmol/L   Glucose, Bld 110 (H) 70 - 99 mg/dL   BUN 14 8 - 23 mg/dL   Creatinine, Ser 0.73 0.44 - 1.00 mg/dL   Calcium 9.4 8.9 - 10.3 mg/dL   Total Protein 6.7 6.5 - 8.1 g/dL   Albumin 3.8 3.5 - 5.0 g/dL   AST 23 15 - 41 U/L   ALT 21 0 - 44 U/L   Alkaline Phosphatase 50 38 - 126 U/L   Total Bilirubin 0.7 0.3 - 1.2 mg/dL   GFR calc non Af Amer >60 >60 mL/min   GFR calc Af Amer >60 >60 mL/min   Anion gap 14 5 - 15    Comment: Performed at [facility], [address] [place], [place] [address]  TSH     Status: None   Collection Time: 08/02/19  8:58 PM  Result Value Ref Range   TSH 1.968 0.350 - 4.500 uIU/mL    Comment: Performed by a 3rd Generation assay with a functional sensitivity of <=0.01 uIU/mL. Performed at [facility], [place] [address] [place], [place] [address]   Urinalysis, Routine w reflex microscopic     Status: Abnormal   Collection Time: 08/02/19 10:05 PM  Result Value Ref Range   Color, Urine STRAW (A) YELLOW   APPearance CLEAR CLEAR   Specific Gravity, Urine 1.011 1.005 - 1.030   pH 6.0 5.0 - 8.0   Glucose, UA 150  (A) NEGATIVE mg/dL   Hgb urine dipstick SMALL (A) NEGATIVE  Bilirubin Urine NEGATIVE NEGATIVE   Ketones, ur 20 (A) NEGATIVE mg/dL   Protein, ur NEGATIVE NEGATIVE mg/dL   Nitrite NEGATIVE NEGATIVE   Leukocytes,Ua NEGATIVE NEGATIVE   RBC / HPF 0-5 0 - 5 RBC/hpf   WBC, UA 0-5 0 - 5 WBC/hpf   Bacteria, UA NONE SEEN NONE SEEN   Squamous Epithelial / LPF 0-5 0 - 5   Mucus PRESENT     Comment: Performed at [facility], [place] [address] [place], [place] [address]   Ct Head Wo Contrast  Result Date: 08/02/2019 CLINICAL DATA:  85 year old female with altered mental status. Difficulty swallowing and choking. Reports stroke 1 month ago. EXAM: CT HEAD WITHOUT CONTRAST TECHNIQUE: Contiguous axial images were obtained from the base of the skull through the vertex without intravenous contrast. COMPARISON:  Head CT 10/11/2018. FINDINGS: Brain: Stable cerebral volume. No midline shift, mass effect, or evidence of intracranial mass lesion. No ventriculomegaly. No acute intracranial hemorrhage identified. Chronic right superior cerebellar infarct is stable since January. Mild for age scattered cerebral white matter hypodensity appears stable. No acute or of all vein cortically based infarct identified, no supratentorial cortical encephalomalacia. Vascular: Calcified atherosclerosis at the skull base. No suspicious intracranial vascular hyperdensity. Skull: Hyperostosis.  No acute osseous abnormality identified. Sinuses/Orbits: Visualized paranasal sinuses and mastoids are stable and well pneumatized. Other: No acute orbit or scalp soft tissue findings. IMPRESSION: 1. No acute intracranial abnormality, no acute or subacute infarct identified by CT. 2. Chronic right cerebellum SCA territory infarct and mild for age cerebral white matter changes. Electronically Signed   By: Genevie Ann M.D.   On: 08/02/2019 21:03   Mr Brain Wo Contrast  Result Date: 08/03/2019 CLINICAL DATA:  85 year old female with altered mental  status, difficulty swallowing, choking. Reportedly status post stroke 1 month ago. EXAM: MRI HEAD WITHOUT CONTRAST TECHNIQUE: Multiplanar, multiecho pulse sequences of the brain and surrounding structures were obtained without intravenous contrast. COMPARISON:  Head CT earlier tonight. FINDINGS: Brain: Chronic right superior cerebellar artery infarct. No restricted diffusion or evidence of acute infarction. Widely scattered cerebral white matter T2 and FLAIR hyperintensity is moderate for age in a nonspecific configuration. No cerebral cortical encephalomalacia. No chronic cerebral blood products identified. The deep gray nuclei and brainstem are within normal limits for age. No midline shift, mass effect, evidence of mass lesion, ventriculomegaly, extra-axial collection or acute intracranial hemorrhage. Cervicomedullary junction and pituitary are within normal limits. Vascular: Major intracranial vascular flow voids are preserved. There is probably asymmetric slow flow in the left sigmoid sinus and IJ bulb. Skull and upper cervical spine: Degenerative ligamentous hypertrophy about the odontoid. Otherwise negative for age visible cervical spine. Normal bone marrow signal. Hyperostosis of the skull, normal variant. Sinuses/Orbits: Postoperative changes to both globes, otherwise negative orbits. Paranasal sinuses and mastoids are stable and well pneumatized. Other: Visible internal auditory structures appear normal. Scalp and face soft tissues appear negative. IMPRESSION: 1. No acute or subacute intracranial abnormality identified. 2. Chronic right cerebellar infarct (right SCA territory). Moderate for age cerebral white matter signal changes, most commonly due to chronic small vessel disease. Electronically Signed   By: Genevie Ann M.D.   On: 08/03/2019 00:13   Dg Chest Portable 1 View  Result Date: 08/02/2019 CLINICAL DATA:  Evaluate for aspiration pneumonia EXAM: PORTABLE CHEST 1 VIEW COMPARISON:  08/02/2019  FINDINGS: Heart and mediastinal contours are within normal limits. No focal opacities or effusions. No acute bony abnormality. IMPRESSION: No active disease. Electronically Signed  By: Rolm Baptise M.D.   On: 08/02/2019 21:22   Dg Chest Port 1 View  Result Date: 08/02/2019 CLINICAL DATA:  Dysphagia. EXAM: PORTABLE CHEST 1 VIEW COMPARISON:  01/08/2015 FINDINGS: Interval borderline enlarged cardiac silhouette. Aortic arch calcifications. Clear lungs. Diffuse osteopenia. Thoracic spine degenerative changes and mild scoliosis. Mild bilateral glenohumeral degenerative changes. IMPRESSION: 1. No acute abnormality. 2. Interval borderline cardiomegaly. 3. Aortic atherosclerosis. Electronically Signed   By: Claudie Revering M.D.   On: 08/02/2019 11:57    Pending Labs Unresulted Labs (From admission, onward)    Start     Ordered   08/03/19 0500  Comprehensive metabolic panel  Tomorrow morning,   R     08/02/19 2310   08/03/19 0500  CBC  Tomorrow morning,   R     08/02/19 2310   08/02/19 2239  SARS CORONAVIRUS 2 (TAT 6-24 HRS) Nasopharyngeal Nasopharyngeal Swab  (Asymptomatic/Tier 2 Patients Labs)  Once,   STAT    Question Answer Comment  Is this test for diagnosis or screening Screening   Symptomatic for COVID-19 as defined by CDC No   Hospitalized for COVID-19 No   Admitted to ICU for COVID-19 No   Previously tested for COVID-19 Yes   Resident in a congregate (group) care setting No   Employed in healthcare setting No   Pregnant No      08/02/19 2238   08/02/19 2100  CBC with Differential/Platelet  Once,   R     08/02/19 2100   08/02/19 1914  Protime-INR  Once,   R     08/02/19 1914   08/02/19 1914  APTT  Once,   R     08/02/19 1914          Vitals/Pain Today's Vitals   08/02/19 2246 08/03/19 0225 08/03/19 0230 08/03/19 0300  BP:  136/60 138/64 (!) 121/95  Pulse:  73 71   Resp:  (!) 22 19 (!) 22  Temp:      SpO2: 97% 92% 94%   Weight:      Height:      PainSc:        Isolation  Precautions No active isolations  Medications Medications  sodium chloride flush (NS) 0.9 % injection 3 mL (3 mLs Intravenous Not Given 08/03/19 0225)  enoxaparin (LOVENOX) injection 40 mg (has no administration in time range)  acetaminophen (TYLENOL) tablet 650 mg (has no administration in time range)    Or  acetaminophen (TYLENOL) suppository 650 mg (has no administration in time range)  pyridostigmine (MESTINON) tablet 60 mg (60 mg Oral Not Given 08/03/19 0143)  aspirin EC tablet 81 mg (has no administration in time range)    Or  aspirin suppository 150 mg (has no administration in time range)  lactated ringers infusion ( Intravenous New Bag/Given 08/03/19 0224)  predniSONE (DELTASONE) tablet 20 mg (has no administration in time range)  aspirin suppository 300 mg (300 mg Rectal Given 08/03/19 0221)    Mobility walks with person assist High fall risk   Focused Assessments Neuro Assessment Handoff:  Swallow screen pass? No    NIH Stroke Scale ( + Modified Stroke Scale Criteria)  Interval: Initial Level of Consciousness (1a.)   : Alert, keenly responsive LOC Questions (1b. )   +: Answers both questions correctly LOC Commands (1c. )   + : Performs both tasks correctly Best Gaze (2. )  +: Normal Visual (3. )  +: No visual loss Facial Palsy (4. )    :  Normal symmetrical movements Motor Arm, Left (5a. )   +: No drift Motor Arm, Right (5b. )   +: No drift Motor Leg, Left (6a. )   +: No drift Motor Leg, Right (6b. )   +: Drift Limb Ataxia (7. ): Absent Sensory (8. )   +: Normal, no sensory loss Best Language (9. )   +: No aphasia Dysarthria (10. ): Normal Extinction/Inattention (11.)   +: No Abnormality Modified SS Total  +: 1 Complete NIHSS TOTAL: 1     Neuro Assessment:   Neuro Checks:   Initial (08/02/19 2030)  Last Documented NIHSS Modified Score: 1 (08/02/19 2214) Has TPA been given? No If patient is a Neuro Trauma and patient is going to OR before floor call  report to [place] nurse: [phone] or [phone]     R Recommendations: See Admitting Provider Note  Report given to:   Additional Notes: alert oriented x 4 NIH 1

## 2019-08-03 NOTE — Evaluation (Signed)
Physical Therapy Evaluation Patient Details Name: Elaine Owens MRN: [id number] DOB: 1926/05/22 Today's Date: 08/03/2019   History of Present Illness  Ms. Hauptman is a 85 y.o female with the past medical history of myasthenia gravis, dementia, HTN, and breast cancer (s/p mastectomy & tamoxifen tx in 1980s), and hearing loss who presented with dysphagia and dysarthria. Initially presented to [facility] and went home and had further difficulty speaking and swallowing. MRI neg but showed chronic R cerebellar infarct.   Clinical Impression  Pt admitted with above diagnosis. Pt with significant STM deficits though may be close to her baseline, cannot retain new info for more than ~1 min. Has returned to min-guard A level with mobility. Was able to come to EOB independently and ambulated 200' with RW and min-guard A with stability. Vital signs stable.  Pt currently with functional limitations due to the deficits listed below (see PT Problem List). Pt will benefit from skilled PT to increase their independence and safety with mobility to allow discharge to the venue listed below.       Follow Up Recommendations Home health PT;Supervision for mobility/OOB    Equipment Recommendations  None recommended by PT    Recommendations for Other Services       Precautions / Restrictions Precautions Precautions: Fall Precaution Comments: decreased safety awareness Restrictions Weight Bearing Restrictions: No      Mobility  Bed Mobility Overal bed mobility: Needs Assistance Bed Mobility: Supine to Sit;Sit to Supine     Supine to sit: Min guard Sit to supine: Min guard   General bed mobility comments: min verbal cuing for hand placement and technique  Transfers Overall transfer level: Needs assistance Equipment used: Rolling walker (2 wheeled) Transfers: Risk manager;Sit to/from Stand Sit to Stand: Min guard Stand pivot transfers: Min guard       General transfer comment: min-guard  for safety, pt with sufficient strength for sit<>stand without physical assist  Ambulation/Gait Ambulation/Gait assistance: Min guard Gait Distance (Feet): 200 Feet Assistive device: Rolling walker (2 wheeled) Gait Pattern/deviations: Step-through pattern Gait velocity: decreased Gait velocity interpretation: 1.31 - 2.62 ft/sec, indicative of limited community ambulator General Gait Details: pt steady with use of RW, cues for safety, no LOB  Stairs            Wheelchair Mobility    Modified Rankin (Stroke Patients Only)       Balance Overall balance assessment: Needs assistance Sitting-balance support: Feet supported Sitting balance-Leahy Scale: Good     Standing balance support: During functional activity Standing balance-Leahy Scale: Fair Standing balance comment: use of RW                             Pertinent Vitals/Pain Pain Assessment: No/denies pain Faces Pain Scale: No hurt    Home Living Family/patient expects to be discharged to:: Private residence Living Arrangements: Children Available Help at Discharge: Available 24 hours/day Type of Home: House Home Access: Stairs to enter   [employer] of Steps: 1 Home Layout: Two level;Full bath on main level;Able to live on main level with bedroom/bathroom Home Equipment: Walker - 2 wheels Additional Comments: Pt reports owning RW only but chart says she also has SPC and shower seat. She reports that she gets into tub for baths prior to admission.    Prior Function Level of Independence: Independent with assistive device(s)         Comments: daughter not surrently working per chart. Pt  poor historian, some info from chart     Hand Dominance   Dominant Hand: Right    Extremity/Trunk Assessment   Upper Extremity Assessment Upper Extremity Assessment: Generalized weakness    Lower Extremity Assessment Lower Extremity Assessment: Generalized weakness    Cervical / Trunk  Assessment Cervical / Trunk Assessment: Kyphotic  Communication   Communication: No difficulties  Cognition Arousal/Alertness: Awake/alert Behavior During Therapy: WFL for tasks assessed/performed Overall Cognitive Status: Impaired/Different from baseline Area of Impairment: Safety/judgement;Awareness;Problem solving;Orientation                 Orientation Level: Disoriented to;Time Current Attention Level: Sustained Memory: Decreased short-term memory Following Commands: Follows one step commands consistently Safety/Judgement: Decreased awareness of safety;Decreased awareness of deficits Awareness: Emergent Problem Solving: Requires verbal cues General Comments: Pt also having visual hallucinations while therapist present and asking about "slugs on the wall"      General Comments      Exercises General Exercises - Lower Extremity Ankle Circles/Pumps: AROM;Both;10 reps;Supine   Assessment/Plan    PT Assessment Patient needs continued PT services  PT Problem List Decreased strength;Decreased balance;Decreased activity tolerance;Decreased mobility;Decreased knowledge of precautions       PT Treatment Interventions DME instruction;Gait training;Stair training;Functional mobility training;Therapeutic activities;Therapeutic exercise;Balance training;Neuromuscular re-education;Cognitive remediation;Patient/family education    PT Goals (Current goals can be found in the Care Plan section)  Acute Rehab PT Goals Patient Stated Goal: to go home PT Goal Formulation: Patient unable to participate in goal setting Time For Goal Achievement: 08/17/19 Potential to Achieve Goals: Good    Frequency Min 3X/week   Barriers to discharge        Co-evaluation               AM-PAC PT "6 Clicks" Mobility  Outcome Measure Help needed turning from your back to your side while in a flat bed without using bedrails?: None Help needed moving from lying on your back to sitting on  the side of a flat bed without using bedrails?: None Help needed moving to and from a bed to a chair (including a wheelchair)?: A Little Help needed standing up from a chair using your arms (e.g., wheelchair or bedside chair)?: A Little Help needed to walk in hospital room?: A Little Help needed climbing 3-5 steps with a railing? : A Lot 6 Click Score: 19    End of Session Equipment Utilized During Treatment: Gait belt Activity Tolerance: Patient tolerated treatment well Patient left: in bed;with call bell/phone within reach;with bed alarm set Nurse Communication: Mobility status PT Visit Diagnosis: Unsteadiness on feet (R26.81);Muscle weakness (generalized) (M62.81)    Time: [id number] PT Time Calculation (min) (ACUTE ONLY): 28 min   Charges:   PT Evaluation $PT Eval Moderate Complexity: 1 Mod PT Treatments $Gait Training: 8-22 mins        Leighton Roach, [place]  Pager [phone] Office [place] 08/03/2019, 1:19 PM

## 2019-08-03 NOTE — Progress Notes (Signed)
NIF -28, VC 1.8L with good patient effort.

## 2019-08-03 NOTE — Progress Notes (Signed)
  Date: 08/03/2019  Patient name: [place] record number: [id number]  Date of birth: [birth year]        I have seen and evaluated this patient and I have discussed the plan of care with the house staff. Please see their note for complete details. I concur with their findings with the following additions/corrections:   See my attested H&P from earlier today.   UPDATES: -- Given poor NIF and FVC, neurology has recommended IVIG x 5 days. Will start this today.  -- Appreciate SLP eval & recommendations. Regular diet with thin liquids ok. Can switch IV solumedrol to po prednisone.   Velna Ochs, MD 08/03/2019, 12:54 PM

## 2019-08-03 NOTE — Progress Notes (Signed)
EEG completed bedside full report to follow. °

## 2019-08-03 NOTE — Progress Notes (Signed)
Informed by CCM that patient had a 5 beat run of v-tach. Patient asymptomatic. MD notified and will continue to monitor.

## 2019-08-03 NOTE — Progress Notes (Signed)
Patient was able to preform NIF and VC.  NIF: -20 VC: 700

## 2019-08-03 NOTE — Progress Notes (Signed)
Patient admitted to 5w37 unit, room and routine. Information packet given to patient/family and call bell instructions were given. Patient ambulated to the restroom using walker (1 assist). Pt verbalizes an understanding of how to use the call bell and to call for help before getting out of bed.  Skin, clean-dry- intact with generalized actinic keratosis. No evidence of skin break down noted on exam.   Oneita Kras, RN

## 2019-08-03 NOTE — Evaluation (Signed)
Occupational Therapy Evaluation Patient Details Name: Elaine Owens MRN: [id number] DOB: [birth year] Today's Date: 08/03/2019    History of Present Illness Elaine Owens is a 85 y.o female with the past medical history of myasthenia gravis, dementia, HTN, and breast cancer (s/p mastectomy & tamoxifen tx in 1980s), and hearing loss who presented with dysphagia and dysarthria. Initially presented to [facility] and went home and had further difficulty speaking and swallowing. MRI neg but showed chronic R cerebellar infarct.    Clinical Impression   Patient presenting with decreased I in self care, balance, functional mobility/transfers, endurance, strength, safety awareness, and cognition.  Patient reports living with her daughter and being mod I PTA. Patient currently functioning at min guard - min A with AD. Patient will benefit from acute OT to increase overall independence in the areas of ADLs, functional mobility, and safety awareness in order to safely discharge home with caregiver.    Follow Up Recommendations  Home health OT;Supervision/Assistance - 24 hour    Equipment Recommendations  Tub/shower bench    Recommendations for Other Services Other (comment)(none)     Precautions / Restrictions Precautions Precautions: Fall Precaution Comments: decreased safety awareness Restrictions Weight Bearing Restrictions: No      Mobility Bed Mobility Overal bed mobility: Needs Assistance Bed Mobility: Supine to Sit;Sit to Supine     Supine to sit: Min guard Sit to supine: Min guard   General bed mobility comments: min verbal cuing for hand placement and technique  Transfers Overall transfer level: Needs assistance Equipment used: Rolling walker (2 wheeled) Transfers: Risk manager;Sit to/from Stand Sit to Stand: Min guard Stand pivot transfers: Min guard            Balance Overall balance assessment: Needs assistance Sitting-balance support: Feet supported Sitting  balance-Leahy Scale: Good     Standing balance support: During functional activity Standing balance-Leahy Scale: Fair Standing balance comment: use of RW        ADL either performed or assessed with clinical judgement   ADL Overall ADL's : Needs assistance/impaired Eating/Feeding: Set up;Sitting   Grooming: Wash/dry hands;Wash/dry face;Oral care;Applying deodorant;Brushing hair;Set up;Sitting   Upper Body Bathing: Set up;Sitting   Lower Body Bathing: Minimal assistance;Sit to/from stand   Upper Body Dressing : Set up;Sitting   Lower Body Dressing: Minimal assistance;Sit to/from stand   Toilet Transfer: Min guard;RW   Toileting- Water quality scientist and Hygiene: Minimal assistance;Sit to/from stand         General ADL Comments: min guard - min A for balance with functional mobility tasks     Vision Baseline Vision/History: Wears glasses Wears Glasses: At all times Patient Visual Report: No change from baseline Vision Assessment?: No apparent visual deficits            Pertinent Vitals/Pain Pain Assessment: No/denies pain Faces Pain Scale: No hurt     Hand Dominance Right   Extremity/Trunk Assessment Upper Extremity Assessment Upper Extremity Assessment: Generalized weakness   Lower Extremity Assessment Lower Extremity Assessment: Generalized weakness   Cervical / Trunk Assessment Cervical / Trunk Assessment: Kyphotic   Communication Communication Communication: No difficulties   Cognition Arousal/Alertness: Awake/alert Behavior During Therapy: WFL for tasks assessed/performed Overall Cognitive Status: Impaired/Different from baseline Area of Impairment: Safety/judgement;Awareness;Problem solving;Orientation     Orientation Level: Disoriented to;Time     Following Commands: Follows one step commands consistently Safety/Judgement: Decreased awareness of safety;Decreased awareness of deficits Awareness: Emergent Problem Solving: Requires verbal  cues General Comments: Pt also having visual hallucinations while therapist  present and asking about "slugs on the wall"              Home Living Family/patient expects to be discharged to:: Private residence Living Arrangements: Children Available Help at Discharge: Available 24 hours/day Type of Home: House Home Access: Stairs to enter [employer] of Steps: 1         Bathroom Shower/Tub: Teacher, early years/pre: Standard     Home Equipment: Environmental consultant - 2 wheels   Additional Comments: Pt reports owning RW only but chart says she also has [place] and shower seat. She reports that she gets into tub for baths prior to admission.      Prior Functioning/Environment Level of Independence: Independent with assistive device(s)                 OT Problem List: Decreased strength;Decreased knowledge of use of DME or AE;Decreased activity tolerance;Impaired balance (sitting and/or standing);Decreased safety awareness;Pain;Decreased cognition      OT Treatment/Interventions: Self-care/ADL training;Therapeutic exercise;Patient/family education;Balance training;Energy conservation;Therapeutic activities;DME and/or AE instruction;Cognitive remediation/compensation    OT Goals(Current goals can be found in the care plan section) Acute Rehab OT Goals Patient Stated Goal: to go home OT Goal Formulation: With patient Time For Goal Achievement: 08/17/19 Potential to Achieve Goals: Good ADL Goals Pt Will Perform Grooming: with supervision;standing Pt Will Perform Lower Body Bathing: with supervision Pt Will Perform Lower Body Dressing: with supervision Pt Will Transfer to Toilet: with supervision Pt Will Perform Toileting - Clothing Manipulation and hygiene: with supervision Pt Will Perform Tub/Shower Transfer: with supervision  OT Frequency: Min 2X/week   Barriers to D/C: Other (comment)(none known at this time)             AM-PAC OT "6 Clicks" Daily  Activity     Outcome Measure Help from another person eating meals?: A Little Help from another person taking care of personal grooming?: A Little Help from another person toileting, which includes using toliet, bedpan, or urinal?: A Lot Help from another person bathing (including washing, rinsing, drying)?: A Lot Help from another person to put on and taking off regular upper body clothing?: A Little Help from another person to put on and taking off regular lower body clothing?: A Lot 6 Click Score: 15   End of Session Equipment Utilized During Treatment: Rolling walker Nurse Communication: Mobility status;Precautions(hallucinations)  Activity Tolerance: Patient tolerated treatment well Patient left: in bed;with call bell/phone within reach;with bed alarm set  OT Visit Diagnosis: Muscle weakness (generalized) (M62.81);Unsteadiness on feet (R26.81)                Time: [id number] OT Time Calculation (min): 15 min Charges:  OT General Charges $OT Visit: 1 Visit OT Evaluation $OT Eval Low Complexity: 1 Low  Liem Copenhaver P, MS, OTR/L 08/03/2019, 12:40 PM

## 2019-08-03 NOTE — Progress Notes (Signed)
NIF: -25 VC: 1.5L best of three attempts with good pt effort.

## 2019-08-03 NOTE — Progress Notes (Signed)
Subjective: No complaints. The patient states that she has no dysphagia symptoms currently.  Objective: Current vital signs: BP (!) 121/95   Pulse 71   Temp 97.9 F (36.6 C) (Oral)   Resp (!) 22   Ht 5\' 1"  (1.549 m)   Wt 59 kg   SpO2 94%   BMI 24.56 kg/m  Vital signs in last 24 hours: Temp:  [97.6 F (36.4 C)-98 F (36.7 C)] 97.9 F (36.6 C) (11/16 0551) Pulse Rate:  [66-87] 71 (11/16 0230) Resp:  [14-22] 22 (11/16 0300) BP: (121-168)/(58-95) 121/95 (11/16 0300) SpO2:  [92 %-100 %] 94 % (11/16 0230) Weight:  [59 kg] 59 kg (11/15 1801)  Intake/Output from previous day: 11/15 0701 - 11/16 0700 In: 270 [I.V.:270] Out: -  Intake/Output this shift: No intake/output data recorded. Nutritional status:  Diet Order            Diet NPO time specified Except for: Sips with Meds  Diet effective now              Neurologic Exam: Ment: Awake and alert. Able to follow all commands and answer questions regarding her history.  CN: Fixates and tracks normally. EOMI without diplopia. Face symmetric. Has a transverse smile.  Motor: 4+/5 BUE. 5/5 BLE.  Sensory: Intact to FT x 4.  Cerebellar: No ataxia with FNF bilaterally Reflexes: 2+ bilateral brachioradialis and patellae  Lab Results: Results for orders placed or performed during the hospital encounter of 08/02/19 (from the past 48 hour(s))  Protime-INR     Status: None   Collection Time: 08/02/19  6:32 PM  Result Value Ref Range   Prothrombin Time 14.1 11.4 - 15.2 seconds    Comment: SPECIMEN CLOTTED MUMETT K, RN AT 1909 ON 08/02/2019 BY SAINVILUS S    INR 1.1 0.8 - 1.2    Comment: SPECIMEN CLOTTED MUMETT K, RN AT 1909 ON 08/02/2019 BY SAINVILUS S (NOTE) INR goal varies based on device and disease states. Performed at [facility], [place] [address], [place], [place] [address]   Comprehensive metabolic panel     Status: Abnormal   Collection Time: 08/02/19  6:32 PM  Result Value Ref Range   Sodium 137 135 - 145  mmol/L   Potassium 4.4 3.5 - 5.1 mmol/L   Chloride 105 98 - 111 mmol/L   CO2 17 (L) 22 - 32 mmol/L   Glucose, Bld 93 70 - 99 mg/dL   BUN 15 8 - 23 mg/dL   Creatinine, Ser 0.75 0.44 - 1.00 mg/dL   Calcium 9.4 8.9 - 10.3 mg/dL   Total Protein 6.7 6.5 - 8.1 g/dL   Albumin 3.9 3.5 - 5.0 g/dL   AST 41 15 - 41 U/L   ALT 29 0 - 44 U/L   Alkaline Phosphatase 48 38 - 126 U/L   Total Bilirubin 1.5 (H) 0.3 - 1.2 mg/dL   GFR calc non Af Amer >60 >60 mL/min   GFR calc Af Amer >60 >60 mL/min   Anion gap 15 5 - 15    Comment: Performed at [facility] [address] [place], [place] [address]  Comprehensive metabolic panel     Status: Abnormal   Collection Time: 08/02/19  8:28 PM  Result Value Ref Range   Sodium 139 135 - 145 mmol/L   Potassium 4.2 3.5 - 5.1 mmol/L   Chloride 103 98 - 111 mmol/L   CO2 22 22 - 32 mmol/L   Glucose, Bld 110 (  H) 70 - 99 mg/dL   BUN 14 8 - 23 mg/dL   Creatinine, Ser 0.73 0.44 - 1.00 mg/dL   Calcium 9.4 8.9 - 10.3 mg/dL   Total Protein 6.7 6.5 - 8.1 g/dL   Albumin 3.8 3.5 - 5.0 g/dL   AST 23 15 - 41 U/L   ALT 21 0 - 44 U/L   Alkaline Phosphatase 50 38 - 126 U/L   Total Bilirubin 0.7 0.3 - 1.2 mg/dL   GFR calc non Af Amer >60 >60 mL/min   GFR calc Af Amer >60 >60 mL/min   Anion gap 14 5 - 15    Comment: Performed at [facility], [place] [address] [place], [place] [address]  TSH     Status: None   Collection Time: 08/02/19  8:58 PM  Result Value Ref Range   TSH 1.968 0.350 - 4.500 uIU/mL    Comment: Performed by a 3rd Generation assay with a functional sensitivity of <=0.01 uIU/mL. Performed at [facility], [place] [address], [place], [place] [address]   Urinalysis, Routine w reflex microscopic     Status: Abnormal   Collection Time: 08/02/19 10:05 PM  Result Value Ref Range   Color, Urine STRAW (A) YELLOW   APPearance CLEAR CLEAR   Specific Gravity, Urine 1.011 1.005 - 1.030   pH 6.0 5.0 - 8.0   Glucose, UA 150 (A) NEGATIVE mg/dL    Hgb urine dipstick SMALL (A) NEGATIVE   Bilirubin Urine NEGATIVE NEGATIVE   Ketones, ur 20 (A) NEGATIVE mg/dL   Protein, ur NEGATIVE NEGATIVE mg/dL   Nitrite NEGATIVE NEGATIVE   Leukocytes,Ua NEGATIVE NEGATIVE   RBC / HPF 0-5 0 - 5 RBC/hpf   WBC, UA 0-5 0 - 5 WBC/hpf   Bacteria, UA NONE SEEN NONE SEEN   Squamous Epithelial / LPF 0-5 0 - 5   Mucus PRESENT     Comment: Performed at [facility] [address], [place], [place] [address]  Comprehensive metabolic panel     Status: Abnormal   Collection Time: 08/03/19  4:01 AM  Result Value Ref Range   Sodium 136 135 - 145 mmol/L   Potassium 4.2 3.5 - 5.1 mmol/L   Chloride 101 98 - 111 mmol/L   CO2 20 (L) 22 - 32 mmol/L   Glucose, Bld 126 (H) 70 - 99 mg/dL   BUN 15 8 - 23 mg/dL   Creatinine, Ser 0.70 0.44 - 1.00 mg/dL   Calcium 9.3 8.9 - 10.3 mg/dL   Total Protein 6.5 6.5 - 8.1 g/dL   Albumin 3.6 3.5 - 5.0 g/dL   AST 22 15 - 41 U/L   ALT 23 0 - 44 U/L   Alkaline Phosphatase 49 38 - 126 U/L   Total Bilirubin 0.6 0.3 - 1.2 mg/dL   GFR calc non Af Amer >60 >60 mL/min   GFR calc Af Amer >60 >60 mL/min   Anion gap 15 5 - 15    Comment: Performed at [facility] [address] [place] [address]  CBC     Status: None   Collection Time: 08/03/19  4:01 AM  Result Value Ref Range   WBC 6.1 4.0 - 10.5 K/uL   RBC 4.51 3.87 - 5.11 MIL/uL   Hemoglobin 14.2 12.0 - 15.0 g/dL   HCT 42.1 36.0 - 46.0 %   MCV 93.3 80.0 - 100.0 fL   MCH 31.5 26.0 - 34.0 pg  MCHC 33.7 30.0 - 36.0 g/dL   RDW 12.5 11.5 - 15.5 %   Platelets 206 150 - 400 K/uL   nRBC 0.0 0.0 - 0.2 %    Comment: Performed at [facility], [place] [address], [place], [place] [address]    Recent Results (from the past 240 hour(s))  SARS CORONAVIRUS 2 (TAT 6-24 HRS) Nasopharyngeal Nasopharyngeal Swab     Status: None   Collection Time: 08/02/19 12:00 PM   Specimen: Nasopharyngeal Swab  Result Value Ref Range Status   SARS Coronavirus 2 NEGATIVE NEGATIVE  Final    Comment: (NOTE) SARS-CoV-2 target nucleic acids are NOT DETECTED. The SARS-CoV-2 RNA is generally detectable in upper and lower respiratory specimens during the acute phase of infection. Negative results do not preclude SARS-CoV-2 infection, do not rule out co-infections with other pathogens, and should not be used as the sole basis for treatment or other patient management decisions. Negative results must be combined with clinical observations, patient history, and epidemiological information. The expected result is Negative. Fact Sheet for Patients: SugarRoll.be Fact Sheet for Healthcare Providers: https://www.woods-mathews.com/ This test is not yet approved or cleared by the [place] FDA and  has been authorized for detection and/or diagnosis of SARS-CoV-2 by FDA under an Emergency Use Authorization (EUA). This EUA will remain  in effect (meaning this test can be used) for the duration of the COVID-19 declaration under Section 56 4(b)(1) of the Act, 21 U.S.C. section 360bbb-3(b)(1), unless the authorization is terminated or revoked sooner. Performed at [facility], [place] [address] [place], [place] [address]     Lipid Panel No results for input(s): CHOL, TRIG, HDL, CHOLHDL, VLDL, LDLCALC in the last 72 hours.  Studies/Results: Ct Head Wo Contrast  Result Date: 08/02/2019 CLINICAL DATA:  85 year old female with altered mental status. Difficulty swallowing and choking. Reports stroke 1 month ago. EXAM: CT HEAD WITHOUT CONTRAST TECHNIQUE: Contiguous axial images were obtained from the base of the skull through the vertex without intravenous contrast. COMPARISON:  Head CT 10/11/2018. FINDINGS: Brain: Stable cerebral volume. No midline shift, mass effect, or evidence of intracranial mass lesion. No ventriculomegaly. No acute intracranial hemorrhage identified. Chronic right superior cerebellar infarct is stable since  January. Mild for age scattered cerebral white matter hypodensity appears stable. No acute or of all vein cortically based infarct identified, no supratentorial cortical encephalomalacia. Vascular: Calcified atherosclerosis at the skull base. No suspicious intracranial vascular hyperdensity. Skull: Hyperostosis.  No acute osseous abnormality identified. Sinuses/Orbits: Visualized paranasal sinuses and mastoids are stable and well pneumatized. Other: No acute orbit or scalp soft tissue findings. IMPRESSION: 1. No acute intracranial abnormality, no acute or subacute infarct identified by CT. 2. Chronic right cerebellum SCA territory infarct and mild for age cerebral white matter changes. Electronically Signed   By: Genevie Ann M.D.   On: 08/02/2019 21:03   Mr Brain Wo Contrast  Result Date: 08/03/2019 CLINICAL DATA:  85 year old female with altered mental status, difficulty swallowing, choking. Reportedly status post stroke 1 month ago. EXAM: MRI HEAD WITHOUT CONTRAST TECHNIQUE: Multiplanar, multiecho pulse sequences of the brain and surrounding structures were obtained without intravenous contrast. COMPARISON:  Head CT earlier tonight. FINDINGS: Brain: Chronic right superior cerebellar artery infarct. No restricted diffusion or evidence of acute infarction. Widely scattered cerebral white matter T2 and FLAIR hyperintensity is moderate for age in a nonspecific configuration. No cerebral cortical encephalomalacia. No chronic cerebral blood products identified. The deep gray nuclei and brainstem are within normal limits  for age. No midline shift, mass effect, evidence of mass lesion, ventriculomegaly, extra-axial collection or acute intracranial hemorrhage. Cervicomedullary junction and pituitary are within normal limits. Vascular: Major intracranial vascular flow voids are preserved. There is probably asymmetric slow flow in the left sigmoid sinus and IJ bulb. Skull and upper cervical spine: Degenerative ligamentous  hypertrophy about the odontoid. Otherwise negative for age visible cervical spine. Normal bone marrow signal. Hyperostosis of the skull, normal variant. Sinuses/Orbits: Postoperative changes to both globes, otherwise negative orbits. Paranasal sinuses and mastoids are stable and well pneumatized. Other: Visible internal auditory structures appear normal. Scalp and face soft tissues appear negative. IMPRESSION: 1. No acute or subacute intracranial abnormality identified. 2. Chronic right cerebellar infarct (right SCA territory). Moderate for age cerebral white matter signal changes, most commonly due to chronic small vessel disease. Electronically Signed   By: Genevie Ann M.D.   On: 08/03/2019 00:13   Dg Chest Portable 1 View  Result Date: 08/02/2019 CLINICAL DATA:  Evaluate for aspiration pneumonia EXAM: PORTABLE CHEST 1 VIEW COMPARISON:  08/02/2019 FINDINGS: Heart and mediastinal contours are within normal limits. No focal opacities or effusions. No acute bony abnormality. IMPRESSION: No active disease. Electronically Signed   By: Rolm Baptise M.D.   On: 08/02/2019 21:22   Dg Chest Port 1 View  Result Date: 08/02/2019 CLINICAL DATA:  Dysphagia. EXAM: PORTABLE CHEST 1 VIEW COMPARISON:  01/08/2015 FINDINGS: Interval borderline enlarged cardiac silhouette. Aortic arch calcifications. Clear lungs. Diffuse osteopenia. Thoracic spine degenerative changes and mild scoliosis. Mild bilateral glenohumeral degenerative changes. IMPRESSION: 1. No acute abnormality. 2. Interval borderline cardiomegaly. 3. Aortic atherosclerosis. Electronically Signed   By: Claudie Revering M.D.   On: 08/02/2019 11:57    Medications:  Scheduled: . aspirin EC  81 mg Oral Daily   Or  . aspirin  150 mg Rectal Daily  . enoxaparin (LOVENOX) injection  40 mg Subcutaneous Q24H  . methylPREDNISolone (SOLU-MEDROL) injection  16 mg Intravenous Daily   Or  . predniSONE  20 mg Oral Daily  . pyridostigmine  2 mg Intramuscular Q6H   Or  .  pyridostigmine  60 mg Oral Q6H  . sodium chloride flush  3 mL Intravenous Once   Continuous: . lactated ringers 75 mL/hr at 08/03/19 0224    MRI brain: 1. No acute or subacute intracranial abnormality identified. 2. Chronic right cerebellar infarct (right SCA territory). Moderate for age cerebral white matter signal changes, most commonly due to chronic small vessel disease.  Assessment: 85 year old female with increasing dysphagia over the past week.  Also with diplopia during upgaze and transverse smile. There is a significant possibility that the dysphagia is related to her known myasthenia.  1. MRI shows no acute stroke. A chronic right cerebellar infarct is seen.  2. Possible mild myasthenia gravis exacerbation 3. Also with possible seizure-like episode at home during which she had an episode of incontinence while walking, stating  "I just peed on myself." She was apparently aware of it happening at the time. She then made a choking sound and had trouble getting words out. More likely the difficulty speaking was due to choking, but will obtain EEG to assess for possible epileptiform abnormality.   Recommendations: 1. EEG pending. 2. Continue ASA for stroke prophylaxis 3. Given poor NIF and FVC of 700 cc today, would start IVIG 0.4 g/kg qd x 5 days 4. Continue Mestinon    LOS: 1 day   @Electronically  signed: Dr. Kerney Elbe 08/03/2019  8:30 AM

## 2019-08-04 DIAGNOSIS — G7000 Myasthenia gravis without (acute) exacerbation: Secondary | ICD-10-CM

## 2019-08-04 DIAGNOSIS — R410 Disorientation, unspecified: Secondary | ICD-10-CM

## 2019-08-04 LAB — GLUCOSE, CAPILLARY: Glucose-Capillary: 110 mg/dL — ABNORMAL HIGH (ref 70–99)

## 2019-08-04 NOTE — Progress Notes (Signed)
NIF -25 and VC 2.3 with good patient effort.

## 2019-08-04 NOTE — Progress Notes (Signed)
  Speech Language Pathology Treatment: Dysphagia  Patient Details Name: Elaine Owens MRN: [id number] DOB: 13-Sep-1926 Today's Date: 08/04/2019 Time: 1031-1040 SLP Time Calculation (min) (ACUTE ONLY): 9 min  Assessment / Plan / Recommendation Clinical Impression  Pt seen for ongoing dysphagia management. Pt is tolerating regular solid and thin liquid without clinical s/s of aspiration.  Pt continues to exhibit good oral clearance of solids from oral cavity.  Daughter reports pt has not had symptoms previously and this is her first exacerbation. Pt receiving IVIG for MG.  Pt has no further ST needs at this time.  SLP will sign off.     HPI HPI: Elaine Owens is a 85 y.o female with the past medical history of myasthenia gravis, dementia, HTN, and breast cancer (s/p mastectomy & tamoxifen tx in 1980s), and hearing loss who presented with dysphagia and dysarthria. The patient states her symptoms started about 1 month ago. She noticed she was having difficulty chewing and swallowing foods. She tried chewing for longer periods of time, and tried eating softer foods like scrambled eggs but this did not seem to help. She noted having difficulty swallowing both solid and liquid foods. She denied experiencing fatigue with prolonged chewing. However, she did notice general weakness that has increased over the past month.  When questioned on weakness with repetitive motions, the patient states she did has not noticed, but does think she would get very tired if she tried walking upstairs.      SLP Plan  All goals met;Discharge SLP treatment due to (comment)       Recommendations  Diet recommendations: Regular;Thin liquid Liquids provided via: Cup;Straw Medication Administration: Whole meds with liquid Supervision: Patient able to self feed Compensations: Slow rate;Small sips/bites Postural Changes and/or Swallow Maneuvers: Seated upright 90 degrees                Oral Care Recommendations: Oral  care BID Follow up Recommendations: None SLP Visit Diagnosis: Dysphagia, unspecified (R13.10) Plan: All goals met;Discharge SLP treatment due to (comment)       [place] , [place], [place] Office: [phone]; Pager (11/17): [phone] (by text page only) 08/04/2019, 10:52 AM

## 2019-08-04 NOTE — TOC Initial Note (Signed)
Transition of Care [facility]) - Initial/Assessment Note    Patient Details  Name: Elaine Owens MRN: [id number] Date of Birth: 07-Nov-1925  [facility] CM/SW Contact:    Sharin Mons, RN Phone Number: 08/04/2019, 4:33 PM  Clinical Narrative:                 Presents with dysphagia and dysarthria. Hx of myasthenia gravis, dementia, HTN, and breast cancer (s/p mastectomy). Resides with daughter [name] . DME : BSC , walker .    Emirah Lozowski (Daughter)     [phone]      NCM spoke with pt @ bedside regarding d/c planning. Pt requested NCM speak with daughter [name]. NCM called daughter [name] and discussed d/c planning. Shared PT/OT recommendations. Daughter agreeable to home health services. Choice offered and [facility] selected. Referral made with [facility] pending MD's orders.... Daughter requesting shower chair with d/c. TOC team will continue to monitor for needs.....  Expected Discharge Plan: [place] Barriers to Discharge: Continued Medical Work up   Patient Goals and CMS Choice        Expected Discharge Plan and Services Expected Discharge Plan: [place]   Discharge Planning Services: CM Consult   Living arrangements for the past 2 months: Single Family Home                                      Prior Living Arrangements/Services Living arrangements for the past 2 months: Single Family Home                     Activities of Daily Living Home Assistive Devices/Equipment: Environmental consultant (specify type) ADL Screening (condition at time of admission) Patient's cognitive ability adequate to safely complete daily activities?: No Is the patient deaf or have difficulty hearing?: Yes Does the patient have difficulty seeing, even when wearing glasses/contacts?: Yes Does the patient have difficulty concentrating, remembering, or making decisions?: No Patient able to express need for assistance with ADLs?: Yes Does the patient  have difficulty dressing or bathing?: Yes Independently performs ADLs?: Yes (appropriate for developmental age) Does the patient have difficulty walking or climbing stairs?: No Weakness of Legs: Both Weakness of Arms/Hands: Both  Permission Sought/Granted                  Emotional Assessment              Admission diagnosis:  Myasthenia gravis ([place]) [G70.00] Dysphagia, unspecified type [R13.10] Patient Active Problem List   Diagnosis Date Noted  . Acute delirium   . Dysphagia   . Symptomatic anemia 10/11/2018  . GIB (gastrointestinal bleeding) 10/11/2018  . Anemia due to acute blood loss   . Upper airway cough syndrome vs cough variant asthma 03/02/2015  . Essential hypertension 07/15/2013  . Generalized weakness 03/20/2013  . Acute renal failure ([place]) 03/20/2013  . Myasthenia gravis ([place]) 03/20/2013  . UTI (urinary tract infection) 03/20/2013   PCP:  Jettie Booze, NP Pharmacy:   [facility] [address] [place] - [address] [place] [address] Phone: [phone] Fax: [phone]  [facility] [address], [place] - [place] [address] Phone: [phone] Fax: [phone]     Social Determinants of Health (SDOH) Interventions    Readmission Risk Interventions No  flowsheet data found.

## 2019-08-04 NOTE — Progress Notes (Signed)
  Date: 08/04/2019  Patient name: [place] record number: [id number]  Date of birth: 09-13-1926        I have seen and evaluated this patient and I have discussed the plan of care with the house staff. Please see their note for complete details. I concur with their findings.  Velna Ochs, MD 08/04/2019, 12:24 PM

## 2019-08-04 NOTE — Progress Notes (Signed)
   Subjective: Pt seen at the bedside this morning. Finishing up therapy with OT. Daughter, [name], in the room. She endorses that the pt's confusion is acute in nature and pt's baseline at home is quite functional and oriented. Questions and concerns regarding MG and delirium addressed, explain it is likely due to steroids and hospital stay and may take time to clear.   Objective:  Vital signs in last 24 hours: Vitals:   08/04/19 0029 08/04/19 0300 08/04/19 0450 08/04/19 0452  BP: (!) 108/47 (!) 98/47 96/79   Pulse:  (!) 59 (!) 112   Resp:  18 16   Temp: 98 F (36.7 C)   97.8 F (36.6 C)  TempSrc: Oral   Oral  SpO2:  93% 92%   Weight:      Height:       Physical Exam Constitutional:      General: She is not in acute distress.    Comments: Frail.  Eyes:     Extraocular Movements: Extraocular movements intact.     Comments: Denies diplopia with prolonged extraocular movements   Cardiovascular:     Rate and Rhythm: Normal rate and regular rhythm.  Pulmonary:     Effort: Pulmonary effort is normal. No respiratory distress.     Breath sounds: Normal breath sounds.  Neurological:     Mental Status: She is alert.    Assessment/Plan:  Active Problems:   Myasthenia gravis ([place])   Dysphagia   Ms. Venturino is a 85 year old F with the past medical history of myasthenia gravis,dementia,HTN, and breast cancer(s/p mastectomy & tamoxifen tx in 1980s) who presented withprogressive generalized weakness,dysphagia, and dysarthriafor approximately 1 month.   Dysphagia concerning for Myasthenia gravis exacerbation  Initial stroke work-up negative. Takes pyridostigmine 60 mg PO 4 times daily at home.  NIF and FVC poor yesterday at -20 and 700cc respectively. Appear somewhat improved, most recent NIF -36 and FVC 1.9L this morning.  Neurology consulted, appreciate their recommendations - continue IVIG day 2/5 - continue home pyridostigmine 60mg  4 times daily  - continue prednisone 20mg    - EEG suggestive of mild to moderate diffuse encephalopathy, no epileptiform discharges seen - formal speech eval recommending regular diet with thin liquids - PT/OT recommending home health - continue aspirin 81 mg daily   Acute delirium Pt confused, telling her daughter that she is in school, or with relatives who have pasted - worsened by hospital stay and steroid medications - will continue to monitor   Diet:regular Fluids:none DVT ppx: enoxaparin 40mg  subQdaily CODE STATUS: DNR  Dispo: Anticipated discharge pending clinical course.  Ladona Horns, MD 08/04/2019, 6:27 AM Pager: [phone]

## 2019-08-04 NOTE — Progress Notes (Signed)
NIF -36, VC 1.9L with good patient effort.

## 2019-08-04 NOTE — Progress Notes (Signed)
Occupational Therapy Treatment Patient Details Name: Elaine Owens MRN: [id number] DOB: 09/24/1925 Today's Date: 08/04/2019    History of present illness Ms. Fassler is a 85 y.o female with the past medical history of myasthenia gravis, dementia, HTN, and breast cancer (s/p mastectomy & tamoxifen tx in 1980s), and hearing loss who presented with dysphagia and dysarthria. Initially presented to [facility] and went home and had further difficulty speaking and swallowing. MRI neg but showed chronic R cerebellar infarct.    OT comments  Daughter present in room during this session. Pt continues to have hallucinations while in room and reports bugs being on her covers. Pt verbalizing need for toileting and ambulates with min cuing for hand placement and min guard for balance into bathroom. Pt needing min guard while standing for clothing management and hygiene. Pt standing at sink for 5 minutes for grooming tasks with close supervision. Pt ambulating to recliner chair when several staff members arrive to assess pt and provide education to caregiver. Pt seated in recliner chair with chair alarm donned for safety. PT continues to benefit from OT intervention.    Follow Up Recommendations  Home health OT;Supervision/Assistance - 24 hour    Equipment Recommendations  Tub/shower bench    Recommendations for Other Services      Precautions / Restrictions Precautions Precautions: Fall Precaution Comments: decreased safety awareness Restrictions Weight Bearing Restrictions: No       Mobility Bed Mobility Overal bed mobility: Needs Assistance Bed Mobility: Supine to Sit;Sit to Supine     Supine to sit: Min guard     General bed mobility comments: min verbal cuing for hand placement and technique  Transfers Overall transfer level: Needs assistance Equipment used: Rolling walker (2 wheeled) Transfers: Risk manager;Sit to/from Stand Sit to Stand: Min guard Stand pivot transfers: Min  guard       General transfer comment: min-guard for safety, pt with sufficient strength for sit<>stand without physical assist    Balance Overall balance assessment: Needs assistance Sitting-balance support: Feet supported Sitting balance-Leahy Scale: Good     Standing balance support: During functional activity Standing balance-Leahy Scale: Fair Standing balance comment: use of RW           ADL either performed or assessed with clinical judgement   ADL Overall ADL's : Needs assistance/impaired     Grooming: Wash/dry hands;Oral care;Min guard;Standing      Toilet Transfer: Min guard;RW   Toileting- Water quality scientist and Hygiene: Min guard;Sit to/from stand         General ADL Comments: min guard overall with standing balance and min cuing for safety awareness     Vision Baseline Vision/History: Wears glasses Wears Glasses: At all times Patient Visual Report: No change from baseline            Cognition Arousal/Alertness: Awake/alert Behavior During Therapy: WFL for tasks assessed/performed Overall Cognitive Status: History of cognitive impairments - at baseline Area of Impairment: Memory;Safety/judgement;Awareness;Problem solving      Memory: Decreased short-term memory Following Commands: Follows one step commands consistently Safety/Judgement: Decreased awareness of safety;Decreased awareness of deficits Awareness: Emergent Problem Solving: Requires verbal cues General Comments: Pt's daughter present in the room and reports that she has dementia and difficulty with memory at home but cognition appears much more different than her "normal"                   Pertinent Vitals/ Pain       Pain Assessment: No/denies pain Faces Pain  Scale: No hurt         Frequency  Min 2X/week        Progress Toward Goals  OT Goals(current goals can now be found in the care plan section)  Progress towards OT goals: Progressing toward goals  Acute  Rehab OT Goals Patient Stated Goal: to go home OT Goal Formulation: With patient Time For Goal Achievement: 08/18/19 Potential to Achieve Goals: Good  Plan Discharge plan remains appropriate       AM-PAC OT "6 Clicks" Daily Activity     Outcome Measure   Help from another person eating meals?: A Little Help from another person taking care of personal grooming?: A Little Help from another person toileting, which includes using toliet, bedpan, or urinal?: A Little Help from another person bathing (including washing, rinsing, drying)?: A Little   Help from another person to put on and taking off regular lower body clothing?: A Little 6 Click Score: 15    End of Session Equipment Utilized During Treatment: Rolling walker  OT Visit Diagnosis: Muscle weakness (generalized) (M62.81);Unsteadiness on feet (R26.81)   Activity Tolerance Patient tolerated treatment well   Patient Left in chair;with call bell/phone within reach;with chair alarm set;with family/visitor present           Time: [id number] OT Time Calculation (min): 18 min  Charges: OT General Charges $OT Visit: 1 Visit OT Treatments $Self Care/Home Management : 8-22 mins  Gypsy Decant MS, OTR/L 08/04/2019, 11:57 AM

## 2019-08-04 NOTE — Progress Notes (Signed)
Physical Therapy Treatment Patient Details Name: Elaine Owens MRN: [id number] DOB: Apr 20, 1926 Today's Date: 08/04/2019    History of Present Illness Ms. Ko is a 85 y.o female with the past medical history of myasthenia gravis, dementia, HTN, and breast cancer (s/p mastectomy & tamoxifen tx in 1980s), and hearing loss who presented with dysphagia and dysarthria. Initially presented to [facility] and went home and had further difficulty speaking and swallowing. MRI neg but showed chronic R cerebellar infarct.     PT Comments    Pt ambulated 200' with RW and min-guard A, fatigues after first 100' and slower return to room with seated rest needed before continuing with exercises. Pt with very poor short term recall, unsure how close this is to her baseline dementia. Follows simple, one step commands but repeats same questions every few minutes and cannot remember who she lives with or what she was doing before admission. PT will continue to follow.   Follow Up Recommendations  Home health PT;Supervision for mobility/OOB     Equipment Recommendations  None recommended by PT    Recommendations for Other Services       Precautions / Restrictions Precautions Precautions: Fall Precaution Comments: decreased safety awareness Restrictions Weight Bearing Restrictions: No    Mobility  Bed Mobility Overal bed mobility: Needs Assistance Bed Mobility: Supine to Sit;Sit to Supine     Supine to sit: Supervision Sit to supine: Supervision   General bed mobility comments: pt able to get out and back into bed with supervision for safety but physical assist needed  Transfers Overall transfer level: Needs assistance Equipment used: Rolling walker (2 wheeled) Transfers: Sit to/from Stand Sit to Stand: Min guard         General transfer comment: vc's for hand placement  Ambulation/Gait Ambulation/Gait assistance: Min guard Gait Distance (Feet): 200 Feet Assistive device: Rolling  walker (2 wheeled) Gait Pattern/deviations: Step-through pattern;Decreased stride length Gait velocity: decreased Gait velocity interpretation: <1.8 ft/sec, indicate of risk for recurrent falls General Gait Details: pt fatigues at 100' and requests to return to room, decreased cadence on return and pt needed seated rest berak before performing exercises.    Stairs             Wheelchair Mobility    Modified Rankin (Stroke Patients Only)       Balance Overall balance assessment: Needs assistance Sitting-balance support: Feet supported Sitting balance-Leahy Scale: Good     Standing balance support: During functional activity Standing balance-Leahy Scale: Fair Standing balance comment: use of RW                            Cognition Arousal/Alertness: Awake/alert Behavior During Therapy: WFL for tasks assessed/performed Overall Cognitive Status: History of cognitive impairments - at baseline Area of Impairment: Memory;Safety/judgement;Awareness;Problem solving                 Orientation Level: Disoriented to;Time Current Attention Level: Sustained Memory: Decreased short-term memory Following Commands: Follows one step commands consistently Safety/Judgement: Decreased awareness of safety;Decreased awareness of deficits Awareness: Emergent Problem Solving: Requires verbal cues General Comments: pt cannot remember which of her daughters that she lives with or anyone else who lives there      Exercises General Exercises - Lower Extremity Ankle Circles/Pumps: AROM;Both;10 reps;Seated Long Arc Quad: AROM;Both;10 reps;Seated Hip Flexion/Marching: AROM;Both;10 reps;Seated Heel Raises: AROM;Both;10 reps;Seated    General Comments        Pertinent Vitals/Pain Pain Assessment: No/denies  pain    Home Living                      Prior Function            PT Goals (current goals can now be found in the care plan section) Acute Rehab PT  Goals Patient Stated Goal: to go home PT Goal Formulation: Patient unable to participate in goal setting Time For Goal Achievement: 08/17/19 Potential to Achieve Goals: Good Progress towards PT goals: Progressing toward goals    Frequency    Min 3X/week      PT Plan Current plan remains appropriate    Co-evaluation              AM-PAC PT "6 Clicks" Mobility   Outcome Measure  Help needed turning from your back to your side while in a flat bed without using bedrails?: None Help needed moving from lying on your back to sitting on the side of a flat bed without using bedrails?: None Help needed moving to and from a bed to a chair (including a wheelchair)?: A Little Help needed standing up from a chair using your arms (e.g., wheelchair or bedside chair)?: A Little Help needed to walk in hospital room?: A Little Help needed climbing 3-5 steps with a railing? : A Lot 6 Click Score: 19    End of Session Equipment Utilized During Treatment: Gait belt Activity Tolerance: Patient tolerated treatment well Patient left: in bed;with call bell/phone within reach Nurse Communication: Mobility status PT Visit Diagnosis: Unsteadiness on feet (R26.81);Muscle weakness (generalized) (M62.81)     Time: [id number] PT Time Calculation (min) (ACUTE ONLY): 18 min  Charges:  $Gait Training: 8-22 mins                     Leighton Roach, [place]  Pager [phone] Office [place] 08/04/2019, 4:03 PM

## 2019-08-04 NOTE — Progress Notes (Signed)
SLP Cancellation Note  Patient Details Name: Elaine Owens MRN: [id number] DOB: 01/31/1926   Cancelled treatment:        Attempted to see pt for ongoing dysphagia management.  Pt working with OT at time of SLP attempt.  Will re-attempt as schedule permits.   Celedonio Savage , MA, [place] Office: [phone]; Pager [phone]): [phone]  (by text page app) 08/04/2019, 10:20 AM

## 2019-08-05 LAB — BASIC METABOLIC PANEL
Anion gap: 7 (ref 5–15)
BUN: 14 mg/dL (ref 8–23)
CO2: 26 mmol/L (ref 22–32)
Calcium: 9.1 mg/dL (ref 8.9–10.3)
Chloride: 103 mmol/L (ref 98–111)
Creatinine, Ser: 0.78 mg/dL (ref 0.44–1.00)
GFR calc Af Amer: >60 mL/min (ref >60)
GFR calc non Af Amer: >60 mL/min (ref >60)
Glucose, Bld: 106 mg/dL — ABNORMAL HIGH (ref 70–99)
Potassium: 3.5 mmol/L (ref 3.5–5.1)
Sodium: 136 mmol/L (ref 135–145)

## 2019-08-05 NOTE — Progress Notes (Signed)
   Subjective: Pt seen at the bedside this morning. Endorsing a headache this morning, denies any visual changes or weakness. Able to tolerate breakfast this morning well. No acute concerns at this time  Objective:  Vital signs in last 24 hours: Vitals:   08/04/19 2115 08/05/19 0014 08/05/19 0015 08/05/19 0417  BP:  138/63  (!) 155/54  Pulse:  (!) 31 (!) 59   Resp:  17    Temp: 97.8 F (36.6 C)  97.9 F (36.6 C) 97.8 F (36.6 C)  TempSrc: Oral  Oral Oral  SpO2:  (!) 75%    Weight:      Height:       Physical Exam Vitals signs and nursing note reviewed.  Constitutional:      General: She is not in acute distress.    Appearance: She is not ill-appearing.  Cardiovascular:     Rate and Rhythm: Normal rate and regular rhythm.     Heart sounds: Normal heart sounds.  Pulmonary:     Effort: Pulmonary effort is normal.     Breath sounds: Normal breath sounds.  Neurological:     Mental Status: She is alert and oriented to person, place, and time.     Comments: Confusion resolved today  Psychiatric:        Mood and Affect: Mood normal.    Assessment/Plan:  Active Problems:   Myasthenia gravis ([place])   Dysphagia   Acute delirium   Ms. Crumpton is a 85 yearold Fwith the past medical history of myasthenia gravis,dementia,HTN, and breast cancer(s/p mastectomy & tamoxifen tx in 1980s) who presented withprogressivegeneralized weakness,dysphagia,and dysarthriafor approximately 1 month.   Myasthenia gravisexacerbation  Initial stroke work-upnegative. Takes pyridostigmine 60 mgPO4 times daily at home.  Most recent NIF -25 and FVC 2.3L this morning. - continue IVIG day 3/5 -continue home pyridostigmine 60mg  4 times daily -continue prednisone 20mg   - formal speech eval recommending regular diet with thin liquids - PT/OT recommending home health and shower chair - continue aspirin 81 mg daily   Acute delirium - resolved this morning Pt confused earlier in  her admission per her daughter and nursing - exacerbated by hospital stay and steroid medications - continue to monitor   Diet:regular Fluids:none DVT ppx: enoxaparin40mg  subQdaily CODE STATUS: DNR  Dispo:Anticipated discharge in 2 days pending IVIG course.  Ladona Horns, MD 08/05/2019, 6:22 AM Pager: [phone]

## 2019-08-05 NOTE — Progress Notes (Signed)
  Date: 08/05/2019  Patient name: [place] record number: [id number]  Date of birth: 01-28-1926        I have seen and evaluated this patient and I have discussed the plan of care with the house staff. Please see their note for complete details. I concur with their findings.   Velna Ochs, MD 08/05/2019, 11:41 AM

## 2019-08-05 NOTE — Progress Notes (Signed)
RT NOTES: NIF -25 and VC 1.5L with good patient effort

## 2019-08-05 NOTE — Care Management Important Message (Signed)
Important Message  Patient Details  Name: Indira Wesby MRN: [id number] Date of Birth: 06/20/1926   Medicare Important Message Given:  Yes     Brendolyn Stockley [address] 08/05/2019, 3:47 PM

## 2019-08-06 LAB — GLUCOSE, CAPILLARY: Glucose-Capillary: 133 mg/dL — ABNORMAL HIGH (ref 70–99)

## 2019-08-06 MED ORDER — IBUPROFEN 400 MG PO TABS: 400.0000 mg | ORAL_TABLET | Freq: Four times a day (QID) | ORAL | Status: DC | PRN

## 2019-08-06 NOTE — Progress Notes (Signed)
Pt NIF -26 VC 1.9 L w/good pt effort. Pt respiratory status is stable at this time. RT will continue to monitor.

## 2019-08-06 NOTE — Progress Notes (Signed)
   Subjective: Pt seen at the bedside this morning. States she has a headache and some nausea that began after breakfast this morning. Denies any visual changes, trouble eating/swallowing, abdominal pain, or vomiting.   Objective:  Vital signs in last 24 hours: Vitals:   08/05/19 1721 08/05/19 2012 08/05/19 2328 08/06/19 0335  BP: (!) 151/63 (!) 178/85 (!) 143/61   Pulse: 80 60 62   Resp: 14 17 15    Temp: 98 F (36.7 C) 98.8 F (37.1 C) 98.2 F (36.8 C) 98.6 F (37 C)  TempSrc: Oral Oral Oral Oral  SpO2: 98% 97% 94%   Weight:    63.5 kg  Height:       Physical Exam Vitals signs and nursing note reviewed.  Constitutional:      Comments: Frail woman laying comfortably in bed, very pleasant  Eyes:     Extraocular Movements: Extraocular movements intact.  Pulmonary:     Effort: Pulmonary effort is normal.  Neurological:     Mental Status: She is alert and oriented to person, place, and time. Mental status is at baseline.    Assessment/Plan:  Active Problems:   Myasthenia gravis ([place])   Dysphagia   Acute delirium  Ms. Olthoff is a 85 yearold Fwith the past medical history of myasthenia gravis,dementia,HTN, and breast cancer(s/p mastectomy & tamoxifen tx in 1980s) who presented withprogressivegeneralized weakness,dysphagia,and dysarthriafor approximately 1 month.   Myasthenia gravisexacerbation  Takes pyridostigmine 60 mgPO4 times daily at home.  Most recent NIF -26 and FVC 1.9L this morning. -continueIVIGday 4/5 -continue home pyridostigmine 60mg  4 times daily -continueprednisone 20mg   - formal speech evalrecommending regular diet with thin liquids - continue aspirin 81 mg daily   Acute delirium - resolved - exacerbated by hospital stay and steroid medications - continue to monitor   Diet:regular Fluids:none DVT ppx: enoxaparin40mg  subQdaily CODE STATUS: DNR   Dispo:Anticipated discharge tomorrow upon completion of IVIG  course. Home with Home Health and shower chair.   Ladona Horns, MD 08/06/2019, 6:34 AM Pager: [phone]

## 2019-08-06 NOTE — Progress Notes (Signed)
OT Cancellation Note  Patient Details Name: Elaine Owens MRN: [id number] DOB: 09-01-1926   Cancelled Treatment:     Attempted at approx 1115 and 1340, patient reports feeling kind of "blah" today politely declining therapy. Will re-attempt as time permits.   [place] OT office: [place] 08/06/2019, 2:20 PM

## 2019-08-06 NOTE — Progress Notes (Signed)
  Date: 08/06/2019  Patient name: [place] record number: [id number]  Date of birth: Dec 01, 1925        I have seen and evaluated this patient and I have discussed the plan of care with the house staff. Please see their note for complete details. I concur with their findings.  Velna Ochs, MD 08/06/2019, 1:21 PM

## 2019-08-06 NOTE — Progress Notes (Signed)
Pt NIF -28, VC 1.4 L w/good pt effort. RT will continue to monitor.

## 2019-08-06 NOTE — Progress Notes (Signed)
Pt performed NIF and VC with good effort. NIF -25 and VC 1.7L. RT will continue to monitor.

## 2019-08-07 MED ORDER — IBUPROFEN 400 MG PO TABS: 400.0000 mg | ORAL_TABLET | Freq: Four times a day (QID) | ORAL | Status: DC

## 2019-08-07 MED ORDER — IBUPROFEN 400 MG PO TABS
400.0000 mg | ORAL_TABLET | Freq: Four times a day (QID) | ORAL | Status: DC
  Administered 2019-08-07: 400 mg via ORAL
  Filled 2019-08-07: qty 1

## 2019-08-07 NOTE — Progress Notes (Signed)
Subjective: States she has had no significant improvement in her facial strength from the IVIG treatments.   Objective: Current vital signs: BP (!) 153/63 (BP Location: Left Arm)   Pulse 78   Temp 97.8 F (36.6 C) (Oral)   Resp 16   Ht 5\' 1"  (1.549 m)   Wt 63 kg   SpO2 98%   BMI 26.26 kg/m  Vital signs in last 24 hours: Temp:  [97.4 F (36.3 C)-98.2 F (36.8 C)] 97.8 F (36.6 C) (11/20 1743) Pulse Rate:  [57-78] 78 (11/20 1743) Resp:  [15-20] 16 (11/20 1743) BP: (117-156)/(44-63) 153/63 (11/20 1743) SpO2:  [95 %-98 %] 98 % (11/20 1743) Weight:  [63 kg] 63 kg (11/20 0320)  Intake/Output from previous day: 11/19 0701 - 11/20 0700 In: 240 [P.O.:240] Out: 400 [Urine:400] Intake/Output this shift: No intake/output data recorded. Nutritional status:  Diet Order            Diet - low sodium heart healthy        Diet regular Room service appropriate? Yes; Fluid consistency: Thin  Diet effective now             HEENT: [place]/AT Lungs: Respirations unlabored  Neurologic Exam: Ment: Intact to complex questions and commands. CN: EOMI without double vision. Continues to have a transverse smile. Speech quality normal (non-nasal). No lingual dysarthria.  Motor: 5/5 x 4 Cerebellar: No ataxia with FNF bilaterally   Lab Results: Results for orders placed or performed during the hospital encounter of 08/02/19 (from the past 48 hour(s))  Glucose, capillary     Status: Abnormal   Collection Time: 08/06/19 10:18 PM  Result Value Ref Range   Glucose-Capillary 133 (H) 70 - 99 mg/dL    Recent Results (from the past 240 hour(s))  SARS CORONAVIRUS 2 (TAT 6-24 HRS) Nasopharyngeal Nasopharyngeal Swab     Status: None   Collection Time: 08/02/19 12:00 PM   Specimen: Nasopharyngeal Swab  Result Value Ref Range Status   SARS Coronavirus 2 NEGATIVE NEGATIVE Final    Comment: (NOTE) SARS-CoV-2 target nucleic acids are NOT DETECTED. The SARS-CoV-2 RNA is generally detectable in upper and  lower respiratory specimens during the acute phase of infection. Negative results do not preclude SARS-CoV-2 infection, do not rule out co-infections with other pathogens, and should not be used as the sole basis for treatment or other patient management decisions. Negative results must be combined with clinical observations, patient history, and epidemiological information. The expected result is Negative. Fact Sheet for Patients: SugarRoll.be Fact Sheet for Healthcare Providers: https://www.woods-mathews.com/ This test is not yet approved or cleared by the [place] FDA and  has been authorized for detection and/or diagnosis of SARS-CoV-2 by FDA under an Emergency Use Authorization (EUA). This EUA will remain  in effect (meaning this test can be used) for the duration of the COVID-19 declaration under Section 56 4(b)(1) of the Act, 21 U.S.C. section 360bbb-3(b)(1), unless the authorization is terminated or revoked sooner. Performed at [facility], [place] [address], [place], [place] [address]     Lipid Panel No results for input(s): CHOL, TRIG, HDL, CHOLHDL, VLDL, LDLCALC in the last 72 hours.  Studies/Results: No results found.  Medications:  Scheduled: . aspirin EC  81 mg Oral Daily   Or  . aspirin  150 mg Rectal Daily  . enoxaparin (LOVENOX) injection  40 mg Subcutaneous Q24H  . ibuprofen  400 mg Oral Q6H  . pantoprazole  40 mg Oral Daily  . predniSONE  20 mg Oral Daily  . pyridostigmine  60 mg Oral Q6H  . sodium chloride flush  3 mL Intravenous Once    Assessment: 85 year old female with increasing dysphagia over the past week. Also with diplopia during upgaze and transverse smile. There is a significant possibility that the dysphagia is related to her known myasthenia.  1. Possible mild myasthenia gravis exacerbation. Has not responded to IVIG. However, NIFs have trended slightly up and VC on 11/19 was 1.4.  2  Also  with possible seizure-like episode at home during which she had an episode of incontinence while walking, stating"I just peed on myself." She was apparently aware of it happening at the time.She then made a choking sound and had trouble getting words out. More likely the difficulty speaking was due to choking as EEG this admission showed findings most c/w mild to moderate diffuse encephalopathy, but no seizures or epileptiform discharges.   Recommendations: 1. Continue ASA for stroke prophylaxis 2. Last dose of IVIG today 3. Continue [place]on 4. Will need close neurology follow up after discharge.    LOS: 5 days   @Electronically  signed: Dr. Kerney Elbe 08/07/2019  11:06 PM

## 2019-08-07 NOTE — TOC Transition Note (Signed)
Transition of Care [facility]) - CM/SW Discharge Note   Patient Details  Name: Elaine Owens MRN: [id number] Date of Birth: 1926-01-30  [facility] CM/SW Contact:  Benard Halsted, LCSW Phone Number: 08/07/2019, 6:11 PM   Clinical Narrative:    CSW alerted [place] of patient's discharge today. No other needs identified.    Final next level of care: [place] Barriers to Discharge: No Barriers Identified   Patient Goals and CMS Choice Patient states their goals for this hospitalization and ongoing recovery are:: Return home CMS Medicare.gov Compare Post Acute Care list provided to:: Patient Represenative (must comment)(Daughter) Choice offered to / list presented to : Patient  Discharge Placement                       Discharge Plan and Services In-house Referral: NA Discharge Planning Services: CM Consult Post Acute Care Choice: Home Health                    HH Arranged: PT, OT [facility] Agency: [place] ([facility]) Date HH Agency Contacted: 08/05/19 Time HH Agency Contacted: 1500 Representative spoke with at [place]: [place] ([place]) Interventions     Readmission Risk Interventions No flowsheet data found.

## 2019-08-07 NOTE — Progress Notes (Signed)
Internal Medicine Attending Note:  I have seen and evaluated this patient and I have discussed the plan of care with the house staff. Please see their note for complete details. I concur with their findings.  Velna Ochs, MD 08/07/2019, 1:27 PM

## 2019-08-07 NOTE — Progress Notes (Signed)
Occupational Therapy Treatment Patient Details Name: Serria Secoy MRN: [id number] DOB: 1926-07-03 Today's Date: 08/07/2019    History of present illness Ms. Medsker is a 85 y.o female with the past medical history of myasthenia gravis, dementia, HTN, and breast cancer (s/p mastectomy & tamoxifen tx in 1980s), and hearing loss who presented with dysphagia and dysarthria. Initially presented to [facility] and went home and had further difficulty speaking and swallowing. MRI neg but showed chronic R cerebellar infarct.    OT comments  Patient sitting at edge of bed with nursing present upon arrival. Patient requesting asprin for headache, RN aware and provided during session. Patient was min guard for ambulation using rolling walker to safely navigate in her room, supervision for sink side grooming/hygiene with verbal cues for sequencing. At end of session patient sitting in chair with tray table and breakfast.     Follow Up Recommendations  Home health OT;Supervision/Assistance - 24 hour    Equipment Recommendations  Tub/shower bench       Precautions / Restrictions Precautions Precautions: Fall Restrictions Weight Bearing Restrictions: No       Mobility Bed Mobility               General bed mobility comments: patient sitting at edge of bed upon arrival  Transfers Overall transfer level: Needs assistance Equipment used: Rolling walker (2 wheeled) Transfers: Sit to/from Stand Sit to Stand: Min guard         General transfer comment: min guard for safety and navigating obstacles in her room, patient able to sit <> stand without physical assistance    Balance Overall balance assessment: Needs assistance Sitting-balance support: Feet supported Sitting balance-Leahy Scale: Good     Standing balance support: Bilateral upper extremity supported;During functional activity Standing balance-Leahy Scale: Fair Standing balance comment: use of RW                            ADL either performed or assessed with clinical judgement   ADL Overall ADL's : Needs assistance/impaired Eating/Feeding: Independent;Sitting   Grooming: Wash/dry face;Oral care;Brushing hair;Supervision/safety;Standing Grooming Details (indicate cue type and reason): patient requires verbal cues to initiate next step of grooming                 Toilet Transfer: Min guard;Ambulation;RW;Regular Toilet                              Cognition Arousal/Alertness: Awake/alert Behavior During Therapy: WFL for tasks assessed/performed Overall Cognitive Status: History of cognitive impairments - at baseline Area of Impairment: Memory;Following commands;Awareness;Problem solving;Safety/judgement                     Memory: Decreased short-term memory Following Commands: Follows one step commands consistently;Follows multi-step commands inconsistently Safety/Judgement: Decreased awareness of safety Awareness: Emergent Problem Solving: Requires verbal cues;Difficulty sequencing General Comments: requires verbal cues to recall next step of task                    Pertinent Vitals/ Pain       Pain Assessment: 0-10 Pain Score: 6  Pain Location: head ache Pain Descriptors / Indicators: Aching Pain Intervention(s): RN gave pain meds during session           Frequency  Min 2X/week        Progress Toward Goals  OT Goals(current goals can now be found  in the care plan section)  Progress towards OT goals: Progressing toward goals  Acute Rehab OT Goals Patient Stated Goal: to go home OT Goal Formulation: With patient Time For Goal Achievement: 08/18/19 Potential to Achieve Goals: Good ADL Goals Pt Will Perform Grooming: with supervision;standing Pt Will Perform Lower Body Bathing: with supervision Pt Will Perform Lower Body Dressing: with supervision Pt Will Transfer to Toilet: with supervision Pt Will Perform Toileting - Clothing  Manipulation and hygiene: with supervision Pt Will Perform Tub/Shower Transfer: with supervision  Plan Discharge plan remains appropriate        AM-PAC OT "6 Clicks" Daily Activity     Outcome Measure   Help from another person eating meals?: None Help from another person taking care of personal grooming?: A Little Help from another person toileting, which includes using toliet, bedpan, or urinal?: A Little Help from another person bathing (including washing, rinsing, drying)?: A Little Help from another person to put on and taking off regular upper body clothing?: A Little Help from another person to put on and taking off regular lower body clothing?: A Little 6 Click Score: 19    End of Session Equipment Utilized During Treatment: Rolling walker  OT Visit Diagnosis: Muscle weakness (generalized) (M62.81);Unsteadiness on feet (R26.81)   Activity Tolerance Patient tolerated treatment well   Patient Left in chair;with call bell/phone within reach;with chair alarm set   Nurse Communication Mobility status        Time: [id number] OT Time Calculation (min): 24 min  Charges: OT General Charges $OT Visit: 1 Visit OT Treatments $Self Care/Home Management : 23-37 mins  [place] OT office: [place] 08/07/2019, 10:31 AM

## 2019-08-07 NOTE — Plan of Care (Signed)

## 2019-08-07 NOTE — Progress Notes (Signed)
Physical Therapy Treatment Patient Details Name: Elaine Owens MRN: [id number] DOB: Dec 22, 1925 Today's Date: 08/07/2019    History of Present Illness Ms. Verney is a 85 y.o female with the past medical history of myasthenia gravis, dementia, HTN, and breast cancer (s/p mastectomy & tamoxifen tx in 1980s), and hearing loss who presented with dysphagia and dysarthria. Initially presented to [facility] and went home and had further difficulty speaking and swallowing. MRI neg but showed chronic R cerebellar infarct.     PT Comments    Pt cooperative. She was incontinent in bed when I arrived. She voided in [facility] twcie during PT session. She walked in halls with RW - focusing on technque.  Pt educated on need to continue with walking program at home to help her get her strength back.  Pt planning for DC home with daughter later today.   Follow Up Recommendations  Home health PT;Supervision for mobility/OOB     Equipment Recommendations  None recommended by PT    Recommendations for Other Services       Precautions / Restrictions Precautions Precautions: Fall Precaution Comments: decreased safety awareness Restrictions Weight Bearing Restrictions: No    Mobility  Bed Mobility Overal bed mobility: Needs Assistance Bed Mobility: Supine to Sit     Supine to sit: Supervision        Transfers Overall transfer level: Needs assistance Equipment used: Rolling walker (2 wheeled) Transfers: Sit to/from Stand Sit to Stand: Supervision Stand pivot transfers: Supervision       General transfer comment: supervision for safety and navigating obstacles in her room, patient able to sit <> stand without physical assistance  Ambulation/Gait Ambulation/Gait assistance: Min guard Gait Distance (Feet): 250 Feet Assistive device: Rolling walker (2 wheeled) Gait Pattern/deviations: Step-through pattern;Decreased stride length     General Gait Details: pt didnt fatique today.  worked on  standing taller/looking up and taking longer strides when she walks.   Stairs             Wheelchair Mobility    Modified Rankin (Stroke Patients Only)       Balance Overall balance assessment: Needs assistance           Standing balance-Leahy Scale: Fair                              Cognition Arousal/Alertness: Awake/alert Behavior During Therapy: WFL for tasks assessed/performed Overall Cognitive Status: History of cognitive impairments - at baseline                                 General Comments: pt cooperative and pleasant - requires verbal cues to recall next step of task      Exercises      General Comments        Pertinent Vitals/Pain Pain Assessment: 0-10 Pain Score: 6  Pain Location: head ache Pain Intervention(s): Monitored during session;Patient requesting pain meds-RN notified    Home Living                      Prior Function            PT Goals (current goals can now be found in the care plan section) Progress towards PT goals: Progressing toward goals    Frequency    Min 3X/week      PT Plan Current plan remains appropriate  Co-evaluation              AM-PAC PT "6 Clicks" Mobility   Outcome Measure  Help needed turning from your back to your side while in a flat bed without using bedrails?: None Help needed moving from lying on your back to sitting on the side of a flat bed without using bedrails?: None Help needed moving to and from a bed to a chair (including a wheelchair)?: A Little Help needed standing up from a chair using your arms (e.g., wheelchair or bedside chair)?: A Little Help needed to walk in hospital room?: A Little Help needed climbing 3-5 steps with a railing? : A Lot 6 Click Score: 19    End of Session Equipment Utilized During Treatment: Gait belt Activity Tolerance: Patient tolerated treatment well Patient left: in chair;with chair alarm set;with call  bell/phone within reach;with nursing/sitter in room Nurse Communication: Mobility status PT Visit Diagnosis: Unsteadiness on feet (R26.81);Muscle weakness (generalized) (M62.81)     Time: 1335-1400 PT Time Calculation (min) (ACUTE ONLY): 25 min  Charges:  $Gait Training: 23-37 mins                     08/07/2019   Rande Lawman, PT    Loyal Buba 08/07/2019, 2:31 PM

## 2019-08-07 NOTE — Discharge Summary (Signed)
Name: Elaine Owens MRN: [id number] DOB: [birth year] 85 y.o. PCP: Jettie Booze, NP  Date of Admission: 08/02/2019  5:38 PM Date of Discharge: 08/07/2019 Attending Physician: Velna Ochs, MD  Discharge Diagnosis: 1. Myasthenia gravis exacerbation  Discharge Medications: Allergies as of 08/07/2019      Reactions   Ciprofloxacin Other (See Comments)   Can not take if patient has myastenia gravis   Doxycycline Hyclate Other (See Comments)   Patient preference   Morphine Nausea And Vomiting   Morphine And Related Nausea And Vomiting   Mad her extremely sick      Medication List    STOP taking these medications   methylPREDNISolone 4 MG Tbpk tablet Commonly known as: MEDROL DOSEPAK     TAKE these medications   Acetaminophen 8 Hour 650 MG CR tablet Generic drug: acetaminophen Take 1,300 mg by mouth 2 (two) times daily.   Calcium-Vitamin D 600-125 MG-UNIT Tabs Take 2 tablets by mouth daily.   fluticasone 50 MCG/ACT nasal spray Commonly known as: FLONASE Place 2 sprays into both nostrils daily.   pantoprazole 40 MG tablet Commonly known as: PROTONIX Take 1 tablet (40 mg total) by mouth daily.   pyridostigmine 60 MG tablet Commonly known as: MESTINON Take 60 mg by mouth 4 (four) times daily.   vitamin B-12 1000 MCG tablet Commonly known as: CYANOCOBALAMIN Take 1,000 mcg by mouth daily.   vitamin C 1000 MG tablet Take 1,000 mg by mouth daily.            Durable Medical Equipment  (From admission, onward)         Start     Ordered   08/07/19 1524  DME tub bench  Once     08/07/19 1533          Disposition and follow-up:   ElaineElaine Owens was discharged from [facility] in Good condition.  At the hospital follow up visit please address:  1.  Myasthenia gravisexacerbation  - ensure pt is taking pyridostigme 60mg  4 times daily - pt to follow-up with neurology; given the number of [facility] as daughter  expressing interesting in finding a practice closer to their home  Fragility - pt sent home with home health PT and OT - ensure pt is tolerating a diet well and dysphagia has improved or resolved - some acute delirium while inpatient suspected to be related to steroid use and hospital stay  2.  Labs / imaging needed at time of follow-up: NONE  3.  Pending labs/ test needing follow-up: NONE  Follow-up Appointments: Follow-up Information    [facility] Follow up.   Why: Have your primary doctor make a referral or call this office to establish with a neurology group closer to your home. Contact information: [place], [place] Hospital Course by problem list: 1. Elaine Owens is a 85 year old F with the past medical history of myasthenia gravis,dementia,HTN, and breast cancer(s/p mastectomy & tamoxifen tx in 1980s) who presented withprogressive generalized weakness,dysphagia, and dysarthriafor approximately 1 month. Pt initially worked up for stroke with CT head and MRI brain. Both showed no acute or subacute intracranial abnormality and a chronic right cerebellar infarct (right SCA territory). An EEG was performed due to a reported urinary incontinence episode prior to admission and showed mild/moderate diffuse encephalopathy and no epileptiform discharges. Pt was evaluated by neurology who felt that  with her diplopia with upward gaze and transverse smile that the dysphagia was related to her myasthenia. Pt was continued on IV solumedrol while pt was having difficulty taking PO. She was evaluated by speech therapy who assessed that the pt was able to tolerate all consistencies and so was cleared for a regular diet with thin liquids. Pt was then continued on prednisone 20mg . On hospital day 2, pt had a poor NIF of -20 and FVC of 700cc. It was recommended per neurology that the pt be started on IVIG 0.4g/kg daily for 5  days, in addition to prednisone 20mg  and her home pyridostigme 60mg  4 times daily. Pt's clinical status, NIFs, and FVC continued to improve. On hospital day 2 and 3, pt was having non-intrusive visual hallucinations and general confusion/disorientation. This was attributed to steroid therapy and hospital delirium. She was placed on delirium precautions and her mental status and awareness improved back to baseline per her daughter on hospital day 5. She was discharged on her home pyridostigme 60mg  4 times daily, and instructed to follow-up with her neurologist.    Discharge Vitals:   BP (!) 156/55 (BP Location: Left Arm)   Pulse 62   Temp 97.6 F (36.4 C) (Axillary)   Resp 15   Ht 5\' 1"  (1.549 m)   Wt 63 kg   SpO2 96%   BMI 26.26 kg/m   Pertinent Labs, Studies, and Procedures:  CBC Latest Ref Rng & Units 08/03/2019 08/02/2019 10/12/2018  WBC 4.0 - 10.5 K/uL 6.1 6.3 9.3  Hemoglobin 12.0 - 15.0 g/dL 14.2 13.5 9.7(L)  Hematocrit 36.0 - 46.0 % 42.1 41.1 32.4(L)  Platelets 150 - 400 K/uL 206 205 279   CMP Latest Ref Rng & Units 08/05/2019 08/03/2019 08/02/2019  Glucose 70 - 99 mg/dL 106(H) 126(H) 110(H)  BUN 8 - 23 mg/dL 14 15 14   Creatinine 0.44 - 1.00 mg/dL 0.78 0.70 0.73  Sodium 135 - 145 mmol/L 136 136 139  Potassium 3.5 - 5.1 mmol/L 3.5 4.2 4.2  Chloride 98 - 111 mmol/L 103 101 103  CO2 22 - 32 mmol/L 26 20(L) 22  Calcium 8.9 - 10.3 mg/dL 9.1 9.3 9.4  Total Protein 6.5 - 8.1 g/dL - 6.5 6.7  Total Bilirubin 0.3 - 1.2 mg/dL - 0.6 0.7  Alkaline Phos 38 - 126 U/L - 49 50  AST 15 - 41 U/L - 22 23  ALT 0 - 44 U/L - 23 21   Lab Results  Component Value Date   TSH 1.968 08/02/2019   Urinalysis    Component Value Date/Time   COLORURINE STRAW (A) 08/02/2019 2205   APPEARANCEUR CLEAR 08/02/2019 2205   LABSPEC 1.011 08/02/2019 2205   PHURINE 6.0 08/02/2019 2205   GLUCOSEU 150 (A) 08/02/2019 2205   HGBUR SMALL (A) 08/02/2019 2205   BILIRUBINUR NEGATIVE 08/02/2019 2205   KETONESUR  20 (A) 08/02/2019 2205   PROTEINUR NEGATIVE 08/02/2019 2205   UROBILINOGEN 0.2 03/20/2013 0227   NITRITE NEGATIVE 08/02/2019 2205   LEUKOCYTESUR NEGATIVE 08/02/2019 2205   CXR 08/02/2019 CLINICAL DATA:  Dysphagia.  EXAM: PORTABLE CHEST 1 VIEW  COMPARISON:  01/08/2015  FINDINGS: Interval borderline enlarged cardiac silhouette. Aortic arch calcifications. Clear lungs. Diffuse osteopenia. Thoracic spine degenerative changes and mild scoliosis. Mild bilateral glenohumeral degenerative changes.  IMPRESSION: 1. No acute abnormality. 2. Interval borderline cardiomegaly. 3. Aortic atherosclerosis.   CT HEAD 08/02/2019 CLINICAL DATA:  85 year old female with altered mental status. Difficulty swallowing and choking. Reports stroke 1 month ago.  EXAM: CT HEAD WITHOUT CONTRAST  TECHNIQUE: Contiguous axial images were obtained from the base of the skull through the vertex without intravenous contrast.  COMPARISON:  Head CT 10/11/2018.  FINDINGS: Brain: Stable cerebral volume. No midline shift, mass effect, or evidence of intracranial mass lesion. No ventriculomegaly. No acute intracranial hemorrhage identified.  Chronic right superior cerebellar infarct is stable since January. Mild for age scattered cerebral white matter hypodensity appears stable. No acute or of all vein cortically based infarct identified, no supratentorial cortical encephalomalacia.  Vascular: Calcified atherosclerosis at the skull base. No suspicious intracranial vascular hyperdensity.  Skull: Hyperostosis.  No acute osseous abnormality identified.  Sinuses/Orbits: Visualized paranasal sinuses and mastoids are stable and well pneumatized.  Other: No acute orbit or scalp soft tissue findings.  IMPRESSION: 1. No acute intracranial abnormality, no acute or subacute infarct identified by CT. 2. Chronic right cerebellum SCA territory infarct and mild for age cerebral white matter  changes.   CXR 08/02/2019 CLINICAL DATA:  Evaluate for aspiration pneumonia  EXAM: PORTABLE CHEST 1 VIEW  COMPARISON:  08/02/2019  FINDINGS: Heart and mediastinal contours are within normal limits. No focal opacities or effusions. No acute bony abnormality.  IMPRESSION: No active disease.   MRI BRAIN 08/03/2019 CLINICAL DATA:  85 year old female with altered mental status, difficulty swallowing, choking. Reportedly status post stroke 1 month ago.  EXAM: MRI HEAD WITHOUT CONTRAST  TECHNIQUE: Multiplanar, multiecho pulse sequences of the brain and surrounding structures were obtained without intravenous contrast.  COMPARISON:  Head CT earlier tonight.  FINDINGS: Brain: Chronic right superior cerebellar artery infarct. No restricted diffusion or evidence of acute infarction.  Widely scattered cerebral white matter T2 and FLAIR hyperintensity is moderate for age in a nonspecific configuration.  No cerebral cortical encephalomalacia. No chronic cerebral blood products identified. The deep gray nuclei and brainstem are within normal limits for age.  No midline shift, mass effect, evidence of mass lesion, ventriculomegaly, extra-axial collection or acute intracranial hemorrhage. Cervicomedullary junction and pituitary are within normal limits.  Vascular: Major intracranial vascular flow voids are preserved. There is probably asymmetric slow flow in the left sigmoid sinus and IJ bulb.  Skull and upper cervical spine: Degenerative ligamentous hypertrophy about the odontoid. Otherwise negative for age visible cervical spine. Normal bone marrow signal. Hyperostosis of the skull, normal variant.  Sinuses/Orbits: Postoperative changes to both globes, otherwise negative orbits. Paranasal sinuses and mastoids are stable and well pneumatized.  Other: Visible internal auditory structures appear normal. Scalp and face soft tissues appear negative.   IMPRESSION: 1. No acute or subacute intracranial abnormality identified. 2. Chronic right cerebellar infarct (right SCA territory). Moderate for age cerebral white matter signal changes, most commonly due to chronic small vessel disease.   Discharge Instructions: Discharge Instructions    Call MD for:  difficulty breathing, headache or visual disturbances   Complete by: As directed    Call MD for:  extreme fatigue   Complete by: As directed    Call MD for:  hives   Complete by: As directed    Call MD for:  persistant dizziness or light-headedness   Complete by: As directed    Call MD for:  persistant nausea and vomiting   Complete by: As directed    Call MD for:  redness, tenderness, or signs of infection (pain, swelling, redness, odor or green/yellow discharge around incision site)   Complete by: As directed    Call MD for:  severe uncontrolled pain   Complete by: As directed  Call MD for:  temperature >100.4   Complete by: As directed    Diet - low sodium heart healthy   Complete by: As directed    Discharge instructions   Complete by: As directed    Ms. Klutts,  You were seen in the hospitalization for a flare of your myasthenia gravis, which resulted in some trouble swallowing, vision changes, and weakness. Imaging of your brain did not indicate that you had a stroke. We treated your myasthenia with steroids and medications that calm the immune system. You were evaluated by our speech pathologist who did not assess any deficits and recommended you to have a regular diet with thin liquids. These therapies helped you improve greatly! Please continue to take your Mestinon at home and follow-up at a neurology clinic.  Attached to your paperwork is the number for [facility]. You can call their office or have your primary care provider refer you in order to establish with a neurologist closer to your home.  Thank you for letting us be a part of your care!   Increase  activity slowly   Complete by: As directed       Signed: Ladona Horns, MD 08/07/2019, 3:33 PM   Pager: [phone]

## 2019-08-07 NOTE — Progress Notes (Signed)
Ma Hillock to be D/C'd home with home health per MD order. Discussed with the patient and daughter and all questions fully answered. VVS, Skin clean, dry and intact without evidence of skin break down, no evidence of skin tears noted.  IV catheter discontinued intact. Site without signs and symptoms of complications. Dressing and pressure applied. IVIG completed. An After Visit Summary was printed and given to the patient.  Patient escorted via [place], and D/C home via private auto.  Melonie [place]  08/07/2019 6:17 PM

## 2019-08-07 NOTE — Progress Notes (Signed)
   Subjective: Pt seen at the bedside this morning. States she is still having a headache, though denies any other symptoms, no vision changes. Pt is anticipating discharge this evening with her daughter. No acute concerns at this time.  Objective:  Vital signs in last 24 hours: Vitals:   08/06/19 1919 08/07/19 0038 08/07/19 0320 08/07/19 0454  BP: (!) 109/53 (!) 129/56 (!) 117/44 (!) 134/57  Pulse: 63 63 (!) 57   Resp: 19 20 20    Temp: 97.6 F (36.4 C) (!) 97.4 F (36.3 C) 97.9 F (36.6 C)   TempSrc: Axillary Oral Axillary   SpO2: 92% 97% 95%   Weight:   63 kg   Height:       Physical Exam Vitals signs and nursing note reviewed.  Constitutional:      General: She is not in acute distress.    Comments: Frail.   Eyes:     Comments: EOMI and without diplopia   Cardiovascular:     Rate and Rhythm: Normal rate and regular rhythm.  Pulmonary:     Effort: Pulmonary effort is normal.  Skin:    General: Skin is warm and dry.  Neurological:     Mental Status: She is alert.    Assessment/Plan:  Active Problems:   Myasthenia gravis ([place])   Dysphagia   Acute delirium  Elaine Owens is a 85 yearold Fwith the past medical history of myasthenia gravis,dementia,HTN, and breast cancer(s/p mastectomy & tamoxifen tx in 1980s) who presented withprogressivegeneralized weakness,dysphagia,and dysarthriafor approximately 1 month.   Myasthenia gravisexacerbation  Takes pyridostigmine 60 mgPO4 times daily at home.  Most recent NIF -28and FVC 1.4 this morning. -continueIVIGday5/5 -continue home pyridostigmine 60mg  4 times daily -prednisone 20mg  today and discontinue at discharge - formal speech evalrecommending regular diet with thin liquids - continue aspirin 81 mg daily  Pt has ibuprofen 400mg  scheduled q6h for her headache. Suspect it is a side effect of her IVIG therapy.   Acute delirium - resolved -exacerbatedby hospital stay and steroid medications  - continue to monitor   Diet:regular Fluids:none DVT ppx: enoxaparin40mg  subQdaily CODE STATUS: DNR   Dispo:Anticipated discharge this evening after completion ofIVIG course. Home with Home Health and shower chair.    Ladona Horns, MD 08/07/2019, 6:23 AM Pager: [phone]

## 2019-09-15 ENCOUNTER — Emergency Department (HOSPITAL_COMMUNITY): Payer: Medicare HMO

## 2019-09-15 ENCOUNTER — Emergency Department (HOSPITAL_COMMUNITY)
Admission: EM | Admit: 2019-09-15 | Discharge: 2019-09-15 | Disposition: A | Discharge status: Home / Self Care | Payer: Medicare HMO | Attending: Emergency Medicine | Admitting: Emergency Medicine

## 2019-09-15 ENCOUNTER — Other Ambulatory Visit: Payer: Self-pay

## 2019-09-15 DIAGNOSIS — R1310 Dysphagia, unspecified: Secondary | ICD-10-CM

## 2019-09-15 DIAGNOSIS — I10 Essential (primary) hypertension: Secondary | ICD-10-CM | POA: Insufficient documentation

## 2019-09-15 DIAGNOSIS — Z20828 Contact with and (suspected) exposure to other viral communicable diseases: Secondary | ICD-10-CM | POA: Diagnosis not present

## 2019-09-15 DIAGNOSIS — M79651 Pain in right thigh: Secondary | ICD-10-CM | POA: Insufficient documentation

## 2019-09-15 DIAGNOSIS — G7000 Myasthenia gravis without (acute) exacerbation: Secondary | ICD-10-CM | POA: Insufficient documentation

## 2019-09-15 DIAGNOSIS — Z79899 Other long term (current) drug therapy: Secondary | ICD-10-CM | POA: Diagnosis not present

## 2019-09-15 DIAGNOSIS — Z853 Personal history of malignant neoplasm of breast: Secondary | ICD-10-CM | POA: Diagnosis not present

## 2019-09-15 DIAGNOSIS — R10813 Right lower quadrant abdominal tenderness: Secondary | ICD-10-CM | POA: Diagnosis not present

## 2019-09-15 DIAGNOSIS — W19XXXA Unspecified fall, initial encounter: Secondary | ICD-10-CM

## 2019-09-15 DIAGNOSIS — M25551 Pain in right hip: Secondary | ICD-10-CM | POA: Diagnosis present

## 2019-09-15 LAB — COMPREHENSIVE METABOLIC PANEL
ALT: 28 U/L (ref 0–44)
AST: 29 U/L (ref 15–41)
Albumin: 4 g/dL (ref 3.5–5.0)
Alkaline Phosphatase: 53 U/L (ref 38–126)
Anion gap: 10 (ref 5–15)
BUN: 10 mg/dL (ref 8–23)
CO2: 24 mmol/L (ref 22–32)
Calcium: 9.7 mg/dL (ref 8.9–10.3)
Chloride: 105 mmol/L (ref 98–111)
Creatinine, Ser: 0.68 mg/dL (ref 0.44–1.00)
GFR calc Af Amer: >60 mL/min (ref >60)
GFR calc non Af Amer: >60 mL/min (ref >60)
Glucose, Bld: 94 mg/dL (ref 70–99)
Potassium: 4.1 mmol/L (ref 3.5–5.1)
Sodium: 139 mmol/L (ref 135–145)
Total Bilirubin: 0.4 mg/dL (ref 0.3–1.2)
Total Protein: 7.3 g/dL (ref 6.5–8.1)

## 2019-09-15 LAB — CBC WITH DIFFERENTIAL/PLATELET
Abs Immature Granulocytes: 0.07 10*3/uL (ref 0.00–0.07)
Basophils Absolute: 0 10*3/uL (ref 0.0–0.1)
Basophils Relative: 0 %
Eosinophils Absolute: 0.2 10*3/uL (ref 0.0–0.5)
Eosinophils Relative: 3 %
HCT: 41.9 % (ref 36.0–46.0)
Hemoglobin: 13.7 g/dL (ref 12.0–15.0)
Immature Granulocytes: 1 %
Lymphocytes Relative: 24 %
Lymphs Abs: 1.5 10*3/uL (ref 0.7–4.0)
MCH: 31.9 pg (ref 26.0–34.0)
MCHC: 32.7 g/dL (ref 30.0–36.0)
MCV: 97.4 fL (ref 80.0–100.0)
Monocytes Absolute: 0.6 10*3/uL (ref 0.1–1.0)
Monocytes Relative: 9 %
Neutro Abs: 3.8 10*3/uL (ref 1.7–7.7)
Neutrophils Relative %: 63 %
Platelets: 192 10*3/uL (ref 150–400)
RBC: 4.3 MIL/uL (ref 3.87–5.11)
RDW: 12.5 % (ref 11.5–15.5)
WBC: 6 10*3/uL (ref 4.0–10.5)
nRBC: 0 % (ref 0.0–0.2)

## 2019-09-15 LAB — URINALYSIS, ROUTINE W REFLEX MICROSCOPIC
Bilirubin Urine: NEGATIVE
Glucose, UA: NEGATIVE mg/dL
Hgb urine dipstick: NEGATIVE
Ketones, ur: 5 mg/dL — AB
Leukocytes,Ua: NEGATIVE
Nitrite: NEGATIVE
Protein, ur: NEGATIVE mg/dL
Specific Gravity, Urine: 1.005 (ref 1.005–1.030)
pH: 7 (ref 5.0–8.0)

## 2019-09-15 LAB — SARS CORONAVIRUS 2 (TAT 6-24 HRS): SARS Coronavirus 2: NEGATIVE

## 2019-09-15 MED ORDER — IOHEXOL 300 MG/ML  SOLN
100.0000 mL | Freq: Once | INTRAMUSCULAR | Status: AC | PRN
  Administered 2019-09-15: 100 mL via INTRAVENOUS

## 2019-09-15 NOTE — ED Triage Notes (Signed)
Pt here from home where she hit her right hip /upper leg on the bed a couple of days ago , pt is able to move the leg but does hurt to walk on it ,

## 2019-09-15 NOTE — ED Notes (Signed)
Report received from Ashley, RN 

## 2019-09-15 NOTE — ED Provider Notes (Addendum)
[facility] Provider Note   CSN: [id number] Arrival date & time: 09/15/19  1247     History Chief Complaint  Patient presents with  . Hip Pain    Elaine Owens is a 85 y.o. female.  Patient with history of myasthenia gravis and hypertension here with right hip pain and proximal thigh pain for the past 2 days.  States she ran into the bedpost in her bedroom several days ago and injured her right anterior and lateral hip.  She states she fell onto the ground but did not hit her head or lose consciousness.  Complains of pain to her right lateral hip that is worse with pain and movement.  She is able to bear weight but it is painful.  Denies hitting her head or losing consciousness.  Does not take any blood thinners.  Denies any neck or back pain.  No chest pain or abdominal pain.  No focal weakness, numbness or tingling.  She been taking medication at home for but is not sure what it is.  She was able to come to the hospital today by private vehicle and able to walk.  Discussed with patient's daughter [name] by phone.  She states patient had a fall about 1 week ago and then bumped her hip again on 12/25.  She is had increased pain in that hip since.  She is also concerned that patient could be having a myasthenia gravis exacerbation.  States she has had difficulty holding her head up and difficulty swallowing for the past 2 or 3 days similar to when she presented 1 month ago.  States she has had trouble getting up instructions standing from a sitting position.  She states the patient has been choking and having trouble swallowing food and having difficulty  The history is provided by the patient.  Hip Pain Pertinent negatives include no chest pain, no abdominal pain, no headaches and no shortness of breath.       Past Medical History:  Diagnosis Date  . Arthritis   . Complication of anesthesia    hard to wake up  . Hypertension   . Immune  deficiency disorder ([place])   . lt breast ca dx'd 1978 or 1988 pt unsure   lt masectomy; tamoxifen completed  . Myasthenia gravis [facility])     Patient Active Problem List   Diagnosis Date Noted  . Acute delirium   . Dysphagia   . Symptomatic anemia 10/11/2018  . GIB (gastrointestinal bleeding) 10/11/2018  . Anemia due to acute blood loss   . Upper airway cough syndrome vs cough variant asthma 03/02/2015  . Essential hypertension 07/15/2013  . Generalized weakness 03/20/2013  . Acute renal failure ([place]) 03/20/2013  . Myasthenia gravis ([place]) 03/20/2013  . UTI (urinary tract infection) 03/20/2013    Past Surgical History:  Procedure Laterality Date  . ABDOMINAL HYSTERECTOMY    . APPENDECTOMY    . BREAST SURGERY    . ESOPHAGOGASTRODUODENOSCOPY (EGD) WITH PROPOFOL N/A 10/12/2018   Procedure: ESOPHAGOGASTRODUODENOSCOPY (EGD) WITH PROPOFOL;  Surgeon: Otis Brace, MD;  Location: [facility];  Service: Gastroenterology;  Laterality: N/A;  . EYE SURGERY     lens implant  . HEMORRHOID SURGERY    . masectomny Left    1978  . MASTECTOMY       OB History   No obstetric history on file.     Family History  Problem Relation Age of Onset  . Colon cancer Father   .  Asthma Father   . Liver cancer Mother     Social History   Tobacco Use  . Smoking status: Never Smoker  . Smokeless tobacco: Never Used  Substance Use Topics  . Alcohol use: No  . Drug use: Not on file    Home Medications Prior to Admission medications   Medication Sig Start Date End Date Taking? Authorizing Provider  acetaminophen (ACETAMINOPHEN 8 HOUR) 650 MG CR tablet Take 1,300 mg by mouth 2 (two) times daily.    [provider]  Ascorbic Acid (VITAMIN C) 1000 MG tablet Take 1,000 mg by mouth daily.    [provider]  Calcium Carbonate-Vitamin D (CALCIUM-VITAMIN D) 600-125 MG-UNIT TABS Take 2 tablets by mouth daily.    [provider]  fluticasone (FLONASE) 50 MCG/ACT nasal  spray Place 2 sprays into both nostrils daily. Patient not taking: Reported on 10/11/2018 04/22/15   Chesley Mires, MD  pantoprazole (PROTONIX) 40 MG tablet Take 1 tablet (40 mg total) by mouth daily. Patient not taking: Reported on 08/02/2019 10/13/18   Kayleen Memos, DO  pyridostigmine (MESTINON) 60 MG tablet Take 60 mg by mouth 4 (four) times daily.     [provider]  vitamin B-12 (CYANOCOBALAMIN) 1000 MCG tablet Take 1,000 mcg by mouth daily.    [provider]    Allergies    Ciprofloxacin, Doxycycline hyclate, Morphine, and Morphine and related  Review of Systems   Review of Systems  Constitutional: Negative for activity change, appetite change and fever.  HENT: Negative for congestion and rhinorrhea.   Respiratory: Negative for cough, chest tightness and shortness of breath.   Cardiovascular: Negative for chest pain.  Gastrointestinal: Negative for abdominal pain, nausea and vomiting.  Genitourinary: Negative for dysuria and hematuria.  Musculoskeletal: Positive for arthralgias and myalgias. Negative for back pain.  Neurological: Positive for weakness. Negative for dizziness, light-headedness and headaches.   all other systems are negative except as noted in the HPI and PMH.    Physical Exam Updated Vital Signs BP (!) 147/65 (BP Location: Left Arm)   Pulse 90   Temp 98.1 F (36.7 C) (Oral)   Resp 16   SpO2 100%   Physical Exam Vitals and nursing note reviewed.  Constitutional:      General: She is not in acute distress.    Appearance: She is well-developed.  HENT:     Head: Normocephalic and atraumatic.     Mouth/Throat:     Pharynx: No oropharyngeal exudate.     Comments: Mild R nasolabial fold flattening, but smile is symmetric Eyes:     Conjunctiva/sclera: Conjunctivae normal.     Pupils: Pupils are equal, round, and reactive to light.  Neck:     Comments: No meningismus. Cardiovascular:     Rate and Rhythm: Normal rate and regular rhythm.      Heart sounds: Normal heart sounds. No murmur.  Pulmonary:     Effort: Pulmonary effort is normal. No respiratory distress.     Breath sounds: Normal breath sounds.  Abdominal:     Palpations: Abdomen is soft.     Tenderness: There is abdominal tenderness. There is no guarding or rebound.     Comments: Mild R sided lower abdominal tenderness, no guarding or rebound  Musculoskeletal:        General: Tenderness present. Normal range of motion.     Cervical back: Normal range of motion and neck supple.     Comments: Right lateral hip tenderness without crepitance  or ecchymosis.  No shortening or external rotation.  Able to passively flex and extend hip and knee on the right side.  Intact DP pulses bilaterally  No T or L-spine pain  Skin:    General: Skin is warm.     Capillary Refill: Capillary refill takes less than 2 seconds.  Neurological:     General: No focal deficit present.     Mental Status: She is alert and oriented to person, place, and time. Mental status is at baseline.     Cranial Nerves: No cranial nerve deficit.     Motor: No abnormal muscle tone.     Coordination: Coordination normal.     Comments:  5/5 strength throughout. CN 2-12 intact.Equal grip strength.   Psychiatric:        Behavior: Behavior normal.     ED Results / Procedures / Treatments   Labs (all labs ordered are listed, but only abnormal results are displayed) Labs Reviewed  URINALYSIS, ROUTINE W REFLEX MICROSCOPIC - Abnormal; Notable for the following components:      Result Value   Color, Urine STRAW (*)    Ketones, ur 5 (*)    All other components within normal limits  SARS CORONAVIRUS 2 (TAT 6-24 HRS)  CBC WITH DIFFERENTIAL/PLATELET  COMPREHENSIVE METABOLIC PANEL    EKG None  Radiology DG Chest 1 View  Result Date: 09/15/2019 CLINICAL DATA:  85 year old female with dysphagia. EXAM: CHEST  1 VIEW COMPARISON:  Chest radiograph dated 08/02/2019. FINDINGS: No focal consolidation, pleural  effusion, or pneumothorax. There is mild cardiomegaly. Atherosclerotic calcification of the aorta. No acute osseous pathology. IMPRESSION: 1. No acute cardiopulmonary process. 2. Mild cardiomegaly. Electronically Signed   By: Anner Crete M.D.   On: 09/15/2019 16:09   MR BRAIN WO CONTRAST  Result Date: 09/15/2019 CLINICAL DATA:  Weakness and difficulty swallowing over the last few days. EXAM: MRI HEAD WITHOUT CONTRAST TECHNIQUE: Multiplanar, multiecho pulse sequences of the brain and surrounding structures were obtained without intravenous contrast. COMPARISON:  08/03/2019 FINDINGS: Brain: Diffusion imaging does not show any acute or subacute infarction. The brainstem is normal. There is an old cerebellar infarction on the right. Cerebral hemispheres show moderate chronic small-vessel ischemic changes throughout the deep and subcortical white matter. No large vessel territory infarction. No mass lesion, hemorrhage, hydrocephalus or extra-axial collection. Vascular: Major vessels at the base of the brain show flow. Skull and upper cervical spine: Negative Sinuses/Orbits: Clear/normal Other: None IMPRESSION: No acute or reversible finding. Age related atrophy. Moderate chronic small-vessel ischemic changes of the white matter, fairly typical for age. Electronically Signed   By: Nelson Chimes M.D.   On: 09/15/2019 19:38   CT ABDOMEN PELVIS W CONTRAST  Result Date: 09/15/2019 CLINICAL DATA:  Generalized abdominal pain for several days. EXAM: CT ABDOMEN AND PELVIS WITH CONTRAST TECHNIQUE: Multidetector CT imaging of the abdomen and pelvis was performed using the standard protocol following bolus administration of intravenous contrast. CONTRAST:  138mL OMNIPAQUE IOHEXOL 300 MG/ML  SOLN COMPARISON:  Abdomen MRI on 12/08/2015 FINDINGS: Lower Chest: No acute findings. Hepatobiliary: No hepatic masses identified. Gallbladder is unremarkable in appearance. No evidence of biliary obstruction. A benign-appearing  intermediate attenuation cystic structure is again seen in the porta hepatis between the gallbladder and duodenum. This measures 5.1 x 4.4 cm, without significant change in size or appearance compared to previous study in 2017. Pancreas: No evidence of solid pancreatic mass are significant pancreatic ductal dilatation. Several tiny less than 1 cm cystic lesions are  seen in the pancreatic body and tail which are stable and may represent tiny indolent cystic neoplasms or pseudocysts. Spleen: Within normal limits in size and appearance. Adrenals/Urinary Tract: No masses identified. No evidence of hydronephrosis. Stomach/Bowel: A moderate to large hiatal hernia is seen which is new since previous study. No evidence of obstruction, inflammatory process or abnormal fluid collections. Vascular/Lymphatic: No pathologically enlarged lymph nodes. No abdominal aortic aneurysm. Aortic atherosclerosis incidentally noted. Reproductive: Prior hysterectomy noted. Adnexal regions are unremarkable in appearance. Other:  None. Musculoskeletal:  No suspicious bone lesions identified. IMPRESSION: 1. New moderate to large hiatal hernia. 2. Stable benign-appearing cystic structure in porta hepatis between gallbladder and duodenum. 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 Aortic Atherosclerosis (ICD10-I70.0). Electronically Signed   By: Marlaine Hind M.D.   On: 09/15/2019 20:41   DG Hip Unilat W or Wo Pelvis 2-3 Views Right  Result Date: 09/15/2019 CLINICAL DATA:  Injured hip 2 days ago. EXAM: DG HIP (WITH OR WITHOUT PELVIS) 2-3V RIGHT COMPARISON:  None. FINDINGS: Both hips are normally located. No fracture or AVN. Moderate bilateral hip joint degenerative changes with joint space narrowing and spurring. The pubic symphysis and SI joints are intact. No pelvic fractures or bone lesions. Lower lumbar facet disease and disc disease is noted. IMPRESSION: Moderate bilateral hip joint degenerative  changes but no fracture or AVN. Intact bony pelvis. Electronically Signed   By: Marijo Sanes M.D.   On: 09/15/2019 14:13   DG Femur Min 2 Views Right  Result Date: 09/15/2019 CLINICAL DATA:  Blunt trauma 2 days ago with hip pain, initial encounter EXAM: RIGHT FEMUR 2 VIEWS COMPARISON:  None. FINDINGS: Visualized pelvic ring is intact. No acute fracture or dislocation is noted. Mild degenerative changes of the hip joint are seen. Mild patellofemoral degenerative changes seen. No acute fracture or dislocation is noted. IMPRESSION: No acute bony abnormality is noted. Mild degenerative changes of the hip joint and knee joint on the right are seen. Electronically Signed   By: Inez Catalina M.D.   On: 09/15/2019 14:25    Procedures Procedures (including critical care time)  Medications Ordered in ED Medications - No data to display  ED Course  I have reviewed the triage vital signs and the nursing notes.  Pertinent labs & imaging results that were available during my care of the patient were reviewed by me and considered in my medical decision making (see chart for details).    MDM Rules/Calculators/A&P                      Patient with a history of myasthenia gravis here with right hip pain after a fall several days ago.  Denies any head injury.  She is neurovascularly intact without obvious deformity.  Triage x-rays are negative.  Patient daughter was concerned that patient was having an MG exacerbation but patient denied any difficulty swallowing or focal weakness.  She was seen by neurology Dr. Rory Percy who was unsuccessful at contacting patient daughter.  He did not believe that she was having an MG exacerbation however. Did not think that she would benefit from any IVIG or steroids. Did recommend MRI of her brain to rule out infarct which was negative.  Patient able to ambulate without difficulty.  CT shows no pelvic or hip fracture.  Does show hiatal hernia as well as stable cyst near  her liver  No further inpatient work-up per neurology.  Does need referral to see neurology  as outpatient for establishing care for her myasthenia gravis. She did pass swallow screen.  Tolerating PO and ambulatory.   Referral to neurology given per plan by Dr. Rory Percy.  Attempts to contact daughter were not successful.  Nursing staff to try further   Final Clinical Impression(s) / ED Diagnoses Final diagnoses:  Fall, initial encounter  Right hip pain    Rx / DC Orders ED Discharge Orders    None       Maralee Higuchi, Annie Main, MD 09/15/19 [id number]    Ezequiel Essex, MD 09/15/19 2311

## 2019-09-15 NOTE — ED Notes (Signed)
Nurse Navigator called and spoke with daughter and updated her that we are currently waiting on MRI

## 2019-09-15 NOTE — ED Notes (Signed)
Patient transported to MRI 

## 2019-09-15 NOTE — ED Notes (Signed)
Patient transported to X-ray 

## 2019-09-15 NOTE — ED Notes (Signed)
Patient transported to CT 

## 2019-09-15 NOTE — Consult Note (Addendum)
Neurology Consultation  Reason for Consult: Possible myasthenia gravis exacerbation Referring Physician: Rancour  History is obtained from: Chart and patient however patient is unable to give a detailed history.  Attempted to call daughter 3 times with all calls going to voicemail  Attending addendum: Attempted to call the numbers on the chart multiple times myself with no success.  HPI: Elaine Owens is a 85 y.o. female with history of myasthenia gravis, breast cancer, hypertension, complications of anesthesia and arthritis.  Patient was initially brought to the emergency department secondary to hip pain and proximal thigh pain for the last few days.  While patient was in the emergency room daughter was called for further information.  At that time the daughter had apparently stated to the emergency physician that she has noticed that her mother's seems to be having difficulty holding her head up along with difficulty swallowing for the past 2 or 3 days, similar to how she presented 1 month ago.  She also states that she has been having trouble getting up to a standing position.  There is mention of choking when swallowing her food.  Currently when talking to the patient she states that she has been having difficulty swallowing food for "some good time" but could not give me an exact time line.  She denies having any weakness in her head or difficulties holding her head up.  She denies any weakness.  She is unable to tell me who is prescribing her Mestinon and cannot tell me if she has a neurologist.   ED course CT head ordered   Chart review (patient was recently seen on 08/02/2019 for dysphagia.  In that note it states this is apparently been going on for a week.  At that time there was concern for possible stroke versus myasthenic crisis.  The following day on progress note she had no complaints of dysphagia symptoms.  MRI brain was normal with chronic right cerebellar infarct.  At that time  IVIG was started.  EEG was ordered.  EEG showed mild to moderate diffuse encephalopathy with no seizure or epileptiform discharges.  Patient had no significant improvement with facial strength from IVIG treatments.  It was stated that patient needs close follow-up with neurology during discharge.  From going through her chart I do not see where she had any follow-up.    Past Medical History:  Diagnosis Date  . Arthritis   . Complication of anesthesia    hard to wake up  . Hypertension   . Immune deficiency disorder ([place])   . lt breast ca dx'd 1978 or 1988 pt unsure   lt masectomy; tamoxifen completed  . Myasthenia gravis ([place])      Family History  Problem Relation Age of Onset  . Colon cancer Father   . Asthma Father   . Liver cancer Mother    Social History:   reports that she has never smoked. She has never used smokeless tobacco. She reports that she does not drink alcohol. No history on file for drug.  Medications No current facility-administered medications for this encounter.  Current Outpatient Medications:  .  acetaminophen (ACETAMINOPHEN 8 HOUR) 650 MG CR tablet, Take 1,300 mg by mouth 2 (two) times daily., Disp: , Rfl:  .  MELATONIN PO, Take 1 tablet by mouth at bedtime as needed (sleep)., Disp: , Rfl:  .  pyridostigmine (MESTINON) 60 MG tablet, Take 60 mg by mouth 4 (four) times daily. , Disp: , Rfl:  .  vitamin  B-12 (CYANOCOBALAMIN) 1000 MCG tablet, Take 1,000 mcg by mouth daily., Disp: , Rfl:   ROS:    General ROS: negative for - chills, fatigue, fever, night sweats, weight gain or weight loss Psychological ROS: negative for - behavioral disorder, hallucinations, memory difficulties, mood swings or suicidal ideation Ophthalmic ROS: negative for - blurry vision, double vision, eye pain or loss of vision ENT ROS: negative for - epistaxis, nasal discharge, oral lesions, sore throat, tinnitus or vertigoe Respiratory ROS: negative for - cough, hemoptysis, shortness of  breath or wheezing Cardiovascular ROS: negative for - chest pain, dyspnea on exertion, edema or irregular heartbeat Gastrointestinal ROS: negative for - abdominal pain, diarrhea, hematemesis, nausea/vomiting or stool incontinence Genito-Urinary ROS: negative for - dysuria, hematuria, incontinence or urinary frequency/urgency Musculoskeletal ROS: negative for -hip pain Neurological ROS: as noted in HPI Dermatological ROS: negative for rash and skin lesion changes  Exam: Current vital signs: BP (!) 147/65 (BP Location: Left Arm)   Pulse 90   Temp 98.1 F (36.7 C) (Oral)   Resp 16   SpO2 100%  Vital signs in last 24 hours: Temp:  [98.1 F (36.7 C)] 98.1 F (36.7 C) (12/29 1256) Pulse Rate:  [90] 90 (12/29 1256) Resp:  [16] 16 (12/29 1256) BP: (147)/(65) 147/65 (12/29 1256) SpO2:  [100 %] 100 % (12/29 1256)   Constitutional: Appears well-developed and well-nourished.  Psych: Affect appropriate to situation Eyes: No scleral injection HENT: No OP obstrucion Head: Normocephalic.  Cardiovascular: Normal rate and regular rhythm.  Respiratory: Effort normal, non-labored breathing GI: Soft.  No distension. There is no tenderness.  Skin: WDI  Neuro: Mental Status: Patient is alert she knows she is at [facility], she believes is [facility], she does know the president is Trump, she knows that she is at the hospital secondary to hip pain.  She is able to tell me how many quarters are in a dollar and then how many quarters are in $2. Cranial Nerves: II: Visual Fields are full.  III,IV, VI: EOMI without ptosis or diploplia. Pupils equal, round and reactive to light V: Facial sensation is symmetric to temperature VII: Right nasolabial fold decrease with symmetric smile VIII: hearing is intact to voice X: Palat elevates symmetrically XI: Shoulder shrug is symmetric. XII: tongue is midline without atrophy or fasciculations.  Motor: 4+/5 strength throughout Sensory: Sensation is  symmetric to light touch and temperature in the arms and legs. Deep Tendon Reflexes: 2+ and symmetric in the biceps and patellae.  Plantars: Toes are downgoing bilaterally.  Cerebellar: FNF within normal limits  Labs I have reviewed labs in epic and the results pertinent to this consultation are:   CBC    Component Value Date/Time   WBC 6.0 09/15/2019 1530   RBC 4.30 09/15/2019 1530   HGB 13.7 09/15/2019 1530   HCT 41.9 09/15/2019 1530   PLT 192 09/15/2019 1530   MCV 97.4 09/15/2019 1530   MCH 31.9 09/15/2019 1530   MCHC 32.7 09/15/2019 1530   RDW 12.5 09/15/2019 1530   LYMPHSABS 1.5 09/15/2019 1530   MONOABS 0.6 09/15/2019 1530   EOSABS 0.2 09/15/2019 1530   BASOSABS 0.0 09/15/2019 1530    CMP     Component Value Date/Time   NA 139 09/15/2019 1530   K 4.1 09/15/2019 1530   CL 105 09/15/2019 1530   CO2 24 09/15/2019 1530   GLUCOSE 94 09/15/2019 1530   BUN 10 09/15/2019 1530   CREATININE 0.68 09/15/2019 1530   CALCIUM 9.7  09/15/2019 1530   PROT 7.3 09/15/2019 1530   ALBUMIN 4.0 09/15/2019 1530   AST 29 09/15/2019 1530   ALT 28 09/15/2019 1530   ALKPHOS 53 09/15/2019 1530   BILITOT 0.4 09/15/2019 1530   GFRNONAA >60 09/15/2019 1530   GFRAA >60 09/15/2019 1530    Lipid Panel  No results found for: CHOL, TRIG, HDL, CHOLHDL, VLDL, LDLCALC, LDLDIRECT   Imaging I have reviewed the images obtained:  CT-scan of the brain-ordered   Etta Quill PA-C [facility] [phone]  M-F  (9:00 am- 5:00 PM)  09/15/2019, 4:20 PM     Assessment:  This is a 85 year old female presenting to the ED initially for fall.  As noted above she has recently been seen for swallowing difficulties.  At that time EEG, MRI were normal.  Patient was given IVIG with no improvement.  Due to daughter's concern of possible head weakness and dysphagia which is similar to previous episode she is worried this may be a myasthenic exacerbation.  On exam I do not see any neck  weakness, jaw weakness, along upper gaze to diplopia or extremity weakness.  At this time I do not believe that she is having a myasthenic exacerbation.  I would like to know who she gets the Mestinon from however I attempted to call the daughter 3 times which went straight to voicemail.  Patient is unable to give me this information.   Impression: -Fall -Myasthenia gravis with no clear signs of exacerbation -Reported dysphagia  Recommendations: -Swallow screen -Continue home dose Mestinon if passes swallow screen -Again it is imperative that patient will need follow-up with neurology. -Obtain an MRI of the brain to rule out a brainstem pathology with the given symptoms and inability to get further history from the family.  Plan discussed with Dr. Wyvonnia Dusky in detail.  Attending Neurohospitalist Addendum Patient seen and examined with APP/Resident. Agree with the history and physical as documented above. Agree with the plan as documented, which I helped formulate. I have independently reviewed the chart, obtained history, review of systems and examined the patient.I have personally reviewed pertinent head/neck/spine imaging (CT/MRI).   I personally attempted to call the family numbers listed on the file, with no response. Agree with the history physical exam documented above. Due to the dysphagia symptoms, age and risk factors, I would recommend doing an MRI to rule out a brainstem stroke.  If the swallow screen is okay and MRI does not show any strokes, no further neurological work-up is recommended at this time. Discussed with Dr. Wyvonnia Dusky  Please feel free to call with any questions. --- Amie Portland, MD [facility] Pager: [phone]  If 7pm to 7am, please call on call as listed on AMION.   ADDENDUM -Prelim MRI negative for acute stroke. Plan relayed to Dr. Wyvonnia Dusky via phone. -F/U with Dr. Posey Pronto at [facility] neurology -- Amie Portland, MD [facility] Pager: [phone] If 7pm to 7am, please call on call as listed on AMION.

## 2019-09-15 NOTE — ED Notes (Signed)
EDP at bedside  

## 2019-09-15 NOTE — Discharge Instructions (Signed)
Your x-rays and CT scans do not show any broken bones.  The MRI does not show any stroke.  The neurologist does not think you are having a myasthenia gravis exacerbation.  He recommends follow-up with neurologist in the office.  Return to the ED with new or worsening symptoms.

## 2019-09-15 NOTE — ED Notes (Signed)
Patient verbalizes understanding of discharge instructions. Opportunity for questioning and answers were provided. Armband removed by staff, pt discharged from ED to home via [place] with daughter

## 2019-09-15 NOTE — ED Notes (Signed)
Neuro at bedside.

## 2019-09-15 NOTE — ED Notes (Signed)
Neuro PA at bedside.  

## 2019-09-15 NOTE — ED Notes (Addendum)
Pt ambulated to the bathroom with hand held assist. No complaints at this time.

## 2019-10-23 ENCOUNTER — Inpatient Hospital Stay (HOSPITAL_COMMUNITY)
Admission: EM | Admit: 2019-10-23 | Discharge: 2019-10-30 | DRG: 056 | Disposition: A | Discharge status: Hospice | Payer: Medicare HMO | Attending: Neurology | Admitting: Neurology

## 2019-10-23 ENCOUNTER — Encounter (HOSPITAL_COMMUNITY): Payer: Self-pay | Admitting: Emergency Medicine

## 2019-10-23 ENCOUNTER — Inpatient Hospital Stay (HOSPITAL_COMMUNITY): Payer: Medicare HMO

## 2019-10-23 ENCOUNTER — Other Ambulatory Visit: Payer: Self-pay

## 2019-10-23 DIAGNOSIS — Z885 Allergy status to narcotic agent status: Secondary | ICD-10-CM | POA: Diagnosis not present

## 2019-10-23 DIAGNOSIS — Z79899 Other long term (current) drug therapy: Secondary | ICD-10-CM

## 2019-10-23 DIAGNOSIS — Z515 Encounter for palliative care: Secondary | ICD-10-CM

## 2019-10-23 DIAGNOSIS — Z20822 Contact with and (suspected) exposure to covid-19: Secondary | ICD-10-CM | POA: Diagnosis present

## 2019-10-23 DIAGNOSIS — J9600 Acute respiratory failure, unspecified whether with hypoxia or hypercapnia: Secondary | ICD-10-CM | POA: Diagnosis not present

## 2019-10-23 DIAGNOSIS — R0603 Acute respiratory distress: Secondary | ICD-10-CM | POA: Diagnosis not present

## 2019-10-23 DIAGNOSIS — Z800 Family history of malignant neoplasm of digestive organs: Secondary | ICD-10-CM | POA: Diagnosis not present

## 2019-10-23 DIAGNOSIS — M1990 Unspecified osteoarthritis, unspecified site: Secondary | ICD-10-CM | POA: Diagnosis present

## 2019-10-23 DIAGNOSIS — Z7189 Other specified counseling: Secondary | ICD-10-CM

## 2019-10-23 DIAGNOSIS — R000 Tachycardia, unspecified: Secondary | ICD-10-CM | POA: Diagnosis not present

## 2019-10-23 DIAGNOSIS — I160 Hypertensive urgency: Secondary | ICD-10-CM | POA: Diagnosis present

## 2019-10-23 DIAGNOSIS — I4891 Unspecified atrial fibrillation: Secondary | ICD-10-CM | POA: Diagnosis not present

## 2019-10-23 DIAGNOSIS — I10 Essential (primary) hypertension: Secondary | ICD-10-CM | POA: Diagnosis present

## 2019-10-23 DIAGNOSIS — Z90710 Acquired absence of both cervix and uterus: Secondary | ICD-10-CM | POA: Diagnosis not present

## 2019-10-23 DIAGNOSIS — Z79890 Hormone replacement therapy (postmenopausal): Secondary | ICD-10-CM | POA: Diagnosis not present

## 2019-10-23 DIAGNOSIS — N390 Urinary tract infection, site not specified: Secondary | ICD-10-CM | POA: Diagnosis present

## 2019-10-23 DIAGNOSIS — R1310 Dysphagia, unspecified: Secondary | ICD-10-CM | POA: Diagnosis present

## 2019-10-23 DIAGNOSIS — I471 Supraventricular tachycardia: Secondary | ICD-10-CM | POA: Diagnosis not present

## 2019-10-23 DIAGNOSIS — H938X9 Other specified disorders of ear, unspecified ear: Secondary | ICD-10-CM

## 2019-10-23 DIAGNOSIS — Z853 Personal history of malignant neoplasm of breast: Secondary | ICD-10-CM | POA: Diagnosis not present

## 2019-10-23 DIAGNOSIS — R471 Dysarthria and anarthria: Secondary | ICD-10-CM | POA: Diagnosis present

## 2019-10-23 DIAGNOSIS — Z881 Allergy status to other antibiotic agents status: Secondary | ICD-10-CM | POA: Diagnosis not present

## 2019-10-23 DIAGNOSIS — J9601 Acute respiratory failure with hypoxia: Secondary | ICD-10-CM | POA: Diagnosis present

## 2019-10-23 DIAGNOSIS — Z825 Family history of asthma and other chronic lower respiratory diseases: Secondary | ICD-10-CM

## 2019-10-23 DIAGNOSIS — Z5320 Procedure and treatment not carried out because of patient's decision for unspecified reasons: Secondary | ICD-10-CM | POA: Diagnosis present

## 2019-10-23 DIAGNOSIS — Z9012 Acquired absence of left breast and nipple: Secondary | ICD-10-CM

## 2019-10-23 DIAGNOSIS — I480 Paroxysmal atrial fibrillation: Secondary | ICD-10-CM | POA: Diagnosis not present

## 2019-10-23 DIAGNOSIS — G7000 Myasthenia gravis without (acute) exacerbation: Secondary | ICD-10-CM

## 2019-10-23 DIAGNOSIS — F0390 Unspecified dementia without behavioral disturbance: Secondary | ICD-10-CM | POA: Diagnosis present

## 2019-10-23 DIAGNOSIS — D849 Immunodeficiency, unspecified: Secondary | ICD-10-CM | POA: Diagnosis present

## 2019-10-23 DIAGNOSIS — Z66 Do not resuscitate: Secondary | ICD-10-CM | POA: Diagnosis present

## 2019-10-23 DIAGNOSIS — G7001 Myasthenia gravis with (acute) exacerbation: Secondary | ICD-10-CM | POA: Diagnosis not present

## 2019-10-23 LAB — DIFFERENTIAL
Abs Immature Granulocytes: 0.02 10*3/uL (ref 0.00–0.07)
Basophils Absolute: 0 10*3/uL (ref 0.0–0.1)
Basophils Relative: 1 %
Eosinophils Absolute: 0.1 10*3/uL (ref 0.0–0.5)
Eosinophils Relative: 2 %
Immature Granulocytes: 0 %
Lymphocytes Relative: 35 %
Lymphs Abs: 1.9 10*3/uL (ref 0.7–4.0)
Monocytes Absolute: 0.4 10*3/uL (ref 0.1–1.0)
Monocytes Relative: 8 %
Neutro Abs: 2.9 10*3/uL (ref 1.7–7.7)
Neutrophils Relative %: 54 %

## 2019-10-23 LAB — I-STAT CHEM 8, ED
BUN: 12 mg/dL (ref 8–23)
Calcium, Ion: 1.17 mmol/L (ref 1.15–1.40)
Chloride: 106 mmol/L (ref 98–111)
Creatinine, Ser: 0.7 mg/dL (ref 0.44–1.00)
Glucose, Bld: 98 mg/dL (ref 70–99)
HCT: 43 % (ref 36.0–46.0)
Hemoglobin: 14.6 g/dL (ref 12.0–15.0)
Potassium: 3.5 mmol/L (ref 3.5–5.1)
Sodium: 139 mmol/L (ref 135–145)
TCO2: 26 mmol/L (ref 22–32)

## 2019-10-23 LAB — URINALYSIS, ROUTINE W REFLEX MICROSCOPIC
Bilirubin Urine: NEGATIVE
Glucose, UA: 50 mg/dL — AB
Hgb urine dipstick: NEGATIVE
Ketones, ur: 20 mg/dL — AB
Nitrite: NEGATIVE
Protein, ur: NEGATIVE mg/dL
Specific Gravity, Urine: 1.015 (ref 1.005–1.030)
pH: 6 (ref 5.0–8.0)

## 2019-10-23 LAB — TSH: TSH: 1.226 u[IU]/mL (ref 0.350–4.500)

## 2019-10-23 LAB — APTT
aPTT: 24 seconds (ref 24–36)
aPTT: 25 seconds (ref 24–36)

## 2019-10-23 LAB — COMPREHENSIVE METABOLIC PANEL
ALT: 33 U/L (ref 0–44)
AST: 35 U/L (ref 15–41)
Albumin: 4.3 g/dL (ref 3.5–5.0)
Alkaline Phosphatase: 46 U/L (ref 38–126)
Anion gap: 13 (ref 5–15)
BUN: 11 mg/dL (ref 8–23)
CO2: 24 mmol/L (ref 22–32)
Calcium: 10 mg/dL (ref 8.9–10.3)
Chloride: 103 mmol/L (ref 98–111)
Creatinine, Ser: 0.73 mg/dL (ref 0.44–1.00)
GFR calc Af Amer: >60 mL/min (ref >60)
GFR calc non Af Amer: >60 mL/min (ref >60)
Glucose, Bld: 106 mg/dL — ABNORMAL HIGH (ref 70–99)
Potassium: 3.7 mmol/L (ref 3.5–5.1)
Sodium: 140 mmol/L (ref 135–145)
Total Bilirubin: 0.8 mg/dL (ref 0.3–1.2)
Total Protein: 7.3 g/dL (ref 6.5–8.1)

## 2019-10-23 LAB — PROTIME-INR
INR: 1.1 (ref 0.8–1.2)
Prothrombin Time: 13.6 seconds (ref 11.4–15.2)

## 2019-10-23 LAB — CBC
HCT: 43.9 % (ref 36.0–46.0)
HCT: 43.6 % (ref 36.0–46.0)
Hemoglobin: 14.3 g/dL (ref 12.0–15.0)
Hemoglobin: 14.1 g/dL (ref 12.0–15.0)
MCH: 31.5 pg (ref 26.0–34.0)
MCH: 31.3 pg (ref 26.0–34.0)
MCHC: 32.6 g/dL (ref 30.0–36.0)
MCHC: 32.3 g/dL (ref 30.0–36.0)
MCV: 96.7 fL (ref 80.0–100.0)
MCV: 96.7 fL (ref 80.0–100.0)
Platelets: 224 10*3/uL (ref 150–400)
Platelets: 198 10*3/uL (ref 150–400)
RBC: 4.54 MIL/uL (ref 3.87–5.11)
RBC: 4.51 MIL/uL (ref 3.87–5.11)
RDW: 12.9 % (ref 11.5–15.5)
RDW: 12.8 % (ref 11.5–15.5)
WBC: 5.4 10*3/uL (ref 4.0–10.5)
WBC: 4.9 10*3/uL (ref 4.0–10.5)
nRBC: 0.4 % — ABNORMAL HIGH (ref 0.0–0.2)
nRBC: 0 % (ref 0.0–0.2)

## 2019-10-23 LAB — RESPIRATORY PANEL BY RT PCR (FLU A&B, COVID)
Influenza A by PCR: NEGATIVE
Influenza B by PCR: NEGATIVE
SARS Coronavirus 2 by RT PCR: NEGATIVE

## 2019-10-23 MED ORDER — SODIUM CHLORIDE 0.9% FLUSH
3.0000 mL | Freq: Once | INTRAVENOUS | Status: AC
  Administered 2019-10-23: 23:00:00 3 mL via INTRAVENOUS

## 2019-10-23 MED ORDER — SODIUM CHLORIDE 0.9 % IV BOLUS
500.0000 mL | Freq: Once | INTRAVENOUS | Status: AC
  Administered 2019-10-23: 22:00:00 500 mL via INTRAVENOUS

## 2019-10-23 MED ORDER — PYRIDOSTIGMINE BROMIDE 10 MG/2ML IV SOLN
2.0000 mg | Freq: Four times a day (QID) | INTRAVENOUS | Status: DC
Start: 2019-10-24 — End: 2019-10-29
  Administered 2019-10-23 – 2019-10-29 (×22): 2 mg via INTRAVENOUS
  Filled 2019-10-23 (×24): qty 0.4

## 2019-10-23 MED ORDER — ENOXAPARIN SODIUM 40 MG/0.4ML ~~LOC~~ SOLN
40.0000 mg | SUBCUTANEOUS | Status: DC
  Administered 2019-10-23 – 2019-10-28 (×6): 40 mg via SUBCUTANEOUS
  Filled 2019-10-23 (×6): qty 0.4

## 2019-10-23 MED ORDER — IMMUNE GLOBULIN (HUMAN) 10 GM/100ML IV SOLN
400.0000 mg/kg | Freq: Every day | INTRAVENOUS | Status: AC
  Administered 2019-10-24 – 2019-10-27 (×5): 20 g via INTRAVENOUS
  Filled 2019-10-23 (×5): qty 200

## 2019-10-23 MED ORDER — PYRIDOSTIGMINE BROMIDE 60 MG/5ML PO SOLN: 60.0000 mg | Freq: Four times a day (QID) | ORAL | Status: DC

## 2019-10-23 MED ORDER — ACETAMINOPHEN 325 MG PO TABS: 650.0000 mg | ORAL_TABLET | ORAL | Status: DC | PRN

## 2019-10-23 MED ORDER — HYDRALAZINE HCL 20 MG/ML IJ SOLN
2.0000 mg | INTRAMUSCULAR | Status: DC | PRN
  Administered 2019-10-25 – 2019-10-28 (×5): 2 mg via INTRAVENOUS
  Filled 2019-10-23 (×5): qty 1

## 2019-10-23 MED ORDER — ACETAMINOPHEN 650 MG RE SUPP
650.0000 mg | RECTAL | Status: DC | PRN
  Administered 2019-10-27: 14:00:00 650 mg via RECTAL
  Filled 2019-10-23: qty 1

## 2019-10-23 NOTE — ED Notes (Signed)
Patient placed on pure wick °

## 2019-10-23 NOTE — Progress Notes (Signed)
Attempted BIPAP per MD order PT became extremely agitated, and ripped mask off several times. I discussed the risk of not wearing it with her and why her condition may cause further problems. She is continuing to refuse at this time. MD was made aware, may have to try again at a later time.

## 2019-10-23 NOTE — ED Provider Notes (Signed)
[facility] EMERGENCY DEPARTMENT Provider Note   CSN: [id number] Arrival date & time: 10/23/19  1837     History Chief Complaint  Patient presents with  . Weakness    Elaine Owens is a 86 y.o. female. Level 5 caveat due to dementia.  Weakness Patient presented for generalized weakness decreased oral intake difficulty swallowing.  History of myasthenia gravis.  Patient really cannot tell me how long things have been going on for.  Patient's daughter stated that have been progressive.  Patient states it feels as if she swallows and get stuck in her throat.  Denies fever and cough.     Past Medical History:  Diagnosis Date  . Arthritis   . Complication of anesthesia    hard to wake up  . Hypertension   . Immune deficiency disorder ([place])   . lt breast ca dx'd 1978 or 1988 pt unsure   lt masectomy; tamoxifen completed  . Myasthenia gravis [facility])     Patient Active Problem List   Diagnosis Date Noted  . Myasthenic crisis ([place]) 10/23/2019  . Acute delirium   . Dysphagia   . Symptomatic anemia 10/11/2018  . GIB (gastrointestinal bleeding) 10/11/2018  . Anemia due to acute blood loss   . Upper airway cough syndrome vs cough variant asthma 03/02/2015  . Essential hypertension 07/15/2013  . Generalized weakness 03/20/2013  . Acute renal failure ([place]) 03/20/2013  . Myasthenia gravis ([place]) 03/20/2013  . UTI (urinary tract infection) 03/20/2013    Past Surgical History:  Procedure Laterality Date  . ABDOMINAL HYSTERECTOMY    . APPENDECTOMY    . BREAST SURGERY    . ESOPHAGOGASTRODUODENOSCOPY (EGD) WITH PROPOFOL N/A 10/12/2018   Procedure: ESOPHAGOGASTRODUODENOSCOPY (EGD) WITH PROPOFOL;  Surgeon: Otis Brace, MD;  Location: [facility] ENDOSCOPY;  Service: Gastroenterology;  Laterality: N/A;  . EYE SURGERY     lens implant  . HEMORRHOID SURGERY    . masectomny Left    1978  . MASTECTOMY       OB History   No obstetric history on file.     Family  History  Problem Relation Age of Onset  . Colon cancer Father   . Asthma Father   . Liver cancer Mother     Social History   Tobacco Use  . Smoking status: Never Smoker  . Smokeless tobacco: Never Used  Substance Use Topics  . Alcohol use: No  . Drug use: Not on file    Home Medications Prior to Admission medications   Medication Sig Start Date End Date Taking? Authorizing Provider  acetaminophen (ACETAMINOPHEN 8 HOUR) 650 MG CR tablet Take 1,300 mg by mouth 2 (two) times daily.    [provider]  MELATONIN PO Take 1 tablet by mouth at bedtime as needed (sleep).    [provider]  pyridostigmine (MESTINON) 60 MG tablet Take 60 mg by mouth 4 (four) times daily.     [provider]  vitamin B-12 (CYANOCOBALAMIN) 1000 MCG tablet Take 1,000 mcg by mouth daily.    [provider]    Allergies    Ciprofloxacin, Doxycycline hyclate, Morphine, and Morphine and related  Review of Systems   Review of Systems  Unable to perform ROS: Dementia  Neurological: Positive for weakness.    Physical Exam Updated Vital Signs BP (!) 168/141 (BP Location: Right Arm)   Pulse (!) 105   Temp 97.7 F (36.5 C) (Oral)   Resp 20   SpO2 99%  Physical Exam Vitals reviewed.  HENT:     Head: Atraumatic.  Eyes:     Extraocular Movements: Extraocular movements intact.     Pupils: Pupils are equal, round, and reactive to light.     Comments: May have mild lid droop on left.  Cardiovascular:     Rate and Rhythm: Regular rhythm.  Pulmonary:     Breath sounds: No wheezing, rhonchi or rales.  Abdominal:     Tenderness: There is no abdominal tenderness.  Skin:    General: Skin is warm.     Capillary Refill: Capillary refill takes less than 2 seconds.  Neurological:     Mental Status: She is alert.     Comments: Awake and pleasant but some confusion.  Moves all extremities.  Good grip strength bilaterally but may have mildly decreased extension on the left  upper extremity.Kermit Balo straight leg raise bilaterally.  May have mild left lid droop.  Eye movements intact.     ED Results / Procedures / Treatments   Labs (all labs ordered are listed, but only abnormal results are displayed) Labs Reviewed  CBC - Abnormal; Notable for the following components:      Result Value   nRBC 0.4 (*)    All other components within normal limits  COMPREHENSIVE METABOLIC PANEL - Abnormal; Notable for the following components:   Glucose, Bld 106 (*)    All other components within normal limits  URINALYSIS, ROUTINE W REFLEX MICROSCOPIC - Abnormal; Notable for the following components:   APPearance HAZY (*)    Glucose, UA 50 (*)    Ketones, ur 20 (*)    Leukocytes,Ua MODERATE (*)    Bacteria, UA RARE (*)    All other components within normal limits  RESPIRATORY PANEL BY RT PCR (FLU A&B, COVID)  PROTIME-INR  APTT  DIFFERENTIAL  CBC  APTT  URINALYSIS, ROUTINE W REFLEX MICROSCOPIC  TSH  BASIC METABOLIC PANEL  I-STAT CHEM 8, ED  CBG MONITORING, ED  POC SARS CORONAVIRUS 2 AG -  ED    EKG EKG Interpretation  Date/Time:  Friday October 23 2019 18:42:06 EST Ventricular Rate:  112 PR Interval:  154 QRS Duration: 70 QT Interval:  318 QTC Calculation: 434 R Axis:   -69 Text Interpretation: Sinus tachycardia with occasional Premature ventricular complexes Left axis deviation Septal infarct , age undetermined Abnormal ECG Confirmed by Davonna Belling [phone]) on 10/23/2019 7:15:08 PM   Radiology DG Chest Portable 1 View  Result Date: 10/23/2019 CLINICAL DATA:  Shortness of breath EXAM: PORTABLE CHEST 1 VIEW COMPARISON:  September 15, 2019 FINDINGS: The heart size and mediastinal contours are within normal limits. Aortic knob calcifications are seen. Mildly increased interstitial markings seen at both lung bases as on prior exam, likely chronic lung changes. No large airspace consolidation or pleural effusion. No acute osseous abnormality. IMPRESSION: No  active disease. Electronically Signed   By: Prudencio Pair M.D.   On: 10/23/2019 21:36    Procedures Procedures (including critical care time)  Medications Ordered in ED Medications  sodium chloride flush (NS) 0.9 % injection 3 mL (has no administration in time range)  sodium chloride 0.9 % bolus 500 mL (has no administration in time range)  Immune Globulin 10% (PRIVIGEN) IV infusion 400 mg/kg (has no administration in time range)  acetaminophen (TYLENOL) tablet 650 mg (has no administration in time range)    Or  acetaminophen (TYLENOL) suppository 650 mg (has no administration in time range)  enoxaparin (LOVENOX) injection  40 mg (has no administration in time range)  hydrALAZINE (APRESOLINE) injection 2 mg (has no administration in time range)  pyridostigmine (MESTINON) injection 2 mg (has no administration in time range)    ED Course  I have reviewed the triage vital signs and the nursing notes.  Pertinent labs & imaging results that were available during my care of the patient were reviewed by me and considered in my medical decision making (see chart for details).    MDM Rules/Calculators/A&P                     Respiratory therapy did a NIF and an FVC.  Some question of accuracy due to patient effort but came in with a NIF of less than 10 and an FVC of 700.  Patient with weakness.  History myasthenia gravis.  Has been gradually worsening.  Seen by neurology.  Verified CODE STATUS of DO NOT INTUBATE.  However has had a somewhat decrease in her respiratory status and now right I will not cross the midline medially.  Discussed with ICU will be started on BiPAP.  Respiratory has requested a point-of-care Covid test since the other is not returned yet.  Will be admitted to ICU  CRITICAL CARE Performed by: Davonna Belling Total critical care time: 30 minutes Critical care time was exclusive of separately billable procedures and treating other patients. Critical care was necessary  to treat or prevent imminent or life-threatening deterioration. Critical care was time spent personally by me on the following activities: development of treatment plan with patient and/or surrogate as well as nursing, discussions with consultants, evaluation of patient's response to treatment, examination of patient, obtaining history from patient or surrogate, ordering and performing treatments and interventions, ordering and review of laboratory studies, ordering and review of radiographic studies, pulse oximetry and re-evaluation of patient's condition.  Final Clinical Impression(s) / ED Diagnoses Final diagnoses:  Myasthenia gravis [facility])    Rx / [place] Orders ED Discharge Orders    None       Davonna Belling, MD 10/23/19 2202

## 2019-10-23 NOTE — ED Notes (Signed)
SDU Tava Mohs daughter [phone] looking for an update

## 2019-10-23 NOTE — H&P (Addendum)
Neurology H&P  CC: Trouble swallowing  History is obtained from: Patient, chart  HPI: Elaine Owens is a 86 y.o. female with a history of myasthenia gravis, but I am not certain where was diagnosed, who is managed chronically on Mestinon who has been having increasing difficulty swallowing over the past few days.  I am uncertain if she is taking her medicine or not.  I tried calling her daughter several times without answer.  I am not sure where she is currently getting her Mestinon, she states that she does not have a neurologist.  She has been listed as DNR multiple times in the past few hospitalizations, and therefore I discussed myasthenic exacerbations and possible need for mechanical ventilation with the patient.  She states that she would not want to be kept alive with machines in any case.   ROS: A 14 point ROS was performed and is negative except as noted in the HPI.   Past Medical History:  Diagnosis Date  . Arthritis   . Complication of anesthesia    hard to wake up  . Hypertension   . Immune deficiency disorder ([place])   . lt breast ca dx'd 1978 or 1988 pt unsure   lt masectomy; tamoxifen completed  . Myasthenia gravis ([place])      Family History  Problem Relation Age of Onset  . Colon cancer Father   . Asthma Father   . Liver cancer Mother      Social History:  reports that she has never smoked. She has never used smokeless tobacco. She reports that she does not drink alcohol. No history on file for drug.   Exam: Current vital signs: BP (!) 175/85 (BP Location: Right Arm)   Pulse 97   Temp 97.7 F (36.5 C) (Oral)   Resp 16   SpO2 100%  Vital signs in last 24 hours: Temp:  [97.7 F (36.5 C)-97.9 F (36.6 C)] 97.7 F (36.5 C) (02/05 1927) Pulse Rate:  [97-114] 97 (02/05 1927) Resp:  [16] 16 (02/05 1927) BP: (175-191)/(85-89) 175/85 (02/05 1927) SpO2:  [100 %] 100 % (02/05 1927)  Physical Exam  Constitutional: Appears well-developed and well-nourished.   Psych: Affect appropriate to situation Eyes: No scleral injection HENT: No OP obstrucion Head: Normocephalic.  Cardiovascular: Normal rate and regular rhythm.  Respiratory: Effort normal and breath sounds normal to anterior ascultation GI: Soft.  No distension. There is no tenderness.  Skin: WDI  Neuro: Mental Status: Patient is awake, alert, oriented to person, place, month, year, and situation. No signs of aphasia or neglect Cranial Nerves: II: Visual Fields are full. Pupils are equal, round, and reactive to light.   III,IV, VI: she has some limitation in upgaze, but relatively conjugate gaze until I perform sustained upgaze at which point she does have disconjugate sheet of her eyes with her right eye slightly deviating outwardly.  She reports diplopia when that happens.  She does not have any clear ptosis. She does have limitation of adduction of the right eye.  V: Facial sensation is symmetric to temperature VII: Facial movement is symmetric, but she does have a transverse smile. X: She has poor elevation of her palate bilaterally. Motor: She has 5 out of 5 strength to confrontation throughout.  She has very poor neck flexion with 3/5 strength. Sensory: Sensation is symmetric to light touch and temperature in the arms and legs. Deep Tendon Reflexes: 2+ and symmetric in the biceps and patellae.  Cerebellar: FNF  intact bilaterally  I have reviewed labs in epic and the results pertinent to this consultation are: CMP-unremarkable  I have reviewed the images obtained: MRI brain from December, typical atrophy for age.  Primary Diagnosis:  Myasthenic crisis  Secondary diagnosis: Acute respiratory failure Dysphagia Essential hypertension  Impression: 86 yo F with MG crises as evidenced by poor NIFs and FVC, diplopia on sustained upgaze, poor palatal movement and dysphagia.  At this point, I would favor starting IVIG.  I would also favor continuing Mestinon, will use syrup  instead of tablets for now.  Her numbers are at the stage where elective intubation would be considered, however she has indicated that she would not want intubation even in a lifesaving situation.  I do think that consideration of noninvasive ventilation may be reasonable, and therefore I have asked pulmonary for their input regarding noninvasive ventilation.  She is also quite hypertensive, and I would favor avoiding tablets for the time being so I will use hydralazine.  Beta-blockers can cause transient worsening of myasthenia which would not be desirable in her case.    Recommendations: 1) start IVIG 400 mg/kg x 5 days for myasthenic exacerbation 2) hydralazine 2.5 mg IV every 6 hours as needed for hypertensive urgency 3) pulmonary consult for consideration of noninvasive ventilation and respiratory failure 4) Mestinon syrup 60 mg every 6 hours 5) ST evaluation 6) UA, TSH to look for contributing factors.   Roland Rack, MD [facility] [phone]  If 7pm- 7am, please page neurology on call as listed in [place]. 10/23/2019  9:36 PM

## 2019-10-23 NOTE — Progress Notes (Signed)
I was able to get a hold of the daughter who confirmed her DNR/DNI status, full treatment up to ventilation/resusscitation. She also confirms that the patient has not missed any doses of her medication.   Roland Rack, MD [facility] [phone]  If 7pm- 7am, please page neurology on call as listed in [place].

## 2019-10-23 NOTE — Consult Note (Signed)
NAME:  Elaine Owens, MRN:  [id number], DOB:  19-Feb-1926, LOS: 0 ADMISSION DATE:  10/23/2019, CONSULTATION DATE: 10/23/2019 REFERRING MD: Leonel Ramsay, CHIEF COMPLAINT: Slurred speech  Brief History   Increasing slurred speech difficulty swallowing for 1 week presumably secondary to myasthenia gravis exacerbation  History of present illness   Patient is a 86 year old with a known history of myasthenia gravis who has been having increased difficulty with slurred speech and saliva management over the last week.  It is unclear as to whether or not she has been taking her Mestinon or not in her database is not complete.  It is unclear as to her outpatient follow-up since her previous similar admission in November 2020. Evaluation the patient is very difficult to understand speech.  She is able to stick her tongue fully out her mouth.  Her peripheral grip strength is currently good.  She does appear to have some shallow respiratory effort but is not having paroxysmal abdominal movements.  She is also able to speak although unintelligibly in what seems like complete sentences.  Serial NIF in the emergency room have been around -10  Past Medical History   . Arthritis   . Complication of anesthesia    hard to wake up  . Hypertension   . Immune deficiency disorder ([place])   . lt breast ca dx'd 1978 or 1988 pt unsure   lt masectomy; tamoxifen completed  . Myasthenia gravis ([place])      Significant Hospital Events   Admission to [facility] 10/23/2019 patient is a DNR although on my discussion with her she agrees to noninvasive BiPAP ventilation  Consults:  PCCM  Procedures:  NA  Significant Diagnostic Tests:  NA  Micro Data:  NA  Antimicrobials:  NA  Interim history/subjective:  NA  Objective   Blood pressure (!) 168/141, pulse (!) 105, temperature 97.7 F (36.5 C), temperature source Oral, resp. rate 20, SpO2 99 %.       No intake or output data in the 24 hours ending  10/23/19 2142 There were no vitals filed for this visit.  Examination: General: White female in no acute distress HENT: Within normal limits Lungs: Clear but diminished Cardiovascular: Regular Abdomen: Benign soft not tender Extremities: Within normal limits Neuro: Awake alert with slurred speech GU: N/A  Resolved Hospital Problem list   NA  Assessment & Plan:  1.  Myasthenia gravis exacerbation: Currently her biggest problem seems to be swallowing and slurred speech although she is having diminished respiratory excursion.  While she does not want to be intubated or resuscitated in any way she does agree to BiPAP ventilation to assist her breathing until her myasthenia medications have started to have affect.  I agree with monitoring in the ICU setting with BiPAP to bridge her until she shows clinical improvement  2.  Hypertension: Agree with intermittent hydralazine.  If this does not hold her we could use IV Cardene.  3.  We will continue to follow while in ICU.  Best practice:  Diet: N.p.o. for now Pain/Anxiety/Delirium protocol (if indicated): N/A VAP protocol (if indicated): N/A DVT prophylaxis: Lovenox GI prophylaxis: SCD Glucose control: NA Mobility: Bedrest Code Status: DO NOT RESUSCITATE Family Communication: Daughter unable to be reached Disposition: To ICU for initial 24 to 48 hours until stable  Labs   CBC: Recent Labs  Lab 10/23/19 1853 10/23/19 1906 10/23/19 2108  WBC 5.4  --  4.9  NEUTROABS 2.9  --   --   HGB 14.3  14.6 14.1  HCT 43.9 43.0 43.6  MCV 96.7  --  96.7  PLT 224  --  99991111    Basic Metabolic Panel: Recent Labs  Lab 10/23/19 1853 10/23/19 1906  NA 140 139  K 3.7 3.5  CL 103 106  CO2 24  --   GLUCOSE 106* 98  BUN 11 12  CREATININE 0.73 0.70  CALCIUM 10.0  --    GFR: CrCl cannot be calculated (Unknown ideal weight.). Recent Labs  Lab 10/23/19 1853 10/23/19 2108  WBC 5.4 4.9    Liver Function Tests: Recent Labs  Lab  10/23/19 1853  AST 35  ALT 33  ALKPHOS 46  BILITOT 0.8  PROT 7.3  ALBUMIN 4.3   No results for input(s): LIPASE, AMYLASE in the last 168 hours. No results for input(s): AMMONIA in the last 168 hours.  ABG    Component Value Date/Time   HCO3 24.8 08/02/2019 1200   TCO2 26 10/23/2019 1906   O2SAT 60.7 08/02/2019 1200     Coagulation Profile: Recent Labs  Lab 10/23/19 1853  INR 1.1    Cardiac Enzymes: No results for input(s): CKTOTAL, CKMB, CKMBINDEX, TROPONINI in the last 168 hours.  HbA1C: No results found for: HGBA1C  CBG: No results for input(s): GLUCAP in the last 168 hours.  Review of Systems:   Unable to obtain due to slurred speech  Past Medical History  She,  has a past medical history of Arthritis, Complication of anesthesia, Hypertension, Immune deficiency disorder ([place]), lt breast ca (dx'd 1978 or 1988 pt unsure), and Myasthenia gravis ([place]).   Surgical History    Past Surgical History:  Procedure Laterality Date  . ABDOMINAL HYSTERECTOMY    . APPENDECTOMY    . BREAST SURGERY    . ESOPHAGOGASTRODUODENOSCOPY (EGD) WITH PROPOFOL N/A 10/12/2018   Procedure: ESOPHAGOGASTRODUODENOSCOPY (EGD) WITH PROPOFOL;  Surgeon: Otis Brace, MD;  Location: [facility] ENDOSCOPY;  Service: Gastroenterology;  Laterality: N/A;  . EYE SURGERY     lens implant  . HEMORRHOID SURGERY    . masectomny Left    1978  . MASTECTOMY       Social History   reports that she has never smoked. She has never used smokeless tobacco. She reports that she does not drink alcohol.   Family History   Her family history includes Asthma in her father; Colon cancer in her father; Liver cancer in her mother.   Allergies Allergies  Allergen Reactions  . Ciprofloxacin Other (See Comments)    Can not take if patient has myastenia gravis  . Doxycycline Hyclate Other (See Comments)    Patient preference  . Morphine Nausea And Vomiting  . Morphine And Related Nausea And Vomiting    Mad her  extremely sick     Home Medications  Prior to Admission medications   Medication Sig Start Date End Date Taking? Authorizing Provider  acetaminophen (ACETAMINOPHEN 8 HOUR) 650 MG CR tablet Take 1,300 mg by mouth 2 (two) times daily.    [provider]  MELATONIN PO Take 1 tablet by mouth at bedtime as needed (sleep).    [provider]  pyridostigmine (MESTINON) 60 MG tablet Take 60 mg by mouth 4 (four) times daily.     [provider]  vitamin B-12 (CYANOCOBALAMIN) 1000 MCG tablet Take 1,000 mcg by mouth daily.    [provider]     Critical care time: Over 35 minutes spent in bedside evaluation chart review critical care planning and discussing  the case with neurology.

## 2019-10-23 NOTE — ED Notes (Signed)
Pt.'s speech is more slurred. However, pt can follow commands and have movement of tongue and lips. MD notified.

## 2019-10-23 NOTE — ED Triage Notes (Signed)
Pt here for evaluation of weakness, decreased oral intake, and slurred speech. Pt hx dementia/alzheimer's, not able to answer questions at baseline. Spoke with daughter [name] on the phone, reports that symptoms have been progressive. She also reports pt has been having difficulty swallowing recently as well. Daughter also reports hx Myasthenia Gravis.

## 2019-10-23 NOTE — Progress Notes (Signed)
NIF and vital performed parameters are as stated NIF-10 VC-0.7

## 2019-10-24 LAB — BASIC METABOLIC PANEL
Anion gap: 11 (ref 5–15)
BUN: 9 mg/dL (ref 8–23)
CO2: 25 mmol/L (ref 22–32)
Calcium: 9.3 mg/dL (ref 8.9–10.3)
Chloride: 105 mmol/L (ref 98–111)
Creatinine, Ser: 0.63 mg/dL (ref 0.44–1.00)
GFR calc Af Amer: >60 mL/min (ref >60)
GFR calc non Af Amer: >60 mL/min (ref >60)
Glucose, Bld: 106 mg/dL — ABNORMAL HIGH (ref 70–99)
Potassium: 3.9 mmol/L (ref 3.5–5.1)
Sodium: 141 mmol/L (ref 135–145)

## 2019-10-24 LAB — MRSA PCR SCREENING: MRSA by PCR: NEGATIVE

## 2019-10-24 MED ORDER — PANTOPRAZOLE SODIUM 40 MG IV SOLR
40.0000 mg | INTRAVENOUS | Status: DC
  Administered 2019-10-24 – 2019-10-28 (×5): 40 mg via INTRAVENOUS
  Filled 2019-10-24 (×5): qty 40

## 2019-10-24 MED ORDER — CHLORHEXIDINE GLUCONATE CLOTH 2 % EX PADS
6.0000 | MEDICATED_PAD | Freq: Every day | CUTANEOUS | Status: DC
  Administered 2019-10-24 – 2019-10-28 (×4): 6 via TOPICAL

## 2019-10-24 MED ORDER — FUROSEMIDE 10 MG/ML IJ SOLN: 60.0000 mg | Freq: Once | INTRAMUSCULAR | Status: DC

## 2019-10-24 MED ORDER — SODIUM CHLORIDE 0.9 % IV SOLN: INTRAVENOUS | Status: DC

## 2019-10-24 MED ORDER — ORAL CARE MOUTH RINSE
15.0000 mL | Freq: Two times a day (BID) | OROMUCOSAL | Status: DC
  Administered 2019-10-24 – 2019-10-30 (×13): 15 mL via OROMUCOSAL

## 2019-10-24 NOTE — Progress Notes (Signed)
Patient refusing to keep BiPAP mask on, states "I can't breathe with it on" Will continue to monitor.

## 2019-10-24 NOTE — Evaluation (Signed)
Clinical/Bedside Swallow Evaluation Patient Details  Name: Elaine Owens MRN: [id number] Date of Birth: 1926-02-02  Today's Date: 10/24/2019 Time: SLP Start Time (ACUTE ONLY): 1340 SLP Stop Time (ACUTE ONLY): 1400 SLP Time Calculation (min) (ACUTE ONLY): 20 min  Past Medical History:  Past Medical History:  Diagnosis Date  . Arthritis   . Complication of anesthesia    hard to wake up  . Hypertension   . Immune deficiency disorder ([place])   . lt breast ca dx'd 1978 or 1988 pt unsure   lt masectomy; tamoxifen completed  . Myasthenia gravis [facility])    Past Surgical History:  Past Surgical History:  Procedure Laterality Date  . ABDOMINAL HYSTERECTOMY    . APPENDECTOMY    . BREAST SURGERY    . ESOPHAGOGASTRODUODENOSCOPY (EGD) WITH PROPOFOL N/A 10/12/2018   Procedure: ESOPHAGOGASTRODUODENOSCOPY (EGD) WITH PROPOFOL;  Surgeon: Otis Brace, MD;  Location: [facility];  Service: Gastroenterology;  Laterality: N/A;  . EYE SURGERY     lens implant  . HEMORRHOID SURGERY    . masectomny Left    1978  . MASTECTOMY     HPI:      Assessment / Plan / Recommendation Clinical Impression  Patient presents with a moderate oropharyngeal dysphagia which appears to be secondary to decreased oral motor movement and strength leading to decreased lingual movement and manipulation and transit of liquids, as well as patient endorsing discomfort and difficulty during pharyngeal phase of swallow. No overt s/s of aspiraiton or penetration observed. SLP Visit Diagnosis: Dysphagia, unspecified (R13.10)    Aspiration Risk  Moderate aspiration risk    Diet Recommendation     Medication Administration: Via alternative means Supervision: Full supervision/cueing for compensatory strategies Postural Changes: Seated upright at 90 degrees    Other  Recommendations Oral Care Recommendations: Oral care QID;Staff/trained caregiver to provide oral care   Follow up Recommendations Skilled Nursing  facility;Home health SLP      Frequency and Duration min 2x/week  1 week       Prognosis Prognosis for Safe Diet Advancement: Good      Swallow Study   General      Oral/Motor/Sensory Function Overall Oral Motor/Sensory Function: Moderate impairment Facial ROM: Reduced right;Reduced left Facial Symmetry: Within Functional Limits Facial Strength: Reduced right;Reduced left Lingual ROM: Reduced left;Reduced right Lingual Strength: Reduced   Ice Chips     Thin Liquid Thin Liquid: Impaired Presentation: Cup;Straw Oral Phase Impairments: Reduced labial seal;Reduced lingual movement/coordination Oral Phase Functional Implications: Prolonged oral transit Pharyngeal  Phase Impairments: Suspected delayed Swallow Other Comments: Patient only took very small sips and endorsed difficulty and discomfort when doing so.    Nectar Thick     Honey Thick     Puree Puree: Not tested   Solid     Solid: Not tested      Dannial Monarch 10/24/2019,5:47 PM   Sonia Baller, MA, CCC-SLP Speech Therapy [facility] Acute Rehab Pager: [phone]

## 2019-10-24 NOTE — Progress Notes (Signed)
Pt pulled mask off and states she does not want to wear it anymore d/t the fact she feels like she cannot breathe with it on. RT will continue to monitor.

## 2019-10-24 NOTE — Progress Notes (Signed)
NIF -10 cmH2O X3 with good effort, VC 0.4 L/M X3 with good effort.

## 2019-10-24 NOTE — Progress Notes (Addendum)
RT called to bedside for pt unresponsiveness. RT entered room to find pt placed on BIPAP at this time. Pt had previously been placed on BIPAP and removed herself stating she did not want it that she was unable to breathe on it. RT called [facility] MD Stretch and explained the pts situation, that she is a DNR/DNI, that she is here for a MG exacerbation, and that she has been refusing to wear BIPAP intermittently during the day and that day shift CCM MD Duwayne Heck was aware of this. She has been doing well for the majority of the day on RA w/out difficulties. Her NIF was -10 this morning on 2/6. Stretch would like for [place] to keep her on BIPAP until she is more oriented and trying to take the mask off/refusing to keep it on, then obtain an ABG to assess her PCO2 levels, and if normalized call him back so he can notify ground team to come have a conversation w/the pt about [place] and whether or not she is capable of making these decisions. RT will continue to monitor.

## 2019-10-24 NOTE — Progress Notes (Addendum)
NEURO HOSPITALIST PROGRESS NOTE   Subjective: Awake, alert, NAD on RA. Shallow breathing without accessory muscle usage.  NIF: -10 x 3 with good effort VC: 0.4 L/ M x 3 with good effort  Exam: Vitals:   10/24/19 0900 10/24/19 1000  BP: (!) 156/71 133/64  Pulse: 78 77  Resp: 16   Temp:    SpO2: 97% 97%    Physical Exam  Constitutional: Appears well-developed and well-nourished.  Psych: Affect appropriate to situation Eyes: Normal external eye and conjunctiva. HENT: Normocephalic, no lesions, without obvious abnormality.   Musculoskeletal-no joint tenderness, deformity or swelling Cardiovascular: s1, S2 heard Normal rate and regular rhythm.  Respiratory: CTAB, shallow, non-labored breathing saturations WNL on RA GI: Soft.  No distension. There is no tenderness.  Skin: WDI, with diffuse moles   Neuro:  Mental Status: Alert, oriented to name/age/month/year, thought content appropriate.  Speech fluent without evidence of aphasia.  Able to follow  commands without difficulty. Cranial Nerves: II:  Visual fields grossly normal,  III,IV, VI: ptosis not present, extra-ocular motions intact bilaterally pupils equal, round, reactive to light and accommodation V,VII: smile symmetric, facial light touch sensation normal bilaterally VIII: hearing normal bilaterally IX,X: uvula rises symmetrically XI: bilateral shoulder shrug XII: midline tongue extension Motor: Right : Upper extremity   5/5 Left:     Upper extremity   5/5  Lower extremity   5/5  Lower extremity   5/5 Normal tone throughout; no atrophy noted Can count to 20 with one breath Of note, her strength is rapidly fatigueable in upper extremities Sensory:  light touch intact throughout, bilaterally Deep Tendon Reflexes: 2+ and symmetric biceps, patellae Plantars: Right: downgoing   Left: downgoing Cerebellar: No ataxia with FNF Gait: deferred    Medications:  Scheduled: . Chlorhexidine Gluconate  Cloth  6 each Topical Daily  . enoxaparin (LOVENOX) injection  40 mg Subcutaneous Q24H  . mouth rinse  15 mL Mouth Rinse BID  . pyridostigmine  2 mg Intravenous Q6H   Continuous: . Immune Globulin 10% 20 g (10/24/19 0201)   [id number] **OR** acetaminophen, hydrALAZINE  Pertinent Labs/Diagnostics: TSH: WNL UA: +leukocytes  DG Chest Portable 1 View  Result Date: 10/23/2019 CLINICAL DATA:  Shortness of breath EXAM: PORTABLE CHEST 1 VIEW COMPARISON:  September 15, 2019 FINDINGS: The heart size and mediastinal contours are within normal limits. Aortic knob calcifications are seen. Mildly increased interstitial markings seen at both lung bases as on prior exam, likely chronic lung changes. No large airspace consolidation or pleural effusion. No acute osseous abnormality. IMPRESSION: No active disease. Electronically Signed   By: Prudencio Pair M.D.   On: 10/23/2019 21:36   Impression: 86 yo F with MG crisis as evidenced by poor NIFs and FVC, diplopia on sustained upgaze, poor palatal movement and dysphagia.  Overall clinical presentation most consistent with myasthenic crisis. Her numbers are at the stage where elective intubation would be considered, however she has indicated that she would not want intubation even in a lifesaving situation. -- Consideration of noninvasive ventilation may be reasonable, and therefore we asked pulmonary for their input regarding noninvasive ventilation. She was briefly on BIPAP but subsequently refused to continue this. -- She was also quite hypertensive on admission. Given her dysphagia should avoid tablets for the time being so will use hydralazine.  Beta-blockers can cause transient worsening of myasthenia and will be avoided.  Plan: Myasthenic exacerbation: -- IVIG 400 mg/kg x 5 days for myasthenic exacerbation -- Mestinon syrup 60 mg every 6 hours Acute respiratory failure: -- Pulmonary consulted for consideration of noninvasive ventilation and  respiratory failure. Patient refused further BIPAP after a trial -- Scheduled NIFs and FVC Essential HTN: -- Hydralazine 2.5 mg IV every 6 hours as needed for hypertensive urgency Dysphagia: SLP consult Aspiration precautions Code status: DNR/DNI. Full treatment up to ventilation/resuscitation  DVT Prophylaxis: Lovenox D/c disposition: TBD  Laurey Morale, MSN, NP-C [facility] [phone]  I have seen and examined the patient. I have discussed the assessment and plan with the patient and reviewed/discussed with the Neurohospitalist PA.  Electronically signed: Dr. Kerney Elbe 10/24/2019, 10:21 AM

## 2019-10-24 NOTE — Progress Notes (Signed)
NAME:  Elaine Owens, MRN:  [id number], DOB:  June 22, 1926, LOS: 1 ADMISSION DATE:  10/23/2019, CONSULTATION DATE: 10/23/2019 REFERRING MD: Leonel Ramsay, CHIEF COMPLAINT: Slurred speech  Brief History   Increasing slurred speech difficulty swallowing for 1 week presumably secondary to myasthenia gravis exacerbation  History of present illness   Patient is a 86 year old with a known history of myasthenia gravis who has been having increased difficulty with slurred speech and saliva management over the last week.  It is unclear as to whether or not she has been taking her Mestinon or not in her database is not complete.  It is unclear as to her outpatient follow-up since her previous similar admission in November 2020. Evaluation the patient is very difficult to understand speech.  She is able to stick her tongue fully out her mouth.  Her peripheral grip strength is currently good.  She does appear to have some shallow respiratory effort but is not having paroxysmal abdominal movements.  She is also able to speak although unintelligibly in what seems like complete sentences.  Serial NIF in the emergency room have been around -10  Past Medical History   . Arthritis   . Complication of anesthesia    hard to wake up  . Hypertension   . Immune deficiency disorder ([place])   . lt breast ca dx'd 1978 or 1988 pt unsure   lt masectomy; tamoxifen completed  . Myasthenia gravis ([place])      Significant Hospital Events   Admission to [facility] 10/23/2019 patient is a DNR although on my discussion with her she agrees to noninvasive BiPAP ventilation  Consults:  PCCM  Procedures:  NA  Significant Diagnostic Tests:  NA  Micro Data:  NA  Antimicrobials:  NA  Interim history/subjective:  2/5: started on mestinon, started IVIG at 2am (2/6) 2/6:  Today is alert and able to answer simple questions.  Breathing well on my gross evaluation.    Objective   Blood pressure 133/64, pulse  77, temperature 98 F (36.7 C), temperature source Axillary, resp. rate 16, weight 51.9 kg, SpO2 97 %.    FiO2 (%):  [40 %] 40 %   Intake/Output Summary (Last 24 hours) at 10/24/2019 1053 Last data filed at 10/24/2019 0600 Gross per 24 hour  Intake --  Output 450 ml  Net -450 ml   Filed Weights   10/24/19 0043 10/24/19 0500  Weight: 52 kg 51.9 kg    Examination: General: White female in no acute distress HENT: Within normal limits Lungs: Clear  Cardiovascular: Regular Abdomen: Benign soft not tender Extremities: Within normal limits Neuro: Awake alert with slurred speech GU: N/A  Resolved Hospital Problem list   NA  Assessment & Plan:  1.  Myasthenia gravis exacerbation: Seems to be breathing well at this time.  Has intermittently refused bipap.   On IVIG and mestinon.   2.  Hypertension:  On hydralazine .  3.  We will continue to follow while in ICU.  Consider tx out of ICu if remains stable tomorrow.   Will watch her here for one more night.   Best practice:  Diet: pending speech eval.  Pain/Anxiety/Delirium protocol (if indicated): N/A VAP protocol (if indicated): N/A DVT prophylaxis: Lovenox GI prophylaxis: protonix Glucose control: NA Mobility: Bedrest Code Status: DO NOT RESUSCITATE Family Communication: Daughter  Disposition: To ICU for initial 24 to 48 hours until stable  Labs   CBC: Recent Labs  Lab 10/23/19 1853 10/23/19 1906 10/23/19 2108  WBC 5.4  --  4.9  NEUTROABS 2.9  --   --   HGB 14.3 14.6 14.1  HCT 43.9 43.0 43.6  MCV 96.7  --  96.7  PLT 224  --  [id number]    Basic Metabolic Panel: Recent Labs  Lab 10/23/19 1853 10/23/19 1906 10/24/19 0346  NA 140 139 141  K 3.7 3.5 3.9  CL 103 106 105  CO2 24  --  25  GLUCOSE 106* 98 106*  BUN 11 12 9   CREATININE 0.73 0.70 0.63  CALCIUM 10.0  --  9.3   GFR: Estimated Creatinine Clearance: 34.6 mL/min (by C-G formula based on SCr of 0.63 mg/dL). Recent Labs  Lab 10/23/19 1853  10/23/19 2108  WBC 5.4 4.9    Liver Function Tests: Recent Labs  Lab 10/23/19 1853  AST 35  ALT 33  ALKPHOS 46  BILITOT 0.8  PROT 7.3  ALBUMIN 4.3   No results for input(s): LIPASE, AMYLASE in the last 168 hours. No results for input(s): AMMONIA in the last 168 hours.  ABG    Component Value Date/Time   HCO3 24.8 08/02/2019 1200   TCO2 26 10/23/2019 1906   O2SAT 60.7 08/02/2019 1200     Coagulation Profile: Recent Labs  Lab 10/23/19 1853  INR 1.1    Cardiac Enzymes: No results for input(s): CKTOTAL, CKMB, CKMBINDEX, TROPONINI in the last 168 hours.  HbA1C: No results found for: HGBA1C  CBG: No results for input(s): GLUCAP in the last 168 hours.  Review of Systems:   Unable to obtain due to slurred speech  Past Medical History  She,  has a past medical history of Arthritis, Complication of anesthesia, Hypertension, Immune deficiency disorder ([place]), lt breast ca (dx'd 1978 or 1988 pt unsure), and Myasthenia gravis ([place]).   Surgical History    Past Surgical History:  Procedure Laterality Date  . ABDOMINAL HYSTERECTOMY    . APPENDECTOMY    . BREAST SURGERY    . ESOPHAGOGASTRODUODENOSCOPY (EGD) WITH PROPOFOL N/A 10/12/2018   Procedure: ESOPHAGOGASTRODUODENOSCOPY (EGD) WITH PROPOFOL;  Surgeon: Otis Brace, MD;  Location: [facility] ENDOSCOPY;  Service: Gastroenterology;  Laterality: N/A;  . EYE SURGERY     lens implant  . HEMORRHOID SURGERY    . masectomny Left    1978  . MASTECTOMY       Social History   reports that she has never smoked. She has never used smokeless tobacco. She reports that she does not drink alcohol.   Family History   Her family history includes Asthma in her father; Colon cancer in her father; Liver cancer in her mother.   Allergies Allergies  Allergen Reactions  . Ciprofloxacin Other (See Comments)    Can not take if patient has myastenia gravis  . Doxycycline Hyclate Other (See Comments)    Patient preference  . Morphine  Nausea And Vomiting  . Morphine And Related Nausea And Vomiting    Mad her extremely sick     Home Medications  Prior to Admission medications   Medication Sig Start Date End Date Taking? Authorizing Provider  acetaminophen (ACETAMINOPHEN 8 HOUR) 650 MG CR tablet Take 1,300 mg by mouth 2 (two) times daily.    [provider]  MELATONIN PO Take 1 tablet by mouth at bedtime as needed (sleep).    [provider]  pyridostigmine (MESTINON) 60 MG tablet Take 60 mg by mouth 4 (four) times daily.     [provider]  vitamin B-12 (CYANOCOBALAMIN) 1000  MCG tablet Take 1,000 mcg by mouth daily.    [provider]     Critical care time: Over 35 minutes spent in bedside evaluation chart review critical care planning.

## 2019-10-24 NOTE — Progress Notes (Signed)
Pt agreed to placement of BIPAP V60 at this time. RT placed pt on BIPAP and she is resting comfortably, respiratory status is stable at this time. RT will continue to monitor.

## 2019-10-24 NOTE — Progress Notes (Signed)
RT placed pt on BIPAP V60 at this time. Pt is very agitated and is tugging at mask. Pt most recent NIF -10. RT will continue to monitor.

## 2019-10-25 LAB — POCT I-STAT 7, (LYTES, BLD GAS, ICA,H+H)
Acid-base deficit: 1 mmol/L (ref 0.0–2.0)
Bicarbonate: 24.1 mmol/L (ref 20.0–28.0)
Calcium, Ion: 1.27 mmol/L (ref 1.15–1.40)
HCT: 35 % — ABNORMAL LOW (ref 36.0–46.0)
Hemoglobin: 11.9 g/dL — ABNORMAL LOW (ref 12.0–15.0)
O2 Saturation: 95 %
Patient temperature: 98.4
Potassium: 3.8 mmol/L (ref 3.5–5.1)
Sodium: 136 mmol/L (ref 135–145)
TCO2: 25 mmol/L (ref 22–32)
pCO2 arterial: 39.2 mmHg (ref 32.0–48.0)
pH, Arterial: 7.397 (ref 7.350–7.450)
pO2, Arterial: 73 mmHg — ABNORMAL LOW (ref 83.0–108.0)

## 2019-10-25 MED ORDER — DEXTROSE-NACL 5-0.45 % IV SOLN: INTRAVENOUS | Status: DC

## 2019-10-25 MED ORDER — DEXTROSE-NACL 5-0.45 % IV SOLN: INTRAVENOUS | Status: AC

## 2019-10-25 NOTE — Evaluation (Signed)
Clinical/Bedside Swallow Evaluation Patient Details  Name: Elaine Owens MRN: [id number] Date of Birth: [birth year]  Today's Date: 10/25/2019 Time: SLP Start Time (ACUTE ONLY): [id number] SLP Stop Time (ACUTE ONLY): 1400 SLP Time Calculation (min) (ACUTE ONLY): 22 min  Past Medical History:  Past Medical History:  Diagnosis Date  . Arthritis   . Complication of anesthesia    hard to wake up  . Hypertension   . Immune deficiency disorder ([place])   . lt breast ca dx'd 1978 or 1988 pt unsure   lt masectomy; tamoxifen completed  . Myasthenia gravis [facility])    Past Surgical History:  Past Surgical History:  Procedure Laterality Date  . ABDOMINAL HYSTERECTOMY    . APPENDECTOMY    . BREAST SURGERY    . ESOPHAGOGASTRODUODENOSCOPY (EGD) WITH PROPOFOL N/A 10/12/2018   Procedure: ESOPHAGOGASTRODUODENOSCOPY (EGD) WITH PROPOFOL;  Surgeon: Otis Brace, MD;  Location: [facility] ENDOSCOPY;  Service: Gastroenterology;  Laterality: N/A;  . EYE SURGERY     lens implant  . HEMORRHOID SURGERY    . masectomny Left    1978  . MASTECTOMY     HPI:  Shaylinn Zurlo is a 86 y.o. female with a history of myasthenia gravis, but I am not certain where was diagnosed, who is managed chronically on Mestinon who has been having increasing difficulty swallowing over the past few days.  I am uncertain if she is taking her medicine or not.  I tried calling her daughter several times without answer.  I am not sure where she is currently getting her Mestinon, she states that she does not have a neurologist.  She was admitted due to trouble swallowing.  Chest xray dated 10/23/19 was showing no active disease.     Assessment / Plan / Recommendation Clinical Impression  Repeat clinical swallowing evaluation was completed to assess for readiness for PO intake using thin liquids via spoon and self fed cup sips and pureed material.  The patient endorsed trouble swallowing prior to admission with foods primarily.  She is known to [facility]  service from previous admission with clinical swallowing evaluation completed on 08/03/19 with recomendation for a regular diet with thin liquids.  Cranial nerve exam was completed and remarkable for decreased lingual range of motion and strength.  Labial, facial and jaw range of motion and strength appeared adequate.  Facial sensation appeared to be intact with the patient not endorsing any difference in sensation between the right and left side of her face.  She presented with a possible oroharyngeal dysphagia as well as a possible esophageal dysphagia.  Some anterior escape was seen given thin liquids.  She appeared to be slow orally to manipulate material.  Swallow trigger was appreciated to palpation but felt reduced.  Delayed throat clearing was seen given thin liquids via self fed cup sip.  Following swallow of pureed material the patient moaned and clutched her chest.  She complained of a sensation of material stuck for many minutes.  Use of thin liquids via spoon sip appeared to ease her discomfort.  Recommend she remain NPO except ice chips.  ST will follow up next date to assess for PO readiness vs need for MBS.         Aspiration Risk  Moderate aspiration risk    Diet Recommendation   NPO x ice chips  Medication Administration: Via alternative means    Other  Recommendations Oral Care Recommendations: Oral care QID;Staff/trained caregiver to provide oral care;Oral care prior to ice chip/H20  Follow up Recommendations (TBD)      Frequency and Duration min 2x/week  2 weeks       Prognosis Prognosis for Safe Diet Advancement: Good      Swallow Study   General Date of Onset: 10/23/19 HPI: Amoria Eschete is a 86 y.o. female with a history of myasthenia gravis, but I am not certain where was diagnosed, who is managed chronically on Mestinon who has been having increasing difficulty swallowing over the past few days.  I am uncertain if she is taking her medicine or not.  I tried  calling her daughter several times without answer.  I am not sure where she is currently getting her Mestinon, she states that she does not have a neurologist.  She was admitted due to trouble swallowing.  Chest xray dated 10/23/19 was showing no active disease.   Type of Study: Bedside Swallow Evaluation Previous Swallow Assessment: Clinical swallowing evaluation 11/156/20 with recommendation for a regular diet with thin liquids.   Diet Prior to this Study: NPO Temperature Spikes Noted: No Respiratory Status: Room air History of Recent Intubation: No Behavior/Cognition: Alert;Cooperative;Pleasant mood Oral Cavity Assessment: Within Functional Limits Oral Care Completed by SLP: No Oral Cavity - Dentition: Edentulous Self-Feeding Abilities: Needs assist Patient Positioning: Upright in bed Baseline Vocal Quality: Low vocal intensity Volitional Cough: (not tested) Volitional Swallow: Able to elicit    Oral/Motor/Sensory Function Overall Oral Motor/Sensory Function: Mild impairment Facial ROM: Within Functional Limits Facial Symmetry: Within Functional Limits Facial Strength: Within Functional Limits Facial Sensation: (not tested) Lingual ROM: Reduced right;Reduced left Lingual Symmetry: Within Functional Limits Lingual Strength: Reduced Lingual Sensation: (not tested) Velum: (not tested) Mandible: Within Functional Limits   Ice Chips Ice chips: Not tested   Thin Liquid Thin Liquid: Impaired Presentation: Spoon Oral Phase Impairments: Reduced labial seal Oral Phase Functional Implications: Right anterior spillage;Prolonged oral transit Pharyngeal  Phase Impairments: Suspected delayed Swallow;Throat Clearing - Delayed    Nectar Thick Nectar Thick Liquid: Not tested   Honey Thick Honey Thick Liquid: Not tested   Puree Puree: Impaired Presentation: Spoon Oral Phase Impairments: Impaired mastication Oral Phase Functional Implications: Prolonged oral transit Pharyngeal Phase  Impairments: Suspected delayed Swallow;Other (comments)   Solid     Solid: Not tested     Shelly Flatten, MA, [place] SLP [phone]  Lamar Sprinkles 10/25/2019,2:18 PM

## 2019-10-25 NOTE — Progress Notes (Addendum)
NEURO HOSPITALIST PROGRESS NOTE   Subjective:  Patient awake,alert, NAD. Still breathing shallowly but with no accessory muscle usage. Refusing BIPAP at baseline, but when she had an episode of confusion during nighttime hours last night, she allowed BIPAP followed by improvement in her mentation. Able to sustain upward gaze for greater than 15 seconds which is improved from yesterday.  Objective:  NIF: -10 x 3 with good effort VC: 500 ml x 3 with good effort  Exam: Vitals:   10/25/19 0800 10/25/19 0900  BP:    Pulse: 69   Resp: (!) 24 (!) 31  Temp: 98.2 F (36.8 C)   SpO2: 96%     Physical Exam  Constitutional: Appears well-developed and well-nourished for her age.  Eyes: Normal external eye and conjunctiva. HENT: Normocephalic, no lesions, without obvious abnormality.   Cardiovascular: s1, S2 heard Normal rate and regular rhythm.  Respiratory: CTAB, shallow, non-labored breathing saturations WNL on RA GI: Soft.  No distension. There is no tenderness.  Skin: WDI, with diffuse moles  Neuro:  Mental Status: Alert, oriented to name/age/month/year, thought content appropriate.  Speech fluent without evidence of aphasia.  Able to follow  commands without difficulty. Cranial Nerves: II:  Visual fields grossly normal. PERRL III,IV, VI: Ptosis not present. EOMI.   V,VII: Smile symmetric, facial light touch sensation intact bilaterally VIII: Hearing intact to voice IX,X: Palate rises symmetrically XI: Symmetric shoulder shrug XII: Midline tongue extension Motor: Able to lift all 4 extremities antigravity. Grip 4+/5 bilaterally, flexion at elbows 5/5 bilaterally, shoulder girdle muscles 4-/5 bilaterally BLE 4+/5 hip flexion and knee extension bilaterally.  Normal tone throughout; no atrophy noted Can count to 20 with one breath Sensory:  Light touch intact throughout, bilaterally Deep Tendon Reflexes: 2+ and symmetric biceps, patellae Plantars: Right:  downgoing   Left: downgoing Cerebellar: No ataxia with FNF Gait: Deferred    Medications:  Scheduled: . Chlorhexidine Gluconate Cloth  6 each Topical Daily  . enoxaparin (LOVENOX) injection  40 mg Subcutaneous Q24H  . mouth rinse  15 mL Mouth Rinse BID  . pantoprazole (PROTONIX) IV  40 mg Intravenous Q24H  . pyridostigmine  2 mg Intravenous Q6H   Continuous: . sodium chloride 75 mL/hr at 10/25/19 0900  . Immune Globulin 10% Stopped (10/24/19 2350)   [id number] **OR** acetaminophen, hydrALAZINE  Pertinent Labs/Diagnostics:  TSH: WNL UA: +leukocytes  DG Chest Portable 1 View  Result Date: 10/23/2019 CLINICAL DATA:  Shortness of breath EXAM: PORTABLE CHEST 1 VIEW COMPARISON:  September 15, 2019 FINDINGS: The heart size and mediastinal contours are within normal limits. Aortic knob calcifications are seen. Mildly increased interstitial markings seen at both lung bases as on prior exam, likely chronic lung changes. No large airspace consolidation or pleural effusion. No acute osseous abnormality. IMPRESSION: No active disease. Electronically Signed   By: Prudencio Pair M.D.   On: 10/23/2019 21:36   Impression: 86 yo F with myasthenia gravis crisis as evidenced by poor NIFs and FVC on admission, with diplopia on sustained upgaze, poor palatal movement and dysphagia.  Overall clinical presentation most consistent with myasthenic crisis. Her numbers on admission were at the stage where elective intubation would be considered, however she has indicated that she would not want intubation even in a lifesaving situation. -- Consideration of noninvasive ventilation was made on admission. We therefore asked pulmonary for their input regarding noninvasive ventilation.  She was briefly on BIPAP but subsequently refused to continue this, except for overnight on Sat/Sun, when she became altered and allowed BIPAP placement, followed by improvement in her mentation. Most likely she was hypercarbic due to  diaphragmatic weakness, which resolved with the BIPAP. Again, she refuses intubation and has electively refused BIPAP.  -- She was also quite hypertensive on admission. Given her dysphagia should avoid tablets for the time being so will use hydralazine.  Beta-blockers can cause transient worsening of myasthenia and will be avoided.    Plan: Myasthenic exacerbation: -- IVIG 400 mg/kg x 5 days for myasthenic exacerbation. Tonight will be her 3rd dose. Last dose on Tuesday night.  -- Mestinon injection 2 mg q6h  -- Have discussed the benefits of BIPAP at night with patient. Her dementia limits her comprehension regarding this. She is adamant about not wearing it during the day, but did consent to nighttime use given her episode of confusion likely secondary to hypercarbia last night. RN feels that due to her memory dysfunction, the patient will likely forget the conversation and refuse BIPAP again later tonight. Family has expressed wish as well for no intubation and to respect patient's wishes regarding BIPAP.  -- Frequent neuro checks  -- Pulse ox to be worn at all times   Acute respiratory failure: -- Pulmonary consulted for consideration of noninvasive ventilation and respiratory failure. Patient refused further BIPAP after a trial -- CCM is following -- Scheduled NIFs and FVC -- Dr. Leonel Ramsay discussed with family on admission that due to her MG and refusal to be intubated, she may suffer an acute event of respiratory decompensation this hospitalization which may be fatal or carry risk of severe long term morbidity. Family expressed understanding and reiterated her DNR status, which includes no intubation.   Essential HTN: -- Hydralazine 2.5 mg IV every 6 hours as needed for hypertensive urgency  Dysphagia: SLP consult Aspiration precautions D5 1/2 NS at 50 cc/hr CBG q6h  Code status: DNR/DNI. Full treatment up to ventilation/resuscitation  DVT Prophylaxis: Lovenox D/c disposition:  TBD  Laurey Morale, MSN, NP-C [facility] [phone]  40 minutes was spent in the care of the patient today, which included multiple bedside visits, coordination of care and patient counseling.   Electronically signed: Dr. Kerney Elbe

## 2019-10-25 NOTE — Progress Notes (Signed)
PCCM interval progress note:  Pt refusing Bipap, I spoke with both Dr. Mariane Masters, admitting physician, and Dr. Patsey Berthold this morning who both confirmed that patient has capacity to refuse.  Will d/c order for tonight, can re-order if patient is willing.   Otilio Carpen Acxel Dingee, PA-C

## 2019-10-25 NOTE — Progress Notes (Signed)
RT obtained ABG per MD Stretch request for when pt came off of BIPAP. The following results were obtained. MD request a call with results this morning so that ground team could come and evaluated [place] with her. PT ABG results were as follows.   Results for Elaine Owens, Elaine Owens (MRN [id number]) as of 10/25/2019 06:24  Ref. Range 10/25/2019 06:22  Sample type Unknown ARTERIAL  pH, Arterial Latest Ref Range: 7.350 - 7.450  7.397  pCO2 arterial Latest Ref Range: 32.0 - 48.0 mmHg 39.2  pO2, Arterial Latest Ref Range: 83.0 - 108.0 mmHg 73.0 (L)  TCO2 Latest Ref Range: 22 - 32 mmol/L 25  Acid-base deficit Latest Ref Range: 0.0 - 2.0 mmol/L 1.0  Bicarbonate Latest Ref Range: 20.0 - 28.0 mmol/L 24.1  O2 Saturation Latest Units: % 95.0  Patient temperature Unknown 98.4 F  Collection site Unknown RADIAL, ALLEN'S TEST ACCEPTABLE

## 2019-10-25 NOTE — Progress Notes (Signed)
Pt pulled herself off of BIPAP. RT informed pts RN of situation. RT placed her back on [place] 2Lpm. RT will continue to monitor.

## 2019-10-25 NOTE — Progress Notes (Signed)
Since SLP consult, patient has been complaining of something "stuck in her (points to upper sternum)."  This nurse attempted to have her cough hard and also clear her throat with no success. This nurse provided a small amount of water on a spoon and patient aspirated. Patient was gasping. O2 dropped to mid-70s, BP elevated and HR decreased to 40-50s. This nurse placed patient on 4L [place].  Once episode was over, this nurse sat with patient to provide comfort until patient was ready to rest. Vitals returned to stable.

## 2019-10-25 NOTE — Progress Notes (Signed)
Pt allowed RT to place her on BIPAP V60 for the night. Pt is resting comfortably on BIPAP w/out agitation at this time. RT will continue to monitor.

## 2019-10-25 NOTE — Progress Notes (Signed)
Pt still somewhat compliant with wearing BIPAP still this morning. RN is able to convince her to keep it on. The more alert and oriented the pt becomes the harder it is to keep it on her. Will communicate information to day shift RT. RT will continue monitor.

## 2019-10-25 NOTE — Progress Notes (Signed)
Called to patient's room per members of clinical team due to patient becoming minimally responsive after attempting to get OOB. Upon assessment, patient minimally responsive and initially not following any commands. Patient was placed on Bipap prior to my arrival into room. Within minutes patient's mental status improved, with patient following simple commands. Review of telemetry noted patient's heart rhythm at 2338 Afib; 12 lead EKG obtained at 2345 -Sinus rhythm. Patient alert, oriented to self responding to commands, bipap on (although patient frequently attempting to remove). Vital signs as noted in flowsheet documentation. Dr Gillermina Phy [place] Lacy) notified per Respiratory Therapist.

## 2019-10-25 NOTE — Progress Notes (Signed)
NAME:  Elaine Owens, MRN:  [id number], DOB:  26-Dec-1925, LOS: 2 ADMISSION DATE:  10/23/2019, CONSULTATION DATE: 10/23/2019 REFERRING MD: Leonel Ramsay, CHIEF COMPLAINT: Slurred speech  Brief History   Increasing slurred speech difficulty swallowing for 1 week presumably secondary to myasthenia gravis exacerbation  History of present illness   Patient is a 86 year old with a known history of myasthenia gravis who has been having increased difficulty with slurred speech and saliva management over the last week.  It is unclear as to whether or not she has been taking her Mestinon or not in her database is not complete.  It is unclear as to her outpatient follow-up since her previous similar admission in November 2020. Evaluation the patient is very difficult to understand speech.  She is able to stick her tongue fully out her mouth.  Her peripheral grip strength is currently good.  She does appear to have some shallow respiratory effort but is not having paroxysmal abdominal movements.  She is also able to speak although unintelligibly in what seems like complete sentences.  Serial NIF in the emergency room have been around -10  Past Medical History   . Arthritis   . Complication of anesthesia    hard to wake up  . Hypertension   . Immune deficiency disorder ([place])   . lt breast ca dx'd 1978 or 1988 pt unsure   lt masectomy; tamoxifen completed  . Myasthenia gravis ([place])      Significant Hospital Events   2/5: Admission to [facility] 10/23/2019 patient is a DNR although on my discussion with her she agrees to noninvasive BiPAP ventilation   Consults:  PCCM  Procedures:  NA  Significant Diagnostic Tests:  NA  Micro Data:  NA  Antimicrobials:  NA  Interim history/subjective:  2/5: started on mestinon, started IVIG at 2am (2/6) 2/6:  Today is alert and able to answer simple questions.  Breathing well on my gross evaluation.   2/7: Nif still low per RT report,  still not passing swallow.  Awake and alert, pleasant  Objective   Blood pressure (!) 128/54, pulse 69, temperature 98.2 F (36.8 C), temperature source Oral, resp. rate (!) 31, weight 51.6 kg, SpO2 96 %.    FiO2 (%):  [40 %] 40 %   Intake/Output Summary (Last 24 hours) at 10/25/2019 0945 Last data filed at 10/25/2019 0900 Gross per 24 hour  Intake 1612.81 ml  Output 600 ml  Net 1012.81 ml   Filed Weights   10/24/19 0043 10/24/19 0500 10/25/19 0423  Weight: 52 kg 51.9 kg 51.6 kg    Examination: General: White female in no acute distress HENT: Within normal limits Lungs: Clear  Cardiovascular: Regular Abdomen: Benign soft not tender Extremities: Within normal limits Neuro: Awake alert with slurred speech GU: N/A  Resolved Hospital Problem list   NA  Assessment & Plan:  1.  Myasthenia gravis exacerbation: Seems to be breathing well at this time.  Has intermittently refused bipap.   On IVIG and mestinon.  Discussed situation with daughters.  Plan for DNR/DNI, trials of Bipap of if patient agrees.  If patient refuses BIPAP or is not tolerating it, OK to respect her wishes, even if she is confused.   IF she develops respiratory distress or otherwise declines, will need to discuss with daughters and at that point would need to evaluate for appropriateness for comfort care.    They also do not want to pursue feeding tube, short term or long term.  Long term care at this point will depend on whether or not she improves enough to safely eat after treatment for her MG crisis.    2.  Hypertension:  On hydralazine .  3.  We will continue to follow while in ICU.  Remains in ICU at discretion of Neurology.    Best practice:  Diet: NPO. On D51/2 nl right now.  Pain/Anxiety/Delirium protocol (if indicated): N/A VAP protocol (if indicated): N/A DVT prophylaxis: Lovenox GI prophylaxis: protonix Glucose control: NA Mobility: Bedrest Code Status: DO NOT RESUSCITATE Family  Communication: Daughters at bedside  Disposition: To ICU for initial 24 to 48 hours until stable  Labs   CBC: Recent Labs  Lab 10/23/19 1853 10/23/19 1906 10/23/19 2108 10/25/19 0622  WBC 5.4  --  4.9  --   NEUTROABS 2.9  --   --   --   HGB 14.3 14.6 14.1 11.9*  HCT 43.9 43.0 43.6 35.0*  MCV 96.7  --  96.7  --   PLT 224  --  198  --     Basic Metabolic Panel: Recent Labs  Lab 10/23/19 1853 10/23/19 1906 10/24/19 0346 10/25/19 0622  NA 140 139 141 136  K 3.7 3.5 3.9 3.8  CL 103 106 105  --   CO2 24  --  25  --   GLUCOSE 106* 98 106*  --   BUN 11 12 9   --   CREATININE 0.73 0.70 0.63  --   CALCIUM 10.0  --  9.3  --    GFR: Estimated Creatinine Clearance: 34.6 mL/min (by C-G formula based on SCr of 0.63 mg/dL). Recent Labs  Lab 10/23/19 1853 10/23/19 2108  WBC 5.4 4.9    Liver Function Tests: Recent Labs  Lab 10/23/19 1853  AST 35  ALT 33  ALKPHOS 46  BILITOT 0.8  PROT 7.3  ALBUMIN 4.3   No results for input(s): LIPASE, AMYLASE in the last 168 hours. No results for input(s): AMMONIA in the last 168 hours.  ABG    Component Value Date/Time   PHART 7.397 10/25/2019 0622   PCO2ART 39.2 10/25/2019 0622   PO2ART 73.0 (L) 10/25/2019 0622   HCO3 24.1 10/25/2019 0622   TCO2 25 10/25/2019 0622   ACIDBASEDEF 1.0 10/25/2019 0622   O2SAT 95.0 10/25/2019 0622     Coagulation Profile: Recent Labs  Lab 10/23/19 1853  INR 1.1    Cardiac Enzymes: No results for input(s): CKTOTAL, CKMB, CKMBINDEX, TROPONINI in the last 168 hours.  HbA1C: No results found for: HGBA1C  CBG: No results for input(s): GLUCAP in the last 168 hours.  Review of Systems:   Unable to obtain due to slurred speech  Past Medical History  She,  has a past medical history of Arthritis, Complication of anesthesia, Hypertension, Immune deficiency disorder ([place]), lt breast ca (dx'd 1978 or 1988 pt unsure), and Myasthenia gravis ([place]).   Surgical History    Past Surgical History:    Procedure Laterality Date  . ABDOMINAL HYSTERECTOMY    . APPENDECTOMY    . BREAST SURGERY    . ESOPHAGOGASTRODUODENOSCOPY (EGD) WITH PROPOFOL N/A 10/12/2018   Procedure: ESOPHAGOGASTRODUODENOSCOPY (EGD) WITH PROPOFOL;  Surgeon: Otis Brace, MD;  Location: [facility] ENDOSCOPY;  Service: Gastroenterology;  Laterality: N/A;  . EYE SURGERY     lens implant  . HEMORRHOID SURGERY    . masectomny Left    1978  . MASTECTOMY       Social History   reports that she  has never smoked. She has never used smokeless tobacco. She reports that she does not drink alcohol.   Family History   Her family history includes Asthma in her father; Colon cancer in her father; Liver cancer in her mother.   Allergies Allergies  Allergen Reactions  . Ciprofloxacin Other (See Comments)    Can not take if patient has myastenia gravis  . Doxycycline Hyclate Other (See Comments)    Patient preference  . Morphine Nausea And Vomiting  . Morphine And Related Nausea And Vomiting    Mad her extremely sick     Home Medications  Prior to Admission medications   Medication Sig Start Date End Date Taking? Authorizing Provider  acetaminophen (ACETAMINOPHEN 8 HOUR) 650 MG CR tablet Take 1,300 mg by mouth 2 (two) times daily.    [provider]  MELATONIN PO Take 1 tablet by mouth at bedtime as needed (sleep).    [provider]  pyridostigmine (MESTINON) 60 MG tablet Take 60 mg by mouth 4 (four) times daily.     [provider]  vitamin B-12 (CYANOCOBALAMIN) 1000 MCG tablet Take 1,000 mcg by mouth daily.    [provider]     Critical care time: Over 35 minutes spent in bedside evaluation chart review critical care planning.     Collier Bullock

## 2019-10-25 NOTE — Progress Notes (Signed)
NIF -10 cm H2O X3, VC 500 ml, X3 with good effort

## 2019-10-26 DIAGNOSIS — I4891 Unspecified atrial fibrillation: Secondary | ICD-10-CM

## 2019-10-26 DIAGNOSIS — G7000 Myasthenia gravis without (acute) exacerbation: Secondary | ICD-10-CM

## 2019-10-26 DIAGNOSIS — J9600 Acute respiratory failure, unspecified whether with hypoxia or hypercapnia: Secondary | ICD-10-CM

## 2019-10-26 LAB — CBC
HCT: 42.4 % (ref 36.0–46.0)
Hemoglobin: 13.8 g/dL (ref 12.0–15.0)
MCH: 31.3 pg (ref 26.0–34.0)
MCHC: 32.5 g/dL (ref 30.0–36.0)
MCV: 96.1 fL (ref 80.0–100.0)
Platelets: 178 10*3/uL (ref 150–400)
RBC: 4.41 MIL/uL (ref 3.87–5.11)
RDW: 12.8 % (ref 11.5–15.5)
WBC: 5.7 10*3/uL (ref 4.0–10.5)
nRBC: 0 % (ref 0.0–0.2)

## 2019-10-26 LAB — BASIC METABOLIC PANEL
Anion gap: 11 (ref 5–15)
BUN: 8 mg/dL (ref 8–23)
CO2: 21 mmol/L — ABNORMAL LOW (ref 22–32)
Calcium: 9 mg/dL (ref 8.9–10.3)
Chloride: 101 mmol/L (ref 98–111)
Creatinine, Ser: 0.7 mg/dL (ref 0.44–1.00)
GFR calc Af Amer: >60 mL/min (ref >60)
GFR calc non Af Amer: >60 mL/min (ref >60)
Glucose, Bld: 145 mg/dL — ABNORMAL HIGH (ref 70–99)
Potassium: 3.6 mmol/L (ref 3.5–5.1)
Sodium: 133 mmol/L — ABNORMAL LOW (ref 135–145)

## 2019-10-26 LAB — GLUCOSE, CAPILLARY
Glucose-Capillary: 101 mg/dL — ABNORMAL HIGH (ref 70–99)
Glucose-Capillary: 112 mg/dL — ABNORMAL HIGH (ref 70–99)
Glucose-Capillary: 117 mg/dL — ABNORMAL HIGH (ref 70–99)
Glucose-Capillary: 126 mg/dL — ABNORMAL HIGH (ref 70–99)
Glucose-Capillary: 132 mg/dL — ABNORMAL HIGH (ref 70–99)

## 2019-10-26 LAB — MAGNESIUM: Magnesium: 1.8 mg/dL (ref 1.7–2.4)

## 2019-10-26 MED ORDER — FENTANYL CITRATE (PF) 100 MCG/2ML IJ SOLN
25.0000 ug | INTRAMUSCULAR | Status: DC | PRN
  Administered 2019-10-26 (×2): 50 ug via INTRAVENOUS
  Administered 2019-10-27 (×3): 25 ug via INTRAVENOUS
  Administered 2019-10-27 (×2): 50 ug via INTRAVENOUS
  Administered 2019-10-27: 11:00:00 25 ug via INTRAVENOUS
  Administered 2019-10-28: 10:00:00 50 ug via INTRAVENOUS
  Filled 2019-10-26 (×8): qty 2

## 2019-10-26 MED ORDER — FENTANYL CITRATE (PF) 100 MCG/2ML IJ SOLN
INTRAMUSCULAR | Status: AC
  Filled 2019-10-26: qty 2

## 2019-10-26 MED ORDER — METOPROLOL TARTRATE 5 MG/5ML IV SOLN
2.5000 mg | INTRAVENOUS | Status: DC | PRN
  Administered 2019-10-26 – 2019-10-29 (×4): 5 mg via INTRAVENOUS
  Filled 2019-10-26 (×4): qty 5

## 2019-10-26 MED ORDER — DEXAMETHASONE SODIUM PHOSPHATE 10 MG/ML IJ SOLN
6.0000 mg | Freq: Three times a day (TID) | INTRAMUSCULAR | Status: DC
  Administered 2019-10-26 – 2019-10-28 (×5): 6 mg via INTRAVENOUS
  Filled 2019-10-26 (×5): qty 1

## 2019-10-26 MED ORDER — DEXAMETHASONE SODIUM PHOSPHATE 10 MG/ML IJ SOLN
6.0000 mg | Freq: Once | INTRAMUSCULAR | Status: AC
  Administered 2019-10-26: 6 mg via INTRAVENOUS

## 2019-10-26 MED ORDER — SODIUM CHLORIDE 0.9 % IV SOLN
INTRAVENOUS | Status: DC | PRN
  Administered 2019-10-26: 500 mL via INTRAVENOUS

## 2019-10-26 MED ORDER — SODIUM CHLORIDE 0.9 % IV SOLN
1.0000 g | INTRAVENOUS | Status: AC
  Administered 2019-10-26 – 2019-10-28 (×3): 1 g via INTRAVENOUS
  Filled 2019-10-26 (×3): qty 1

## 2019-10-26 MED ORDER — DEXAMETHASONE SODIUM PHOSPHATE 10 MG/ML IJ SOLN
INTRAMUSCULAR | Status: AC
  Filled 2019-10-26: qty 1

## 2019-10-26 NOTE — Progress Notes (Signed)
Pt able to perform NIF -10 and VC of 600 ml. Pt had good effort. Bipap on standby at this time. Pt resting comfortably at this time, will place pt on bipap if needed throughout the night.

## 2019-10-26 NOTE — Progress Notes (Signed)
SLP Cancellation Note  Patient Details Name: Marjory Klimas MRN: [id number] DOB: [birth year]   Cancelled treatment:       Reason Eval/Treat Not Completed: Medical issues which prohibited therapy; pt on BiPAP. Will follow for readiness for PO intake.  Jabarie Pop L. Tivis Ringer, [place] CCC/SLP Acute Rehabilitation Services Office number [phone] Secure chat for communication    Juan Quam Laurice 10/26/2019, 9:30 AM

## 2019-10-26 NOTE — Progress Notes (Signed)
NAME:  Elaine Owens, MRN:  [id number], DOB:  Sep 23, 1925, LOS: 3 ADMISSION DATE:  10/23/2019, CONSULTATION DATE: 10/23/2019 REFERRING MD: Leonel Ramsay, CHIEF COMPLAINT: Slurred speech  Brief History   Increasing slurred speech difficulty swallowing for 1 week presumably secondary to myasthenia gravis exacerbation  History of present illness   Patient is a 86 year old with a known history of myasthenia gravis who has been having increased difficulty with slurred speech and saliva management over the last week.  It is unclear as to whether or not she has been taking her Mestinon or not in her database is not complete.  It is unclear as to her outpatient follow-up since her previous similar admission in November 2020. Evaluation the patient is very difficult to understand speech.  She is able to stick her tongue fully out her mouth.  Her peripheral grip strength is currently good.  She does appear to have some shallow respiratory effort but is not having paroxysmal abdominal movements.  She is also able to speak although unintelligibly in what seems like complete sentences.  Serial NIF in the emergency room have been around -10  Past Medical History   . Arthritis   . Complication of anesthesia    hard to wake up  . Hypertension   . Immune deficiency disorder ([place])   . lt breast ca dx'd 1978 or 1988 pt unsure   lt masectomy; tamoxifen completed  . Myasthenia gravis ([place])      Significant Hospital Events   2/5: Admission to [facility] 10/23/2019 patient is a DNR although on my discussion with her she agrees to noninvasive BiPAP ventilation.  Started on mestinon, started IVIG at 2am (2/6) 2/6: Today is alert and able to answer simple questions.  Breathing well on my gross evaluation.   2/7: Nif still low per RT report, still not passing swallow.  Awake and alert, pleasant  Consults:  PCCM  Procedures:   Significant Diagnostic Tests:  NA  Micro Data:  2/5 SARS2/ flu A/B  >> neg 2/5 MRSA PCR >> 2/8 UC >>  Antimicrobials:  2/8 ceftriaxone >>  Interim history/subjective:  Refused BiPAP overnight  Developed acute respiratory distress with hypoxia and stridor this am with new Afib w/RVR- agreed to be placed back on BiPAP  Objective   Blood pressure (!) 156/65, pulse (!) 189, temperature 97.6 F (36.4 C), temperature source Axillary, resp. rate 17, weight 51.6 kg, SpO2 98 %.    Vent Mode: BIPAP FiO2 (%):  [40 %] 40 % Set Rate:  [10 bmp] 10 bmp PEEP:  [5 cmH20] 5 cmH20   Intake/Output Summary (Last 24 hours) at 10/26/2019 [id number] Last data filed at 10/26/2019 0800 Gross per 24 hour  Intake 1510.13 ml  Output 1200 ml  Net 310.13 ml   Filed Weights   10/24/19 0043 10/24/19 0500 10/25/19 0423  Weight: 52 kg 51.9 kg 51.6 kg    Examination: General:  Elderly female in severe distress HEENT: MM pink/moist, orally suctioned with no significant oral secretions, minimal gag, +stridor Neuro: Awake, f/c, nods appropriately  CV: irir, Afib w/RVR PULM:  Diminished throughout, respiratory distress/ tripod GI: soft, bs+  Extremities: warm/dry, no LE edema  Skin: no rashes    Resolved Hospital Problem list     Assessment & Plan:  Acute Myasthenia gravis exacerbation P:  IVIG started 2/6 for 5 days, not likely to see improvement for 4-5 days post infusion  Mestinon started 2/5 Per Neurology    Acute hypoxic respiratory distress-  related to above Stridor 2/8 High risk for aspiration  Dysphagia  P:  DNI/ DNR BiPAP now- patient agreeable to  Decadron 6mg  now then q 8hr Fentanyl prn respiratory distress  Family called to bedside in event patient does not improve, will need to transition to full comfort care No racemic epi given Afib  Trending NIF (have been ~10)/ FVC (have been ~500 ml) SLP following- moderate aspiration risk  NPO, remains on D5/0.45NS at 50 ml/hr   Hypertension P:  Change to lopressor / hydralazine prn    PAF- new, likely due  to stress, hypoxia, respiratory distress P:  Supportive treatment with BiPAP/ O2 has helped Consider lopressor prn if needed  Check BMET/ mag    UTI - remains afebrile/ normal WBC P:  Ceftriaxone x3 days  Send UC  Check CBC   Best practice:  Diet: NPO Pain/Anxiety/Delirium protocol (if indicated): N/A VAP protocol (if indicated): N/A DVT prophylaxis: Lovenox GI prophylaxis: protonix Glucose control: NA Mobility: Bedrest Code Status: DNI/ DNR Family Communication: updated daughters Lisa/ Samantha by phone Disposition: ICU   Labs   CBC: Recent Labs  Lab 10/23/19 1853 10/23/19 1906 10/23/19 2108 10/25/19 0622  WBC 5.4  --  4.9  --   NEUTROABS 2.9  --   --   --   HGB 14.3 14.6 14.1 11.9*  HCT 43.9 43.0 43.6 35.0*  MCV 96.7  --  96.7  --   PLT 224  --  198  --     Basic Metabolic Panel: Recent Labs  Lab 10/23/19 1853 10/23/19 1906 10/24/19 0346 10/25/19 0622  NA 140 139 141 136  K 3.7 3.5 3.9 3.8  CL 103 106 105  --   CO2 24  --  25  --   GLUCOSE 106* 98 106*  --   BUN 11 12 9   --   CREATININE 0.73 0.70 0.63  --   CALCIUM 10.0  --  9.3  --    GFR: Estimated Creatinine Clearance: 34.6 mL/min (by C-G formula based on SCr of 0.63 mg/dL). Recent Labs  Lab 10/23/19 1853 10/23/19 2108  WBC 5.4 4.9    Liver Function Tests: Recent Labs  Lab 10/23/19 1853  AST 35  ALT 33  ALKPHOS 46  BILITOT 0.8  PROT 7.3  ALBUMIN 4.3   No results for input(s): LIPASE, AMYLASE in the last 168 hours. No results for input(s): AMMONIA in the last 168 hours.  ABG    Component Value Date/Time   PHART 7.397 10/25/2019 0622   PCO2ART 39.2 10/25/2019 0622   PO2ART 73.0 (L) 10/25/2019 0622   HCO3 24.1 10/25/2019 0622   TCO2 25 10/25/2019 0622   ACIDBASEDEF 1.0 10/25/2019 0622   O2SAT 95.0 10/25/2019 0622     Coagulation Profile: Recent Labs  Lab 10/23/19 1853  INR 1.1    Cardiac Enzymes: No results for input(s): CKTOTAL, CKMB, CKMBINDEX, TROPONINI in the  last 168 hours.  HbA1C: No results found for: HGBA1C  CBG: Recent Labs  Lab 10/26/19 0000 10/26/19 0520  GLUCAP 112* 101*    CCT 30 mins   Kennieth Rad, MSN, AGACNP-BC [facility] 10/26/2019, 9:22 AM

## 2019-10-26 NOTE — Progress Notes (Signed)
Upon arrival to patient room to do AM NIF/VC patient noted to be in respiratory distress with audible stridor.  NP was present in patient room.  Patient was on NRB mask.  Per NP, patient placed on bipap and is currently tolerating well.  HR elevated to 190 when MD came to patient room.  Instructed RN to give medications.  Patient working well with bipap at this time.  Work of breathing eased once placed on bipap.  Will continue to monitor.

## 2019-10-26 NOTE — Progress Notes (Signed)
0810:  Patient wanted to have bowel movement, RN help patient on bed pan.  Patient started labor breathing and increase respiratory effort with stridors.  Oxygen saturations drop, heart rate increase to [id number] and systolic blood pressures in the 190s.   RN put patient on non breather. RN paged critical care team to assess patient.  New orders received, see MAR.  Patient agreeable to BIPAP.  RT put patient on BIPAP.  Critical care medicine updated patient's daughter.  RN to continue to monitor.

## 2019-10-26 NOTE — Progress Notes (Signed)
NEUROLOGY PROGRESS NOTE  Brief history over night: On arrival last night noted to be in respiratory distress and audible strider. O2 saturation dropped in to the 30's, CPAP placed, given 50 mcg of fentanyl and 6 mg decadron to be continued at 6 mg every 8 hours. She is a DNR. Due to respiratory distress went into brief period of Afib but now in NSR but tachycardic.  Subjective: Patient now on CPAP, shaking head she is uncomfotable and does not like the mask. Family in room stating she continues to take off mask.   Exam: Vitals:   10/26/19 0815 10/26/19 0821  BP: (!) 156/65   Pulse: 91 (!) 189  Resp: (!) 22 17  Temp:    SpO2: 99% 98%    Physical Exam  Constitutional: Appears well-developed and well-nourished.  Eyes: No scleral injection HENT: No OP obstrucion Head: Normocephalic.  Cardiovascular: Normal rate and regular rhythm.  Respiratory: on CPAP with labored breathing GI: Soft.  No distension. There is no tenderness.  Skin: WDI   Neuro:  Mental Status: Alert, able to follow commands and use thumbs up and down to answer questions Cranial Nerves: II:  Visual fields grossly normal,  III,IV, VI: ptosis not present, able to look vertically for 10 seconds wit no ptosis, extra-ocular motions intact bilaterally pupils equal, round, reactive to light and accommodation V,VII: face symmetric- difficult to assess due to CPAP VIII: hearing normal bilaterally Motor: Moving all extremities antigravity Plantars: Right: downgoing   Left: downgoing  --is able to take a good deep breath  Medications:  Scheduled: . Chlorhexidine Gluconate Cloth  6 each Topical Daily  . dexamethasone (DECADRON) injection  6 mg Intravenous Q8H  . enoxaparin (LOVENOX) injection  40 mg Subcutaneous Q24H  . mouth rinse  15 mL Mouth Rinse BID  . pantoprazole (PROTONIX) IV  40 mg Intravenous Q24H  . pyridostigmine  2 mg Intravenous Q6H   Continuous: . dextrose 5 % and 0.45% NaCl    . Immune Globulin  10% 0 g (10/24/19 2350)   [id number] **OR** acetaminophen, fentaNYL (SUBLIMAZE) injection, hydrALAZINE  Pertinent Labs/Diagnostics: UA- hazy, moderate Leukocytes, rare bacteria  Etta Quill PA-C [facility] [phone]  Assessment:  Myasthenia exacerbation with significant respiratory decompensation. Now having worsening respiratory status resulting in BIPAP. Continues to be on Mestinon 6 mg IV, IVIG with today being day 3. Due to respiratory distress last night added 6 mg Decadron IV Q 8 hours. Patient continues to be clear she does not want to be intubated.    Impression: Myasthenia exacerbation UTI/asymptomatic bacteriuria  Plan: - ABX per PCCM -respiratory care BIPAP, HR and BP per PCCM - continue mestinon at 2 mg IV Q6 - continue Decadron 6mg  IV Q 6 - Continue IVIG for 5 days --today is day 3 -CCM start patient on ceftriaxone for UTI, urine cultures pending -discussed with family to continue IVIG and will watch her status.    10/26/2019, 9:27 AM   NEUROHOSPITALIST ADDENDUM Performed a face to face diagnostic evaluation.   I have reviewed the contents of history and physical exam as documented by PA/ARNP/Resident and agree with above documentation.  I have discussed and formulated the above plan as documented. Edits to the note have been made as needed.   86 year old female with myasthenia gravis exacerbation, now received IVIG into 3.  Patient had decompensated from respiratory standpoint last night, has improved this morning with BiPAP.  She is currently following commands, appears to understand what is going on.  No  significant ptosis, able to sustain vertical gaze for 15 seconds.  PCCM started patient on Decadron 6 mg IV every 6 hours, patient also on Mestinon 2 mg IV every 6 hours.   Discussed with CCM and patient's family that improvement of myasthenia likely to occur after few days after completion of 5-day IVIG course.  Patient states she will  continue to tolerate being on BiPAP, refuses intubation.  Family understand that if patient decompensates despite current treatment of IVIG, steroids, Mestinon and BiPAP to transition to comfort care.   CRITICAL CARE Performed by: Lanice Schwab Shaunna Rosetti   Total critical care time: 35  minutes  Critical care time was exclusive of separately billable procedures and treating other patients.  Critical care was necessary to treat or prevent imminent or life-threatening deterioration.  Critical care was time spent personally by me on the following activities: development of treatment plan with patient and/or surrogate as well as nursing, discussions with consultants, evaluation of patient's response to treatment, examination of patient, obtaining history from patient or surrogate, ordering and performing treatments and interventions, ordering and review of laboratory studies, ordering and review of radiographic studies, pulse oximetry and re-evaluation of patient's condition.    Karena Addison Jasira Robinson MD [facility] [id number]   If 7pm to 7am, please call on call as listed on AMION.

## 2019-10-27 DIAGNOSIS — R0603 Acute respiratory distress: Secondary | ICD-10-CM

## 2019-10-27 LAB — GLUCOSE, CAPILLARY
Glucose-Capillary: 124 mg/dL — ABNORMAL HIGH (ref 70–99)
Glucose-Capillary: 127 mg/dL — ABNORMAL HIGH (ref 70–99)
Glucose-Capillary: 129 mg/dL — ABNORMAL HIGH (ref 70–99)
Glucose-Capillary: 151 mg/dL — ABNORMAL HIGH (ref 70–99)
Glucose-Capillary: 153 mg/dL — ABNORMAL HIGH (ref 70–99)

## 2019-10-27 LAB — URINE CULTURE: Culture: NO GROWTH

## 2019-10-27 MED ORDER — ATROPINE SULFATE 1 MG/10ML IJ SOSY
PREFILLED_SYRINGE | INTRAMUSCULAR | Status: AC
  Filled 2019-10-27: qty 10

## 2019-10-27 MED ORDER — MAGNESIUM SULFATE 2 GM/50ML IV SOLN
2.0000 g | Freq: Once | INTRAVENOUS | Status: AC
  Administered 2019-10-27: 11:00:00 2 g via INTRAVENOUS
  Filled 2019-10-27: qty 50

## 2019-10-27 MED ORDER — FENTANYL CITRATE (PF) 100 MCG/2ML IJ SOLN
INTRAMUSCULAR | Status: AC
  Administered 2019-10-28: 16:00:00 25 ug via INTRAVENOUS
  Filled 2019-10-27: qty 2

## 2019-10-27 MED ORDER — LORAZEPAM 2 MG/ML IJ SOLN
0.5000 mg | INTRAMUSCULAR | Status: DC | PRN
  Administered 2019-10-27 – 2019-10-29 (×3): 1 mg via INTRAVENOUS
  Filled 2019-10-27 (×3): qty 1

## 2019-10-27 MED ORDER — POTASSIUM CHLORIDE 10 MEQ/100ML IV SOLN
10.0000 meq | INTRAVENOUS | Status: AC
  Administered 2019-10-27 (×2): 10 meq via INTRAVENOUS
  Filled 2019-10-27 (×3): qty 100

## 2019-10-27 MED ORDER — LORAZEPAM 2 MG/ML IJ SOLN
0.5000 mg | Freq: Once | INTRAMUSCULAR | Status: AC
  Administered 2019-10-27: 17:00:00 0.5 mg via INTRAVENOUS

## 2019-10-27 MED ORDER — LORAZEPAM 2 MG/ML IJ SOLN
INTRAMUSCULAR | Status: AC
  Filled 2019-10-27: qty 1

## 2019-10-27 NOTE — Progress Notes (Signed)
  Speech Language Pathology Treatment: Dysphagia  Patient Details Name: Elaine Owens MRN: [id number] DOB: 13-Jun-1926 Today's Date: 10/27/2019 Time: [id number] SLP Time Calculation (min) (ACUTE ONLY): 25 min  Assessment / Plan / Recommendation Clinical Impression  Pt continues to present with generalized oral weakness. Pt was observed with ice chips. Following each trial, pt noted with prolonged bolus formation and transit, oral pooling and multiple swallows. Pt required Mod cues from clinician in order to initiate swallow. No overt s/sx of aspiration observed, but per conversation with PCCM, pt should proceed conservatively. Recommend continued NPO with 1-3 ice chips following oral care.    HPI HPI: Elaine Owens is a 86 y.o. female with a history of myasthenia gravis. She was admitted due to trouble swallowing. Chest xray dated 10/23/19 was showing no active disease. PMH includes breast cancer and trouble swallowing. BSE in November 2020 was [facility].      SLP Plan  Continue with current plan of care       Recommendations  Diet recommendations: NPO Medication Administration: Via alternative means Supervision: Full supervision/cueing for compensatory strategies                Oral Care Recommendations: Staff/trained caregiver to provide oral care;Oral care prior to ice chip/H20 Follow up Recommendations: Other (comment)(tbd) SLP Visit Diagnosis: Dysphagia, oropharyngeal phase (R13.12) Plan: Continue with current plan of care       [place], Student SLP Office: [phone]  10/27/2019, 9:27 AM

## 2019-10-27 NOTE — Progress Notes (Signed)
RT note: NIF: -12  VC: 0.74 L  Good patient effort.   Patient removed from BiPAP at this time. Placed patient on 3 Lpm nasal cannula. Patient resting comfortably. Vitals stable. Will continue to monitor.

## 2019-10-27 NOTE — Progress Notes (Signed)
NIF -10, VC 1.7L with good patient effort.

## 2019-10-27 NOTE — Progress Notes (Addendum)
NAME:  Elaine Owens, MRN:  [id number], DOB:  [birth year], LOS: 4 ADMISSION DATE:  10/23/2019, CONSULTATION DATE: 10/23/2019 REFERRING MD: Leonel Ramsay, CHIEF COMPLAINT: Slurred speech  Brief History   Increasing slurred speech difficulty swallowing for 1 week presumably secondary to myasthenia gravis exacerbation  History of present illness   Patient is a 86 year old with a known history of myasthenia gravis who has been having increased difficulty with slurred speech and saliva management over the last week.  It is unclear as to whether or not she has been taking her Mestinon or not in her database is not complete.  It is unclear as to her outpatient follow-up since her previous similar admission in November 2020. Evaluation the patient is very difficult to understand speech.  She is able to stick her tongue fully out her mouth.  Her peripheral grip strength is currently good.  She does appear to have some shallow respiratory effort but is not having paroxysmal abdominal movements.  She is also able to speak although unintelligibly in what seems like complete sentences.  Serial NIF in the emergency room have been around -10  Past Medical History   . Arthritis   . Complication of anesthesia    hard to wake up  . Hypertension   . Immune deficiency disorder ([place])   . lt breast ca dx'd 1978 or 1988 pt unsure   lt masectomy; tamoxifen completed  . Myasthenia gravis ([place])      Significant Hospital Events   2/5: Admission to [facility] 10/23/2019 patient is a DNR although on my discussion with her she agrees to noninvasive BiPAP ventilation.  Started on mestinon, started IVIG at 2am (2/6) 2/6: Today is alert and able to answer simple questions.  Breathing well on my gross evaluation.   2/7: Nif still low per RT report, still not passing swallow.  Awake and alert, pleasant 2/8 had respiratory distress/ stridor in am, started on decadron and agreed to BiPAP; NIF -10, VC 600  ml  Consults:  PCCM  Procedures:   Significant Diagnostic Tests:  NA  Micro Data:  2/5 SARS2/ flu A/B >> neg 2/5 MRSA PCR >> 2/8 UC >>  Antimicrobials:  2/8 ceftriaxone >>  Interim history/subjective:  Has done well, remained off BiPAP since yesterday  Worked with SLP this am - remains NPO NIF -10, VC 1.7L (better today) with good effort this am  Remains afebrile  However tired out, required going back on BiPAP ~0930, saying she couldn't breathe, desaturated.    Objective   Blood pressure (!) 158/62, pulse 71, temperature 98.5 F (36.9 C), temperature source Axillary, resp. rate (!) 26, weight 52.2 kg, SpO2 100 %.    Vent Mode: BIPAP FiO2 (%):  [40 %] 40 % Set Rate:  [10 bmp] 10 bmp PEEP:  [5 cmH20] 5 cmH20   Intake/Output Summary (Last 24 hours) at 10/27/2019 0943 Last data filed at 10/27/2019 0800 Gross per 24 hour  Intake 675.68 ml  Output 1000 ml  Net -324.32 ml   Filed Weights   10/24/19 0500 10/25/19 0423 10/27/19 0500  Weight: 51.9 kg 51.6 kg 52.2 kg   Examination: General:  Elderly female sitting upright in bed in mild distress on BiPAP  HEENT: full face BiPAP mask Neuro: Awake, follows commands- limited while on BiPAP CV: irir PULM:  Diminished/ Diffuse insp wheeze and inspiratory stridor, getting ~400 TV on 12/5 GI: soft, bs active, NT Extremities: warm/dry, no LE edema  Skin: no rashes  Resolved Hospital Problem list     Assessment & Plan:  Acute Myasthenia gravis exacerbation P:  IVIG started 2/6 for 5 days, not likely to see improvement for 4-5 days post infusion per Neuro Mestinon started 2/5 Per Neurology  Wants to have on prednisone 20 mg daily- but NPO and currently on 48 hour of decadron for stridor- so will need to change to solumedrol 50 mg daily 2/10 till taking POs    Acute hypoxic respiratory distress- related to above Stridor 2/8, again noted today 2/9 High risk for aspiration  Dysphagia  P:  DNI/ DNR BiPAP as needed   Decadron 6mg  q 8hr x 48 hrs (started 2/8 am) Fentanyl prn respiratory distress  Trending NIFs/ FVC  SLP following If she further decompensates, will need to transition to full comfort care  Patient/ family has not wanted short/ longterm feeding tube  Ok with PCCM to be monitored in PCU, defer to primary    Hypertension P:  lopressor / hydralazine IV prn while NPO   PAF- new, likely due to stress, hypoxia, respiratory distress P:  Supportive treatment with BiPAP/ O2  lopressor prn for rate control   Goal > 4; Mag > 2    UTI P:  Ceftriaxone x3 days  Follow UC  Trend WBC/ fever curve   Best practice:  Diet: NPO Pain/Anxiety/Delirium protocol (if indicated): N/A VAP protocol (if indicated): N/A DVT prophylaxis: Lovenox GI prophylaxis: protonix Glucose control: NA Mobility: Bedrest Code Status: DNI/ DNR Family Communication: updated family at bedside yesterday, Will update on visitation.  Disposition: ICU   Labs   CBC: Recent Labs  Lab 10/23/19 1853 10/23/19 1906 10/23/19 2108 10/25/19 0622 10/26/19 1203  WBC 5.4  --  4.9  --  5.7  NEUTROABS 2.9  --   --   --   --   HGB 14.3 14.6 14.1 11.9* 13.8  HCT 43.9 43.0 43.6 35.0* 42.4  MCV 96.7  --  96.7  --  96.1  PLT 224  --  198  --  0000000    Basic Metabolic Panel: Recent Labs  Lab 10/23/19 1853 10/23/19 1906 10/24/19 0346 10/25/19 0622 10/26/19 1203  NA 140 139 141 136 133*  K 3.7 3.5 3.9 3.8 3.6  CL 103 106 105  --  101  CO2 24  --  25  --  21*  GLUCOSE 106* 98 106*  --  145*  BUN 11 12 9   --  8  CREATININE 0.73 0.70 0.63  --  0.70  CALCIUM 10.0  --  9.3  --  9.0  MG  --   --   --   --  1.8   GFR: Estimated Creatinine Clearance: 34.6 mL/min (by C-G formula based on SCr of 0.7 mg/dL). Recent Labs  Lab 10/23/19 1853 10/23/19 2108 10/26/19 1203  WBC 5.4 4.9 5.7    Liver Function Tests: Recent Labs  Lab 10/23/19 1853  AST 35  ALT 33  ALKPHOS 46  BILITOT 0.8  PROT 7.3  ALBUMIN 4.3   No  results for input(s): LIPASE, AMYLASE in the last 168 hours. No results for input(s): AMMONIA in the last 168 hours.  ABG    Component Value Date/Time   PHART 7.397 10/25/2019 0622   PCO2ART 39.2 10/25/2019 0622   PO2ART 73.0 (L) 10/25/2019 0622   HCO3 24.1 10/25/2019 0622   TCO2 25 10/25/2019 0622   ACIDBASEDEF 1.0 10/25/2019 0622   O2SAT 95.0 10/25/2019 0622  Coagulation Profile: Recent Labs  Lab 10/23/19 1853  INR 1.1    Cardiac Enzymes: No results for input(s): CKTOTAL, CKMB, CKMBINDEX, TROPONINI in the last 168 hours.  HbA1C: No results found for: HGBA1C  CBG: Recent Labs  Lab 10/26/19 1224 10/26/19 2017 10/26/19 2350 10/27/19 0349 10/27/19 0832  GLUCAP 126* 132* 117* [place], MSN, AGACNP-BC [facility] 10/27/2019, 9:43 AM

## 2019-10-27 NOTE — Progress Notes (Signed)
RN respond to call bell and patient's family stated patient can not breathe and would like to try BIPAP.  Patient agree to try BIPAP.  While switching patient over from nasal cannula to BIPAP, patient's heart rate drop down to 20s.  Critical Care Medicine on floor and came to assess patient.  Verbal order, see MAR.  Family updated.  RN to continue to monitor.

## 2019-10-27 NOTE — Progress Notes (Signed)
Patient stated she was having trouble breathing.  BIPAP placed on patient.  PRN given.  CCM notified and assessed patient.

## 2019-10-27 NOTE — Progress Notes (Signed)
Checked on patient, daughter at bedside.  Patient has not had a good day, barely rested.  Has been intermittent on BiPAP throughout the day, going back on when she complains she cant breathe.   Daughter tearful, hates to see her mother like this- uncomfortable, pulling at [place], saying she cant breathe.  We discussed adding ativan, starting with small doses.  We did discuss that her airway is tenuous and ativan may decompensate her airway, but she wants her mother to be more comfortable.  Not ready to go comfort care but will try and given small doses to help patient become more comfortable.  We gave 0.5mg  ativan with no effect.  Patient denies pain but still pulls at bipap saying she still cant breathe.   P:  Will give another 0.5-1 mg q 3hr for ongoing agitation PRN fentanyl if needed for pain     Kennieth Rad, MSN, AGACNP-BC [facility] 10/27/2019, 6:10 PM

## 2019-10-27 NOTE — Progress Notes (Signed)
Reason for consult: Myasthenia gravis exacerbation  Subjective: Patient has improved today and is off BiPAP.  Forced vital capacity 600.  On examination single breath count around 15.  No ptosis on exam.  No significant dysarthria.   ROS: negative except above  Examination  Vital signs in last 24 hours: Temp:  [97.9 F (36.6 C)-99.7 F (37.6 C)] 98.5 F (36.9 C) (02/09 0800) Pulse Rate:  [60-118] 71 (02/09 0800) Resp:  [12-36] 26 (02/09 0800) BP: (108-162)/(51-78) 158/62 (02/09 0800) SpO2:  [97 %-100 %] 100 % (02/09 0800) FiO2 (%):  [40 %] 40 % (02/08 1117) Weight:  [52.2 kg] 52.2 kg (02/09 0500)  General: lying in bed CVS: pulse-normal rate and rhythm RS: breathing comfortably Extremities: normal   Neuro: MS: Alert, oriented, follows commands CN: pupils equal and reactive, no ptosis, EOMI, face symmetric, tongue midline, normal sensation over face, Motor: 5/5 strength in all 4 extremities Reflexes: 2+ bilaterally over patella, biceps, plantars: flexor Coordination: normal Gait: not tested  Basic Metabolic Panel: Recent Labs  Lab 10/23/19 1853 10/23/19 1906 10/24/19 0346 10/25/19 0622 10/26/19 1203  NA 140 139 141 136 133*  K 3.7 3.5 3.9 3.8 3.6  CL 103 106 105  --  101  CO2 24  --  25  --  21*  GLUCOSE 106* 98 106*  --  145*  BUN 11 12 9   --  8  CREATININE 0.73 0.70 0.63  --  0.70  CALCIUM 10.0  --  9.3  --  9.0  MG  --   --   --   --  1.8    CBC: Recent Labs  Lab 10/23/19 1853 10/23/19 1906 10/23/19 2108 10/25/19 0622 10/26/19 1203  WBC 5.4  --  4.9  --  5.7  NEUTROABS 2.9  --   --   --   --   HGB 14.3 14.6 14.1 11.9* 13.8  HCT 43.9 43.0 43.6 35.0* 42.4  MCV 96.7  --  96.7  --  96.1  PLT 224  --  198  --  178     Coagulation Studies: No results for input(s): LABPROT, INR in the last 72 hours.  Imaging Reviewed:     ASSESSMENT AND PLAN  Myasthenia gravis exacerbation Urinary tract infection  Plan -Continue IVIG for total of 5 days,  day 4/5 -Speech evaluation for swallowing, if patient fails consider core track -Respiratory care, currently off BiPAP, appreciate CCM assistance -Continue Decadron per CCM, can eventually be switched to prednisone 20 mg daily -Continue Mestinon at 2 mg IV every 6, can eventually switch to 60 mg p.o. every 6   Caymen Dubray [facility] Pager Number [phone] For questions after 7pm please refer to AMION to reach the Neurologist on call

## 2019-10-28 LAB — BASIC METABOLIC PANEL
Anion gap: 11 (ref 5–15)
BUN: 17 mg/dL (ref 8–23)
CO2: 22 mmol/L (ref 22–32)
Calcium: 8.9 mg/dL (ref 8.9–10.3)
Chloride: 103 mmol/L (ref 98–111)
Creatinine, Ser: 0.66 mg/dL (ref 0.44–1.00)
GFR calc Af Amer: >60 mL/min (ref >60)
GFR calc non Af Amer: >60 mL/min (ref >60)
Glucose, Bld: 141 mg/dL — ABNORMAL HIGH (ref 70–99)
Potassium: 4 mmol/L (ref 3.5–5.1)
Sodium: 136 mmol/L (ref 135–145)

## 2019-10-28 LAB — GLUCOSE, CAPILLARY
Glucose-Capillary: 153 mg/dL — ABNORMAL HIGH (ref 70–99)
Glucose-Capillary: 159 mg/dL — ABNORMAL HIGH (ref 70–99)

## 2019-10-28 MED ORDER — DEXAMETHASONE SODIUM PHOSPHATE 4 MG/ML IJ SOLN: 4.0000 mg | Freq: Two times a day (BID) | INTRAMUSCULAR | Status: DC

## 2019-10-28 MED ORDER — METHYLPREDNISOLONE SODIUM SUCC 125 MG IJ SOLR
50.0000 mg | Freq: Once | INTRAMUSCULAR | Status: DC
  Filled 2019-10-28: qty 2

## 2019-10-28 MED ORDER — DEXAMETHASONE SODIUM PHOSPHATE 4 MG/ML IJ SOLN
4.0000 mg | Freq: Four times a day (QID) | INTRAMUSCULAR | Status: DC
  Administered 2019-10-28 – 2019-10-29 (×3): 4 mg via INTRAVENOUS
  Filled 2019-10-28 (×3): qty 1

## 2019-10-28 NOTE — Progress Notes (Signed)
REMAINS ON [place] AT 3 WITH NO OBVIOUS DISTRESS. REMOVED FROM BIPAP ~2PM. DAUGHTER AT BEDSIDE. EXPLAINED TO FAMILY THAT AS LONG AS AIRWAYS REMAINS PATENT AND NO OBVIOUS SIGNS OF APNEA/DYSPNEA APPEAR WILL LEAVE ON [place] RESP THERAPY PERMITTING.

## 2019-10-28 NOTE — Progress Notes (Signed)
NAME:  Elaine Owens, MRN:  [id number], DOB:  Jul 22, 1926, LOS: 5 ADMISSION DATE:  10/23/2019, CONSULTATION DATE: 10/23/2019 REFERRING MD: Leonel Ramsay, CHIEF COMPLAINT: Slurred speech  Brief History   Increasing slurred speech difficulty swallowing for 1 week presumably secondary to myasthenia gravis exacerbation  History of present illness   Patient is a 86 year old with a known history of myasthenia gravis who has been having increased difficulty with slurred speech and saliva management over the last week.  It is unclear as to whether or not she has been taking her Mestinon or not in her database is not complete.  It is unclear as to her outpatient follow-up since her previous similar admission in November 2020. Evaluation the patient is very difficult to understand speech.  She is able to stick her tongue fully out her mouth.  Her peripheral grip strength is currently good.  She does appear to have some shallow respiratory effort but is not having paroxysmal abdominal movements.  She is also able to speak although unintelligibly in what seems like complete sentences.  Serial NIF in the emergency room have been around -10  Past Medical History   has a past medical history of Arthritis, Complication of anesthesia, Hypertension, Immune deficiency disorder ([place]), lt breast ca (dx'd 1978 or 1988 pt unsure), and Myasthenia gravis ([place]).   Significant Hospital Events   2/5: Admission to [facility] 10/23/2019 patient is a DNR although on my discussion with her she agrees to noninvasive BiPAP ventilation.  Started on mestinon, started IVIG at 2am (2/6) 2/6: Today is alert and able to answer simple questions.  Breathing well on my gross evaluation.   2/7: Nif still low per RT report, still not passing swallow.  Awake and alert, pleasant 2/8 had respiratory distress/ stridor in am, started on decadron and agreed to BiPAP; NIF -10, VC 600 ml 2/9 stayed off BiPAP most of the night, but back on  after acute distress this morning.   Consults:  PCCM  Procedures:   Significant Diagnostic Tests:  NA  Micro Data:  2/5 SARS2/ flu A/B >> neg 2/5 MRSA PCR >> 2/8 UC >>  Antimicrobials:  2/8 ceftriaxone >>  Interim history/subjective:  Off bipap most of the night, now back on after acute distress. More somnolent this morning.    NIF -12 last night, VC 0.74L  with good effort this am. Still waiting on this morning.   Objective   Blood pressure (!) 106/50, pulse 68, temperature 97.6 F (36.4 C), temperature source Axillary, resp. rate 12, weight 52.2 kg, SpO2 98 %.    Vent Mode: BIPAP FiO2 (%):  [40 %-50 %] 40 % Set Rate:  [10 bmp] 10 bmp PEEP:  [5 cmH20] 5 cmH20   Intake/Output Summary (Last 24 hours) at 10/28/2019 0809 Last data filed at 10/28/2019 0400 Gross per 24 hour  Intake 1054.26 ml  Output 850 ml  Net 204.26 ml   Filed Weights   10/24/19 0500 10/25/19 0423 10/27/19 0500  Weight: 51.9 kg 51.6 kg 52.2 kg   Examination:  General:  Elderly female sleeping on BiPAP HEENT: full face BiPAP mask Neuro: Difficult to arouse CV: IRIR PULM:  Clear bilateral breath sounds, good compliance with BiPAP GI: soft, bs active, NT Extremities: warm/dry, no LE edema  Skin: no rashes   Resolved Hospital Problem list     Assessment & Plan:  Acute Myasthenia gravis exacerbation P:  IVIG started 2/6 for 5 days, not likely to see improvement for 4-5  days post infusion per Neuro Mestinon started 2/5 Neurology following 48 hrs decadron done tonight Will schedule 50mg  solumedrol to start tomorrow.  Acute hypoxic respiratory distress- related to above Stridor 2/8, 2/9 High risk for aspiration  Dysphagia  P:  DNI/ DNR BiPAP as needed  Decadron for today Trending NIFs/ FVC  SLP following Have had multiple family discussions. Should her condition worsen, they endorse transition to comfort care.  Ok with PCCM to be monitored in PCU, defer to primary   Hypertension P:    lopressor / hydralazine IV prn while NPO  PAF- new, likely due to stress, hypoxia, respiratory distress P:  Supportive treatment with BiPAP/ O2  Lopressor prn for rate control   Goal > 4; Mag > 2   UTI P:  Ceftriaxone x3 days  Follow UC  Trend WBC/ fever curve   Best practice:  Diet: NPO Pain/Anxiety/Delirium protocol (if indicated): N/A VAP protocol (if indicated): N/A DVT prophylaxis: Lovenox GI prophylaxis: protonix Glucose control: NA Mobility: Bedrest Code Status: DNI/ DNR Family Communication: will update family bedside when they arrive Disposition: ICU   Labs   CBC: Recent Labs  Lab 10/23/19 1853 10/23/19 1906 10/23/19 2108 10/25/19 0622 10/26/19 1203  WBC 5.4  --  4.9  --  5.7  NEUTROABS 2.9  --   --   --   --   HGB 14.3 14.6 14.1 11.9* 13.8  HCT 43.9 43.0 43.6 35.0* 42.4  MCV 96.7  --  96.7  --  96.1  PLT 224  --  198  --  0000000    Basic Metabolic Panel: Recent Labs  Lab 10/23/19 1853 10/23/19 1853 10/23/19 1906 10/24/19 0346 10/25/19 0622 10/26/19 1203 10/28/19 0638  NA 140   < > 139 141 136 133* 136  K 3.7   < > 3.5 3.9 3.8 3.6 4.0  CL 103  --  106 105  --  101 103  CO2 24  --   --  25  --  21* 22  GLUCOSE 106*  --  98 106*  --  145* 141*  BUN 11  --  12 9  --  8 17  CREATININE 0.73  --  0.70 0.63  --  0.70 0.66  CALCIUM 10.0  --   --  9.3  --  9.0 8.9  MG  --   --   --   --   --  1.8  --    < > = values in this interval not displayed.   GFR: Estimated Creatinine Clearance: 34.6 mL/min (by C-G formula based on SCr of 0.66 mg/dL). Recent Labs  Lab 10/23/19 1853 10/23/19 2108 10/26/19 1203  WBC 5.4 4.9 5.7    Liver Function Tests: Recent Labs  Lab 10/23/19 1853  AST 35  ALT 33  ALKPHOS 46  BILITOT 0.8  PROT 7.3  ALBUMIN 4.3   No results for input(s): LIPASE, AMYLASE in the last 168 hours. No results for input(s): AMMONIA in the last 168 hours.  ABG    Component Value Date/Time   PHART 7.397 10/25/2019 0622   PCO2ART  39.2 10/25/2019 0622   PO2ART 73.0 (L) 10/25/2019 0622   HCO3 24.1 10/25/2019 0622   TCO2 25 10/25/2019 0622   ACIDBASEDEF 1.0 10/25/2019 0622   O2SAT 95.0 10/25/2019 0622     Coagulation Profile: Recent Labs  Lab 10/23/19 1853  INR 1.1    Cardiac Enzymes: No results for input(s): CKTOTAL, CKMB, CKMBINDEX, TROPONINI in the last  168 hours.  HbA1C: No results found for: HGBA1C  CBG: Recent Labs  Lab 10/27/19 0349 10/27/19 0832 10/27/19 1135 10/27/19 1805 10/27/19 2357  GLUCAP 124* 129* 151* 153* 127*      Georgann Housekeeper, AGACNP-BC [facility]  See Amion for personal pager PCCM on call pager [phone]  10/28/2019 8:34 AM

## 2019-10-28 NOTE — Progress Notes (Signed)
Patient was transported to 3W35 without any complications. Report was given to 3W RT.

## 2019-10-28 NOTE — Progress Notes (Signed)
SLP Cancellation Note  Patient Details Name: Elaine Owens MRN: [id number] DOB: 11-Oct-1925   Cancelled treatment:       Reason Eval/Treat Not Completed: Medical issues which prohibited therapy pt back on BiPAP    Lenore Manner 10/28/2019, 9:15 AM

## 2019-10-28 NOTE — Progress Notes (Signed)
RN called due to an increase WOB. Rhonchi and coarse crackles noted throughout lung fields. Patient placed back on BiPAP at this time.

## 2019-10-28 NOTE — Progress Notes (Signed)
Attempted to complete NIF and VC.  Patient was on Bipap and is now resting after receiving Fentanyl.  Daughter at the bedside.     NIF & VC not done this shift.

## 2019-10-28 NOTE — Progress Notes (Signed)
Reason for consult:   Subjective: Patient sitting in bed, breathing with assistance of BiPAP.  She has been transferred out of the ICU.  She continues to have difficulty breathing and has been placed again on BiPAP   ROS: negative except above  Examination  Vital signs in last 24 hours: Temp:  [97.3 F (36.3 C)-97.8 F (36.6 C)] 97.7 F (36.5 C) (02/10 0800) Pulse Rate:  [65-168] 134 (02/10 0950) Resp:  [12-27] 27 (02/10 0950) BP: (94-181)/(35-128) 155/72 (02/10 0950) SpO2:  [94 %-100 %] 94 % (02/10 0950) FiO2 (%):  [40 %] 40 % (02/10 0950)  General: Sitting in BEd CVS: pulse-normal rate and rhythm RS: breathing with assistance BiPAP, no wheeze, ronchi + Extremities: normal   Neuro: MS: Alert, oriented, follows commands CN: pupils equal and reactive,  EOMI, face symmetric, tongue midline, normal sensation over face, Motor: 5/5 strength in all 4 extremities Reflexes: 2+ bilaterally over patella, biceps, plantars: flexor Coordination: normal Gait: not tested  Basic Metabolic Panel: Recent Labs  Lab 10/23/19 1853 10/23/19 1853 10/23/19 1906 10/24/19 0346 10/25/19 0622 10/26/19 1203 10/28/19 0638  NA 140   < > 139 141 136 133* 136  K 3.7   < > 3.5 3.9 3.8 3.6 4.0  CL 103  --  106 105  --  101 103  CO2 24  --   --  25  --  21* 22  GLUCOSE 106*  --  98 106*  --  145* 141*  BUN 11  --  12 9  --  8 17  CREATININE 0.73  --  0.70 0.63  --  0.70 0.66  CALCIUM 10.0   < >  --  9.3  --  9.0 8.9  MG  --   --   --   --   --  1.8  --    < > = values in this interval not displayed.    CBC: Recent Labs  Lab 10/23/19 1853 10/23/19 1906 10/23/19 2108 10/25/19 0622 10/26/19 1203  WBC 5.4  --  4.9  --  5.7  NEUTROABS 2.9  --   --   --   --   HGB 14.3 14.6 14.1 11.9* 13.8  HCT 43.9 43.0 43.6 35.0* 42.4  MCV 96.7  --  96.7  --  96.1  PLT 224  --  198  --  178     Coagulation Studies: No results for input(s): LABPROT, INR in the last 72 hours.  Imaging Reviewed:      ASSESSMENT AND PLAN  Myasthenia gravis exacerbation/crisis Urinary tract infection  Plan -Continue IVIG for total of 5 days, day 4/5 -Speech evaluation for swallowing, if patient fails consider core track -Respiratory care, currently off BiPAP, appreciate CCM assistance -Continue Decadron for now, will increase to 4mg  q6h, can eventually be switched to prednisone 20 mg daily -Continue Mestinon at 2 mg IV every 6, can eventually switch to 60 mg p.o. every 6hrs after swallow evaluation - completed antibiotics for UTI   Dysphagia secondary to above -Discussed with family to place cortrack for tube feeds, family agreed. Patient will need to be able to be consistently tolerating being off BiPAP first -Speech therapy following  ? Atrial Fibrillation - tele showed Afib?   Goals of care -Patient is DNR/DNI -Spoke with family, take several days for IVIG to work.  Receiving last dose today, plan will be to continue BiPAP for 3 more days, if patient does not continue to improve, will need  to discuss about pursuing comfort measures. -palliative consult has been placed     CRITICAL CARE Performed by: Lanice Schwab Samvel Zinn   Total critical care time: 35  minutes  Critical care time was exclusive of separately billable procedures and treating other patients.  Critical care was necessary to treat or prevent imminent or life-threatening deterioration.  Critical care was time spent personally by me on the following activities: development of treatment plan with patient and/or surrogate as well as nursing, discussions with consultants, evaluation of patient's response to treatment, examination of patient, obtaining history from patient or surrogate, ordering and performing treatments and interventions, ordering and review of laboratory studies, ordering and review of radiographic studies, pulse oximetry and re-evaluation of patient's condition.     Karena Addison Venisha Boehning [facility] Pager Number [phone] For questions after 7pm please refer to AMION to reach the Neurologist on call

## 2019-10-28 NOTE — Progress Notes (Addendum)
Received patient on 12/5 40% and hand mitts.  Clarifying restraint orders.  NNP paged.

## 2019-10-29 ENCOUNTER — Inpatient Hospital Stay (HOSPITAL_COMMUNITY): Payer: Medicare HMO

## 2019-10-29 DIAGNOSIS — Z7189 Other specified counseling: Secondary | ICD-10-CM

## 2019-10-29 DIAGNOSIS — Z515 Encounter for palliative care: Secondary | ICD-10-CM

## 2019-10-29 LAB — GLUCOSE, CAPILLARY
Glucose-Capillary: 142 mg/dL — ABNORMAL HIGH (ref 70–99)
Glucose-Capillary: 143 mg/dL — ABNORMAL HIGH (ref 70–99)

## 2019-10-29 MED ORDER — MORPHINE SULFATE (PF) 2 MG/ML IV SOLN: 1.0000 mg | INTRAVENOUS | Status: DC | PRN

## 2019-10-29 MED ORDER — LORAZEPAM 2 MG/ML IJ SOLN: 1.0000 mg | INTRAMUSCULAR | Status: DC | PRN

## 2019-10-29 MED ORDER — MORPHINE SULFATE (PF) 2 MG/ML IV SOLN: 1.0000 mg | Freq: Once | INTRAVENOUS | Status: DC

## 2019-10-29 MED ORDER — MORPHINE SULFATE (PF) 2 MG/ML IV SOLN: 1.0000 mg | INTRAVENOUS | Status: DC

## 2019-10-29 MED ORDER — HALOPERIDOL LACTATE 5 MG/ML IJ SOLN: 2.0000 mg | Freq: Four times a day (QID) | INTRAMUSCULAR | Status: DC | PRN

## 2019-10-29 MED ORDER — POLYVINYL ALCOHOL 1.4 % OP SOLN
1.0000 [drp] | Freq: Four times a day (QID) | OPHTHALMIC | Status: DC | PRN
  Filled 2019-10-29: qty 15

## 2019-10-29 MED ORDER — HYDROMORPHONE HCL 1 MG/ML IJ SOLN: 1.0000 mg | INTRAMUSCULAR | Status: DC | PRN

## 2019-10-29 MED ORDER — LORAZEPAM 2 MG/ML IJ SOLN
1.0000 mg | INTRAMUSCULAR | Status: DC | PRN
  Administered 2019-10-29: 17:00:00 2 mg via INTRAVENOUS
  Filled 2019-10-29: qty 1

## 2019-10-29 MED ORDER — MORPHINE SULFATE (PF) 2 MG/ML IV SOLN
INTRAVENOUS | Status: AC
  Administered 2019-10-29: 1 mg via INTRAVENOUS
  Filled 2019-10-29: qty 1

## 2019-10-29 MED ORDER — LORAZEPAM 2 MG/ML IJ SOLN: 1.0000 mg | INTRAMUSCULAR | Status: DC

## 2019-10-29 MED ORDER — BIOTENE DRY MOUTH MT LIQD: 15.0000 mL | OROMUCOSAL | Status: DC | PRN

## 2019-10-29 MED ORDER — GLYCOPYRROLATE 0.2 MG/ML IJ SOLN: 0.4000 mg | INTRAMUSCULAR | Status: DC | PRN

## 2019-10-29 MED ORDER — LORAZEPAM 2 MG/ML IJ SOLN
1.0000 mg | INTRAMUSCULAR | Status: DC
  Administered 2019-10-29 – 2019-10-30 (×2): 2 mg via INTRAVENOUS
  Administered 2019-10-30: 04:00:00 1 mg via INTRAVENOUS
  Administered 2019-10-30: 09:00:00 2 mg via INTRAVENOUS
  Administered 2019-10-30: 1 mg via INTRAVENOUS
  Filled 2019-10-29 (×5): qty 1

## 2019-10-29 MED ORDER — MORPHINE SULFATE (PF) 2 MG/ML IV SOLN
INTRAVENOUS | Status: AC
  Administered 2019-10-29: 10:00:00 1 mg via INTRAVENOUS
  Filled 2019-10-29: qty 1

## 2019-10-29 MED ORDER — MORPHINE SULFATE (PF) 2 MG/ML IV SOLN
1.0000 mg | INTRAVENOUS | Status: DC | PRN
  Administered 2019-10-29: 14:00:00 1 mg via INTRAVENOUS
  Filled 2019-10-29 (×2): qty 1

## 2019-10-29 MED ORDER — MORPHINE SULFATE (PF) 2 MG/ML IV SOLN: 1.0000 mg | Freq: Once | INTRAVENOUS | Status: AC

## 2019-10-29 MED ORDER — HYDROMORPHONE HCL 1 MG/ML IJ SOLN
1.0000 mg | INTRAMUSCULAR | Status: DC
  Administered 2019-10-29 – 2019-10-30 (×5): 1 mg via INTRAVENOUS
  Filled 2019-10-29 (×5): qty 1

## 2019-10-29 MED ORDER — HYDROMORPHONE HCL 1 MG/ML IJ SOLN: 1.0000 mg | INTRAMUSCULAR | Status: DC

## 2019-10-29 NOTE — Progress Notes (Signed)
At 0854 central monitoring called with pt having SVT rate 177. Pt was fluctuating HR 110's-190's, BP readings 200/100's, but would fluctuate back to 110's,  RR 20-22, SATS 99-100% on 4L/[place], pt able to whisper "I can't breathe", pt upper airway "throat" sounded wet but not lung fields" Respiratory paged to come to room.  Etta Quill PA paged and informed of the above.   Rapid Response nurse also called and requested pt assessment.   PA and Dr Lorraine Lax came to see pt. Pt given metoprolol for HR.  130-170's. Pt in and out of afib/ST/SVT.  EKG done.   Dr Aroor notified famiily of pt status, and requested family come to bedside as soon as possible.

## 2019-10-29 NOTE — Consult Note (Signed)
Consultation Note Date: 10/29/2019   Patient Name: Elaine Owens  DOB: 14-Mar-1926  MRN: [id number]  Age / Sex: 86 y.o., female  PCP: Jettie Booze, NP Referring Physician: Kerney Elbe, MD  Reason for Consultation: Establishing goals of care  HPI/Patient Profile: 86 y.o. female  with past medical history of myasthenia gravis, history of breast cancer (s/p mastectomy), hypertension, arthritis admitted on 10/23/2019 with increased difficulty swallowing and admitted for myasthenia gravis and has required BiPAP at times with plans for Cortrak placement today.   Clinical Assessment and Goals of Care: I met today at Elaine Owens bedside after being called by neurology. She has had a difficult progression and hospitalization over the past week. She has had acute decline today with distress with tachycardia, diaphoresis, hypertension and received 1 mg morphine with good relief.   I met with family throughout the day. Elaine Owens did abruptly arouse and become responsive. Family clarifies goals for comfort care at this time and acknowledge her struggles over the past week and they believe she is tired. They do not want her to suffer anymore and I do recommend that comfort care if appropriate at this stage in her illness.   I returned to bedside as she continues to be alert but does not appear comfortable. She is no longer responding to the lower doses of medications so I have adjusted medications to be scheduled and as needed to ensure comfort. IF she continues to be uncomfortable after these measures she could benefit from opioid infusion. I am rotating from morphine to dilaudid as she seems very flushed to see if she is having any reaction to the morphine and if the dilaudid gives her improved relief. She did during my visit respond and appear more comfortable after larger dose of Ativan.   Family are also interested in  pursuing hospice facility [facility]) which I feel is appropriate at this time. Although there is the chance that she may decline further overnight and could become unstable for transfer.   I will ask my colleague to follow up on symptoms and appropriateness for hospice transfer (if bed available) tomorrow.   Primary Decision Maker NEXT OF KIN adult children    SUMMARY OF RECOMMENDATIONS   - Comfort care  Code Status/Advance Care Planning:  DNR   Symptom Management:   Scheduled dilaudid 1 mg IV and ativan 1-2 mg IV every 4 hours scheduled and every hours as needed between scheduled doses to ensure comfort. If this continues to be ineffective to achieve comfort: recommend dilaudid infusion 1 mg/hr with 1 mg bolus via infusion every 15 min as needed and may increase dosage of basal rate and bolus as needed to achieve comfort.   Robinul and Haldol as needed for secretions/gurgling and agitation.   Palliative Prophylaxis:   Aspiration, Delirium Protocol, Eye Care, Frequent Pain Assessment, Oral Care and Turn Reposition  Additional Recommendations (Limitations, Scope, Preferences):  Full Comfort Care  Psycho-social/Spiritual:   Desire for further Chaplaincy support:yes  Additional Recommendations: Grief/Bereavement Support  Prognosis:   Hours - Days  Discharge Planning: Hospice facility      Primary Diagnoses: Present on Admission: . Myasthenic crisis ([place])   I have reviewed the medical record, interviewed the patient and family, and examined the patient. The following aspects are pertinent.  Past Medical History:  Diagnosis Date  . Arthritis   . Complication of anesthesia    hard to wake up  . Hypertension   . Immune deficiency disorder ([place])   . lt breast ca dx'd 1978 or 1988 pt unsure   lt masectomy; tamoxifen completed  . Myasthenia gravis [facility])    Social History   Socioeconomic History  . Marital status: Single    Spouse name: Not on file  .  Number of children: Not on file  . Years of education: Not on file  . Highest education level: Not on file  Occupational History  . Occupation: Retired  Tobacco Use  . Smoking status: Never Smoker  . Smokeless tobacco: Never Used  Substance and Sexual Activity  . Alcohol use: No  . Drug use: Not on file  . Sexual activity: Never  Other Topics Concern  . Not on file  Social History Narrative  . Not on file   Social Determinants of Health   Financial Resource Strain:   . Difficulty of Paying Living Expenses: Not on file  Food Insecurity:   . Worried About Charity fundraiser in the Last Year: Not on file  . Ran Out of Food in the Last Year: Not on file  Transportation Needs:   . Lack of Transportation (Medical): Not on file  . Lack of Transportation (Non-Medical): Not on file  Physical Activity:   . Days of Exercise per Week: Not on file  . Minutes of Exercise per Session: Not on file  Stress:   . Feeling of Stress : Not on file  Social Connections:   . Frequency of Communication with Friends and Family: Not on file  . Frequency of Social Gatherings with Friends and Family: Not on file  . Attends Religious Services: Not on file  . Active Member of Clubs or Organizations: Not on file  . Attends Archivist Meetings: Not on file  . Marital Status: Not on file   Family History  Problem Relation Age of Onset  . Colon cancer Father   . Asthma Father   . Liver cancer Mother    Scheduled Meds: . Chlorhexidine Gluconate Cloth  6 each Topical Daily  . dexamethasone (DECADRON) injection  4 mg Intravenous Q6H  . enoxaparin (LOVENOX) injection  40 mg Subcutaneous Q24H  . mouth rinse  15 mL Mouth Rinse BID  . pantoprazole (PROTONIX) IV  40 mg Intravenous Q24H  . pyridostigmine  2 mg Intravenous Q6H   Continuous Infusions: . sodium chloride Stopped (10/27/19 0924)   PRN Meds:.sodium chloride, acetaminophen **OR** acetaminophen, fentaNYL (SUBLIMAZE) injection,  hydrALAZINE, LORazepam, metoprolol tartrate Allergies  Allergen Reactions  . Ciprofloxacin Other (See Comments)    Can not take if patient has myastenia gravis  . Doxycycline Hyclate Other (See Comments)    Patient preference  . Morphine Nausea And Vomiting  . Morphine And Related Nausea And Vomiting    Mad her extremely sick   Review of Systems  Unable to perform ROS: Acuity of condition    Physical Exam Vitals and nursing note reviewed.  Constitutional:      General: She is in acute distress.     Appearance: She is  ill-appearing.  Cardiovascular:     Rate and Rhythm: Tachycardia present.  Pulmonary:     Effort: No tachypnea, accessory muscle usage or respiratory distress.     Comments: Shallow breathes Abdominal:     General: Abdomen is flat.     Palpations: Abdomen is soft.  Skin:    Comments: Clammy  Neurological:     Comments: Fluctuates; she did arouse after a couple hours with family at bedside and attempts to communicate     Vital Signs: BP (!) 156/67 (BP Location: Left Arm)   Pulse 71   Temp 98.4 F (36.9 C) (Oral)   Resp 18   Wt 54.1 kg   SpO2 100%   BMI 22.54 kg/m  Pain Scale: PAINAD   Pain Score: 0-No pain   SpO2: SpO2: 100 % O2 Device:SpO2: 100 % O2 Flow Rate: .O2 Flow Rate (L/min): (4L)  IO: Intake/output summary: No intake or output data in the 24 hours ending 10/29/19 0951  LBM: Last BM Date: 10/27/19 Baseline Weight: Weight: 52 kg Most recent weight: Weight: 54.1 kg     Palliative Assessment/Data:     Time In/Out: 1020-1100; 1650-1730 Time Total: 80 min Greater than 50%  of this time was spent counseling and coordinating care related to the above assessment and plan.  Signed by: Vinie Sill, NP Palliative Medicine Team Pager # [phone] (M-F 8a-5p) Team Phone # [phone] (Nights/Weekends)

## 2019-10-29 NOTE — Progress Notes (Signed)
Palliative NP Elmo Putt back in the evening to talk with family, family member expecting pt to have pain med given to pt on scheduled regimen, orders at that time were prn only.  Family also was asking about pt going to a hospice center.  Contacted NP to come talk with family. Medications were adjusted, see new orders.

## 2019-10-29 NOTE — Progress Notes (Signed)
Initial Nutrition Assessment  DOCUMENTATION CODES:   Not applicable  INTERVENTION:  Follow for decision regarding comfort care vs need for nutrition support and make recommendations as warranted.  NUTRITION DIAGNOSIS:   Inadequate oral intake related to inability to eat as evidenced by NPO status.   GOAL:   Provide needs based on ASPEN/SCCM guidelines    MONITOR:   Other (Comment)(Decision re comfort care vs need for nutrition support)  REASON FOR ASSESSMENT:   NPO/Clear Liquid Diet    ASSESSMENT:   Pt with a PMH significant for myasthenia gravis and HTN who presented with slurred speech and difficulty swallowing for 1 week 2/2 myasthenia gravis exacerbation.  RN and pt's daughter in room at time of visit. Pt's daughter tearful. RN informed RD that Palliative Care had just left; pt will likely be made comfort care. RD will follow for decision regarding comfort care.  Medications reviewed and include: Decadron  Labs reviewed. CBGs 142-159   NUTRITION - FOCUSED PHYSICAL EXAM:  Deferred.   Diet Order:   Diet Order            Diet NPO time specified Except for: Ice Chips  Diet effective now              EDUCATION NEEDS:   Not appropriate for education at this time  Skin:  Skin Assessment: Reviewed RN Assessment  Last BM:  2/9  Height:   Ht Readings from Last 1 Encounters:  09/15/19 5\' 1"  (1.549 m)    Weight:   Wt Readings from Last 1 Encounters:  10/29/19 54.1 kg    BMI:  Body mass index is 22.54 kg/m.  Estimated Nutritional Needs:   Kcal:  1300-1500  Protein:  65-75 grams  Fluid:  >1.3L/d   Larkin Ina, MS, RD, LDN RD pager number and weekend/on-call pager number located in [place].

## 2019-10-29 NOTE — Progress Notes (Signed)
NEUROLOGY PROGRESS NOTE  Subjective: Upon walking in room patient having significant difficulty breathing.  She is able to mouth "I cannot breathe ".  She sounds rhonchorous when standing next to her.  Having short inspirations and diaphragmatic breathing  Exam: Vitals:   10/29/19 1100 10/29/19 1200  BP: 133/62 (!) 145/68  Pulse:    Resp: 14 13  Temp:    SpO2: 100% 100%   Physical Exam  Constitutional: Appears well-developed and well-nourished.  Eyes: No scleral injection HENT: No OP obstrucion Head: Normocephalic.  Cardiovascular: Tachycardic at times in SVT Respiratory: Significant labored breathing GI: Soft.  No distension. There is no tenderness.  Skin: WDI   Neuro:  Mental Status: Alert, in distress with significant difficulty breathing.  Is able once in a while to get out "I cannot breathe" Cranial Nerves: II: Blinks to threat III,IV, VI: Looking horizontally at practitioner nurses at her side V,VII: Face symmetric, VIII: hearing normal bilaterally XII: midline tongue extension Motor: Moving arms and legs antigravity with noxious stimuli Sensory: As above, response to noxious stimuli   Medications:  Scheduled: . Chlorhexidine Gluconate Cloth  6 each Topical Daily  . dexamethasone (DECADRON) injection  4 mg Intravenous Q6H  . mouth rinse  15 mL Mouth Rinse BID    Pertinent Labs/Diagnostics: Chest x-ray-no active disease EKG--Sinus rhythm with marked sinus arrhythmia with occasional Premature ventricular complexes   Etta Quill PA-C [facility] [phone]  Assessment:  Is an unfortunate female presenting with myasthenia crisis requiring CPAP.  Patient was receiving IVIG with to day being her last dose. She was transfered to step down without CPAP and this AM found to be in respiratory distress and unable to keep airway clear. She is  DNR/DNI. Due to her  decline her family was contacted and asked to come and Palliative care was also called to  meet with family. Family had discussion and was in agreement with Palliative care/comfort care.   10/29/2019, 1:44 PM   Please see my addendum in seperate progress note.

## 2019-10-29 NOTE — Progress Notes (Signed)
Spoke with RN about patient's ability to do a NIF and VC.  RN stated that patient would not be able to perform tests.  Family is currently at bedside, patient currently progressed to pallative.

## 2019-10-29 NOTE — Progress Notes (Signed)
Called to patient's bedside for difficulty breathing.  Received patient with rapid response team at bedside.  Bipap was initiated as ordered but patient attempted to remove the mask.    PPV discontinued.  Family members contacted by medical staff.

## 2019-10-29 NOTE — Progress Notes (Signed)
NAME:  Tanekia Rugel, MRN:  [id number], DOB:  [birth year], LOS: 6 ADMISSION DATE:  10/23/2019, CONSULTATION DATE: 10/23/2019 REFERRING MD: Leonel Ramsay, CHIEF COMPLAINT: Slurred speech  Brief History   Increasing slurred speech difficulty swallowing for 1 week presumably secondary to myasthenia gravis exacerbation  History of present illness   Patient is a 86 year old with a known history of myasthenia gravis who has been having increased difficulty with slurred speech and saliva management over the last week.  It is unclear as to whether or not she has been taking her Mestinon or not in her database is not complete.  It is unclear as to her outpatient follow-up since her previous similar admission in November 2020. Evaluation the patient is very difficult to understand speech.  She is able to stick her tongue fully out her mouth.  Her peripheral grip strength is currently good.  She does appear to have some shallow respiratory effort but is not having paroxysmal abdominal movements.  She is also able to speak although unintelligibly in what seems like complete sentences.  Serial NIF in the emergency room have been around -10  Past Medical History   has a past medical history of Arthritis, Complication of anesthesia, Hypertension, Immune deficiency disorder ([place]), lt breast ca (dx'd 1978 or 1988 pt unsure), and Myasthenia gravis ([place]).   Significant Hospital Events   2/5: Admission to [facility] 10/23/2019 patient is a DNR although on my discussion with her she agrees to noninvasive BiPAP ventilation.  Started on mestinon, started IVIG at 2am (2/6) 2/6: Today is alert and able to answer simple questions.  Breathing well on my gross evaluation.   2/7: Nif still low per RT report, still not passing swallow.  Awake and alert, pleasant 2/8 had respiratory distress/ stridor in am, started on decadron and agreed to BiPAP; NIF -10, VC 600 ml 2/9 stayed off BiPAP most of the night, but back on  after acute distress this morning.  2/10 transferred out of ICU  Consults:  PCCM  Procedures:   Significant Diagnostic Tests:  NA  Micro Data:  2/5 SARS2/ flu A/B >> neg 2/5 MRSA PCR >> 2/8 UC >>  Antimicrobials:  2/8 ceftriaxone >>  Interim history/subjective:  Off BiPAP most of night again.  Appears to have tolerated well. This AM is very sleepy.  Objective   Blood pressure 121/86, pulse 67, temperature 98.1 F (36.7 C), temperature source Oral, resp. rate 18, weight 54.1 kg, SpO2 99 %.    FiO2 (%):  [40 %] 40 %  No intake or output data in the 24 hours ending 10/29/19 0832 Filed Weights   10/25/19 0423 10/27/19 0500 10/29/19 0421  Weight: 51.6 kg 52.2 kg 54.1 kg   Examination:  General:  Elderly female, in NAD HEENT: [place] / AT. Neuro: Difficult to arouse, minimally responsive CV: IRIR PULM:  Clear bilateral breath sounds GI: soft, bs active, NT Extremities: warm/dry, no LE edema  Skin: no rashes   Assessment & Plan:   Acute Myasthenia gravis exacerbation - s/p 5 days IVIG (not likely to see improvement for 4-5 days post infusion per Neuro). - Continue mestinon.  - Neurology following.  Acute hypoxic respiratory distress- related to above. High risk for aspiration.  - DNI/ DNR - BiPAP as needed for comfort / increased WOB - NIFs/ FVC  - Have had multiple family discussions. Should her condition worsen, they endorse transition to comfort care  Stridor - improving. - Continue decadron  Dysphagia. - SLP  following.  Hypertension. - lopressor / hydralazine IV prn while NPO.  PAF- new, likely due to stress, hypoxia, respiratory distress. - Supportive treatment with BiPAP/ O2.  - Lopressor prn for rate control.   - Goal > 4; Mag > 2.  UTI - s/p course ceftriaxone - No further interventions required.  Best practice:  Diet: NPO Pain/Anxiety/Delirium protocol (if indicated): N/A VAP protocol (if indicated): N/A DVT prophylaxis: Lovenox GI  prophylaxis: protonix Glucose control: NA Mobility: Bedrest Code Status: DNI/ DNR Family Communication: will update family bedside when they arrive Disposition: tele   Montey Hora, [facility] 10/29/2019, 8:50 AM

## 2019-10-29 NOTE — Progress Notes (Signed)
Interim Note  -Spoke to family, discussed that patient aspirated and appears uncomfortable.  Patient with SVT vs Afib, heart rate improved after giving IV metoprolol.  Patient became more comfortable after 1 mg of morphine.  Patient refused BiPAP.  -After discussion,  family has made her comfort care.  Appreciate palliative care assistance.    See of progress note by Etta Quill, PA-C for further details.

## 2019-10-29 NOTE — Progress Notes (Signed)
Daughter at bedside at this time, states she spoke with Elmo Putt NP with palliative care.  Pt at rest, eyes closed, nonlabored breathing, HR in 70's. Did not attempt to wake pt up. Daughter denied needing anything at this time, states her sister and niece are coming to the hospital to be with pt also.

## 2019-10-29 NOTE — Progress Notes (Signed)
SLP Cancellation Note  Patient Details Name: Elaine Owens MRN: [id number] DOB: 06-18-1926   Cancelled treatment:       Reason Eval/Treat Not Completed: Other (comment)(RN indicated that family has decided on comfort care. SLP orders have been cancelled by palliative care NP.)  Hayat [place] I. Hardin Negus, [place], [place] Office number [phone] Pager [place] 10/29/2019, 12:43 PM

## 2019-10-29 NOTE — Progress Notes (Signed)
Responded to unit page and spiritual care consult to support patient and family at bedside. Spoke with several staff concerning patient.  Patient is a DNR.  Family at bedside wanted prayer for patient and themselves. Patient just opened eyes and family was glad to see that.  Prayer was done and also provided ministry of presence, emotional and spiritual support .  Chaplain available as needed.  Jaclynn Major, [place], [facility], Pager [phone]

## 2019-10-29 NOTE — Progress Notes (Signed)
Geneticist, molecular received call from daughter, [name], with request for pt to transfer to [facility] for EOL care. Pt's chart currently in review by hospice staff to determine eligibility for [facility].   Writer spoke with pt's daughter in length to confirm interest. Answered all questions and explained hospice philosophy. Explained that [place] will inform her if pt is approved, as well as hospital staff.   Please do not hesitate to outreach with any questions.   Freddie Breech, RN [facility] Liaison [phone]

## 2019-10-29 NOTE — Progress Notes (Signed)
Patient maintained oxygenation on 4 Liters [place] all night without any destating events or a need for bipap.

## 2019-10-29 NOTE — Significant Event (Addendum)
Rapid Response Event Note  Overview: Time Called: 0923 Arrival Time: 0930 Event Type: Respiratory, MEWS Called for pt having SBP >200 and HR up to 170s bpm.  Initial Focused Assessment/Interventions: Pt lying in bed, blank stare, minimal response to visual threat. RR is controlled, SpO2 99-100% on 4LNC. Lung sounds are ronchus t/o, increased work of breathing, accessory muscle use noted and irregular breathing pattern. Concern for aspiration. Pt's tongue is dry. Rhythm appears to be sinus tachycardia, rate is regular and p-wave is noted- intermittently rhythm flips into afib. Pulses palpable. Pt intermittently responds with single word response. Pt pale, cool, and moist to touch.   0940: 5mg  IV Lopressor given for HR and BP 0945: 12 lead EKG 0952: 1 mg IV morphine 0955: NTS suctioning- Blood tinged secretions. Pt had non-sustained bradycardia as low as 38 bpm during and following suctioning. 1000: Palliative care NP at bedside to evaluate patient. Dr. Lorraine Lax updated family via phone call.   Attempted to place pt on BiPAP, pt began pulling at mask. BiPAP removed and pt placed back on 4LNC. Oxygen saturation remains 100%. Breathing improved following administration of morphine: less accessory muscle use, decreased work of breathing, and breathing pattern appears more rhythmic. Pt now resting comfortably. BP 179/90, HR 87, RR 15, SpO2 100% on 4LNC.   Plan of Care (if not transferred): -Discuss transition to comfort care with family. Awaiting family arrival.   Call rapid response for further needs.  Event Summary: Name of Physician Notified: Dr. Lorraine Lax at 0935(MD at bedside, rounding on pt.)    at    Outcome: Other (Comment)  Palliative Care consulted and at bedside to evaluate patient.  Event End Time: [place]

## 2019-10-29 NOTE — Progress Notes (Signed)
SLP Cancellation Note  Patient Details Name: Elaine Owens MRN: [id number] DOB: 01/14/1926   Cancelled treatment:       Reason Eval/Treat Not Completed: Medical issues which prohibited therapy(RN indicated that rapid response was called this morning, that she is not medically stable, and that palliative care is currently in with the pt and awaiting arrival of family to discuss possible transition to comfort care. SLP will follow up pending decision regarding goals of care.)  Krrish Freund I. Hardin Negus, [place], [place] Office number [phone] Pager [place] 10/29/2019, 10:54 AM

## 2019-10-30 NOTE — Care Management Important Message (Signed)
Important Message  Patient Details  Name: Elaine Owens MRN: [id number] Date of Birth: 02-11-1926   Medicare Important Message Given:  Yes     Orbie Pyo 10/30/2019, 1:46 PM

## 2019-10-30 NOTE — Progress Notes (Signed)
Nutrition Brief Note  Chart reviewed. Pt now transitioning to comfort care.  No further nutrition interventions warranted at this time.  Please re-consult as needed.   Kastiel Simonian, MS, RD, LDN RD pager number and weekend/on-call pager number located in [place].    

## 2019-10-30 NOTE — Plan of Care (Signed)
Adequate for discharge.

## 2019-10-30 NOTE — Discharge Instructions (Signed)
Myasthenia Gravis Myasthenia gravis (MG) is a long-term (chronic) condition that causes weakness in the muscles you can control (voluntary muscles). MG can affect any voluntary muscle. The muscles most often affected are the ones that control:  Eye movement.  Facial movements.  Swallowing. MG is a disease in which the body's disease-fighting system (immune system) attacks its own healthy tissues (autoimmune disease). When you have MG, your immune system makes proteins (antibodies) that block the chemical (acetylcholine) that your body needs to send nerve signals to your muscles. This causes muscle weakness. What are the causes? The exact cause of MG is not known. What increases the risk? The following factors may make you more likely to develop this condition:  Having an enlarged thymus gland. The thymus gland is located under the breastbone. It makes certain cells for the immune system.  Having a family history of MG. What are the signs or symptoms? Symptoms of MG may include:  Drooping eyelids.  Double vision.  Muscle weakness that gets worse with activity and gets better after rest.  Difficulty walking.  Trouble chewing and swallowing.  Trouble making facial expressions.  Slurred speech.  Weakness of the arms, hands, and legs. Sudden, severe difficulty breathing (myasthenic crisis) may develop after having:  An infection.  A fever.  A bad reaction to a medicine. Myasthenic crisis requires emergency breathing support. Sometimes symptoms of MG go away for a while (remission) and then come back later. How is this diagnosed? This condition may be diagnosed based on:  Your symptoms and medical history.  A physical exam.  Blood tests.  Tests of your muscle strength and function.  Imaging tests, such as a CT scan or an MRI. How is this treated? The goal of treatment is to improve muscle strength. Treatment may include:  Taking medicine.  Making lifestyle  changes that focus on saving your energy.  Doing physical therapy to gain strength.  Having surgery to remove the thymus gland (thymectomy). This may result in a long remission for some people.  Having a procedure to remove the acetylcholine antibodies (plasmapheresis).  Getting emergency breathing support, if you experience myasthenic crisis. If you experience remission, you may be able to stop treatment and then resume treatment when your symptoms return. Follow these instructions at home:   Take over-the-counter and prescription medicines only as told by your health care provider.  Get plenty of rest and sleep. Take frequent breaks to rest your eyes, especially when in bright light or working on a computer.  Maintain a healthy diet and a healthy weight. Work with your health care provider or a diet and nutrition specialist (dietitian) if you need help.  Do exercises as told by your health care provider or physical therapist.  Do not use any products that contain nicotine or tobacco, such as cigarettes and e-cigarettes. If you need help quitting, ask your health care provider.  Prevent infections by: ? Washing your hands often with soap and water. If soap and water are not available, use hand sanitizer. ? Avoiding contact with other people who are sick. ? Avoiding touching your eyes, nose, and mouth. ? Cleaning surfaces in your home that are touched often using a disinfectant.  Keep all follow-up visits as told by your health care provider. This is important. Contact a health care provider if:  Your symptoms change or get worse, especially after having a fever or infection. Get help right away if:  You have trouble breathing. Summary  Myasthenia gravis (MG) is   a long-term (chronic) condition that causes weakness in the muscles you can control (voluntary muscles).  A symptom of MG is muscle weakness that gets worse with activity and gets better after rest.  Sudden, severe  difficulty breathing (myasthenic crisis) may develop after having an infection, a fever, or a bad reaction to a medicine.  The goal of treatment is to improve muscle strength. Treatment may include medicines, lifestyle changes, physical therapy, surgery, plasmapheresis, or emergency breathing support. This information is not intended to replace advice given to you by your health care provider. Make sure you discuss any questions you have with your health care provider. Document Revised: 09/16/2017 Document Reviewed: 09/16/2017 Elsevier Patient Education  2020 [employer]  

## 2019-10-30 NOTE — Progress Notes (Signed)
[facility] Documentation  °   °Pt has been approved for [facility] transfer. [facility] does have a bed available for pt today. Paperwork has been completed and transportation can be arranged.   °   °Please call [facility] at [phone] to give charge nurse report and fax discharge summary to [phone].  °   °Please dc any lines. May leave catheter in place if pt has one. Please send pt to [facility] with DNR paperwork.   °   °Please call with any questions.   °   °Thank you,   °Jennifer Love, RN  ° °

## 2019-10-30 NOTE — Progress Notes (Signed)
Discharge report given to [facility] at [facility].

## 2019-10-30 NOTE — TOC Transition Note (Signed)
Transition of Care [facility]) - CM/SW Discharge Note   Patient Details  Name: Elaine Owens MRN: [id number] Date of Birth: 1926/07/16  Transition of Care [facility]) CM/SW Contact:  Geralynn Ochs, LCSW Phone Number: 10/30/2019, 2:58 PM   Clinical Narrative:   Nurse to call report to [phone]    Final next level of care: [place] Barriers to Discharge: Barriers Resolved   Patient Goals and CMS Choice   CMS Medicare.gov Compare Post Acute Care list provided to:: Patient Represenative (must comment) Choice offered to / list presented to : Adult Children  Discharge Placement              Patient chooses bed at: [facility]) Patient to be transferred to facility by: [facility] Name of family member notified: Daughter Patient and family notified of of transfer: 10/30/19  Discharge Plan and Services                                     Social Determinants of Health (SDOH) Interventions     Readmission Risk Interventions No flowsheet data found.

## 2019-10-30 NOTE — Discharge Summary (Addendum)
Neurology Discharge Summary  Patient ID: Elaine Owens   MRN: [id number]      DOB: April 05, 1926  Date of Admission: 10/23/2019 Date of Discharge: 10/30/2019  Attending Physician:  Aroor, Lanice Schwab, MD, Neurology Consultant(s):   Treatment Team:   MD palliative  Patient's PCP:  Jettie Booze, NP  DISCHARGE DIAGNOSIS:  Active Problems:   Myasthenic crisis [facility])   Terminal care   Goals of care, counseling/discussion   Palliative care encounter   Allergies as of 10/30/2019      Reactions   Ciprofloxacin Other (See Comments)   Can not take if patient has myastenia gravis   Doxycycline Hyclate Other (See Comments)   Patient preference   Morphine Nausea And Vomiting   Morphine And Related Nausea And Vomiting   Mad her extremely sick      Medication List    STOP taking these medications   Acetaminophen 8 Hour 650 MG CR tablet Generic drug: acetaminophen   MELATONIN PO   polyethylene glycol 17 g packet Commonly known as: MIRALAX / GLYCOLAX   pyridostigmine 60 MG tablet Commonly known as: MESTINON   vitamin B-12 1000 MCG tablet Commonly known as: CYANOCOBALAMIN       LABORATORY STUDIES CBC    Component Value Date/Time   WBC 5.7 10/26/2019 1203   RBC 4.41 10/26/2019 1203   HGB 13.8 10/26/2019 1203   HCT 42.4 10/26/2019 1203   PLT 178 10/26/2019 1203   MCV 96.1 10/26/2019 1203   MCH 31.3 10/26/2019 1203   MCHC 32.5 10/26/2019 1203   RDW 12.8 10/26/2019 1203   LYMPHSABS 1.9 10/23/2019 1853   MONOABS 0.4 10/23/2019 1853   EOSABS 0.1 10/23/2019 1853   BASOSABS 0.0 10/23/2019 1853   CMP    Component Value Date/Time   NA 136 10/28/2019 0638   K 4.0 10/28/2019 0638   CL 103 10/28/2019 0638   CO2 22 10/28/2019 0638   GLUCOSE 141 (H) 10/28/2019 0638   BUN 17 10/28/2019 0638   CREATININE 0.66 10/28/2019 0638   CALCIUM 8.9 10/28/2019 0638   PROT 7.3 10/23/2019 1853   ALBUMIN 4.3 10/23/2019 1853   AST 35 10/23/2019 1853   ALT 33 10/23/2019 1853   ALKPHOS 46 10/23/2019 1853   BILITOT 0.8 10/23/2019 1853   GFRNONAA >60 10/28/2019 0638   GFRAA >60 10/28/2019 0638   COAGS Lab Results  Component Value Date   INR 1.1 10/23/2019   INR 1.1 08/02/2019   INR 1.07 10/11/2018   Lipid PanelNo results found for: CHOL, TRIG, HDL, CHOLHDL, VLDL, LDLCALC HgbA1C No results found for: HGBA1C Urinalysis    Component Value Date/Time   COLORURINE YELLOW 10/23/2019 2030   APPEARANCEUR HAZY (A) 10/23/2019 2030   LABSPEC 1.015 10/23/2019 2030   PHURINE 6.0 10/23/2019 2030   GLUCOSEU 50 (A) 10/23/2019 2030   [place] NEGATIVE 10/23/2019 2030   [place] 10/23/2019 2030   KETONESUR 20 (A) 10/23/2019 2030   PROTEINUR NEGATIVE 10/23/2019 2030   UROBILINOGEN 0.2 03/20/2013 0227   NITRITE NEGATIVE 10/23/2019 2030   LEUKOCYTESUR MODERATE (A) 10/23/2019 2030   Urine Drug Screen No results found for: LABOPIA, COCAINSCRNUR, LABBENZ, AMPHETMU, THCU, LABBARB  Alcohol Level No results found for: [facility]   SIGNIFICANT DIAGNOSTIC STUDIES DG Chest Portable 1 View  Result Date: 10/23/2019 CLINICAL DATA:  Shortness of breath EXAM: PORTABLE CHEST 1 VIEW COMPARISON:  September 15, 2019 FINDINGS: The heart size and mediastinal contours are within normal limits. Aortic knob calcifications  are seen. Mildly increased interstitial markings seen at both lung bases as on prior exam, likely chronic lung changes. No large airspace consolidation or pleural effusion. No acute osseous abnormality. IMPRESSION: No active disease. Electronically Signed   By: Prudencio Pair M.D.   On: 10/23/2019 21:36      HISTORY OF PRESENT ILLNESS Elaine Owens is a 86 y.o. female with a history of myasthenia gravis, but I am not certain where was diagnosed, who is managed chronically on Mestinon who has been having increasing difficulty swallowing over the past few days.   HOSPITAL COURSE 2/5-2-7 admitted to ICU for MG exacerbation; started on IVIG for 5 days; continued on mestinon  and BIPAP. Code status DNR/ DNI 2/8: respiratory distress; relieved with BIPAP 2/11 made comfort care; palliative care involved   Malnutrition Type:  Nutrition Problem: Inadequate oral intake Etiology: inability to eat   Malnutrition Characteristics:  Signs/Symptoms: NPO status   Nutrition Interventions:  Interventions: Refer to RD note for recommendations    DISCHARGE EXAM Blood pressure (!) 143/60, pulse 72, temperature 97.8 F (36.6 C), temperature source Oral, resp. rate 17, weight 54.1 kg, SpO2 100 %.   Physical Exam  Constitutional: Appears well-developed and well-nourished.  Psych: Affect appropriate to situation Eyes: No scleral injection HENT: No OP obstrucion Head: Normocephalic.  Cardiovascular: Normal rate and regular rhythm.  Respiratory: shallow irregular breathing. Respirations 10. On @L  [place] GI: Soft.  No distension. There is no tenderness.  Skin: WDI  Neurological Examination Mental Status: Obtunded, does not respond to voice, does not follow commands. Cranial Nerves: Pupils 2 mm bilaterally Motor/ sensory: No purposeful movements noted.  Discharge Diet       Diet   Diet NPO time specified Except for: Ice Chips   liquids  DISCHARGE PLAN  Disposition:  Hospice [facility])  Medications per [facility]  30 minutes were spent preparing discharge.  Laurey Morale, MSN, NP-C [facility] [phone]

## 2019-11-16 DEATH — deceased
